# Patient Record
Sex: Female | Born: 1955 | Race: White | Hispanic: No | Smoking: Never smoker
Health system: Southern US, Community
[De-identification: ages and names within clinical notes are randomized; demographics above are authoritative.]

## PROBLEM LIST (undated history)

## (undated) DIAGNOSIS — N183 Chronic kidney disease, stage 3 (moderate): Secondary | ICD-10-CM

## (undated) DIAGNOSIS — L4050 Arthropathic psoriasis, unspecified: Secondary | ICD-10-CM

## (undated) DIAGNOSIS — R001 Bradycardia, unspecified: Secondary | ICD-10-CM

## (undated) DIAGNOSIS — F329 Major depressive disorder, single episode, unspecified: Secondary | ICD-10-CM

## (undated) DIAGNOSIS — D219 Benign neoplasm of connective and other soft tissue, unspecified: Secondary | ICD-10-CM

## (undated) DIAGNOSIS — G2581 Restless legs syndrome: Secondary | ICD-10-CM

## (undated) DIAGNOSIS — K219 Gastro-esophageal reflux disease without esophagitis: Secondary | ICD-10-CM

## (undated) DIAGNOSIS — K760 Fatty (change of) liver, not elsewhere classified: Secondary | ICD-10-CM

## (undated) DIAGNOSIS — G629 Polyneuropathy, unspecified: Secondary | ICD-10-CM

## (undated) DIAGNOSIS — I10 Essential (primary) hypertension: Secondary | ICD-10-CM

## (undated) DIAGNOSIS — D6859 Other primary thrombophilia: Secondary | ICD-10-CM

## (undated) DIAGNOSIS — M1990 Unspecified osteoarthritis, unspecified site: Secondary | ICD-10-CM

## (undated) DIAGNOSIS — D6851 Activated protein C resistance: Secondary | ICD-10-CM

## (undated) DIAGNOSIS — F32A Depression, unspecified: Secondary | ICD-10-CM

## (undated) HISTORY — DX: Other primary thrombophilia: D68.59

## (undated) HISTORY — DX: Polyneuropathy, unspecified: G62.9

## (undated) HISTORY — DX: Fatty (change of) liver, not elsewhere classified: K76.0

## (undated) HISTORY — DX: Restless legs syndrome: G25.81

## (undated) HISTORY — PX: LUMBAR SPINE SURGERY: SHX701

## (undated) HISTORY — DX: Arthropathic psoriasis, unspecified: L40.50

## (undated) HISTORY — DX: Unspecified osteoarthritis, unspecified site: M19.90

## (undated) HISTORY — PX: TUBAL LIGATION: SHX77

## (undated) HISTORY — DX: Gastro-esophageal reflux disease without esophagitis: K21.9

## (undated) HISTORY — DX: Major depressive disorder, single episode, unspecified: F32.9

## (undated) HISTORY — DX: Depression, unspecified: F32.A

## (undated) HISTORY — PX: VAGINAL HYSTERECTOMY: SUR661

## (undated) HISTORY — PX: OTHER SURGICAL HISTORY: SHX169

## (undated) HISTORY — PX: KNEE SURGERY: SHX244

## (undated) HISTORY — DX: Activated protein C resistance: D68.51

## (undated) HISTORY — DX: Essential (primary) hypertension: I10

## (undated) HISTORY — PX: CHOLECYSTECTOMY: SHX55

## (undated) HISTORY — DX: Chronic kidney disease, stage 3 (moderate): N18.3

## (undated) HISTORY — DX: Benign neoplasm of connective and other soft tissue, unspecified: D21.9

## (undated) HISTORY — PX: URETHRAL SLING: SHX2621

---

## 1998-07-08 ENCOUNTER — Ambulatory Visit (HOSPITAL_COMMUNITY): Admission: RE | Admit: 1998-07-08 | Discharge: 1998-07-08 | Payer: Self-pay | Admitting: Family Medicine

## 2000-02-11 ENCOUNTER — Encounter: Admission: RE | Admit: 2000-02-11 | Discharge: 2000-02-11 | Payer: Self-pay | Admitting: Family Medicine

## 2000-02-11 ENCOUNTER — Encounter: Payer: Self-pay | Admitting: Family Medicine

## 2003-04-12 ENCOUNTER — Ambulatory Visit (HOSPITAL_COMMUNITY): Admission: RE | Admit: 2003-04-12 | Discharge: 2003-04-12 | Payer: Self-pay | Admitting: Family Medicine

## 2003-11-20 ENCOUNTER — Emergency Department (HOSPITAL_COMMUNITY): Admission: EM | Admit: 2003-11-20 | Discharge: 2003-11-20 | Payer: Self-pay

## 2004-04-17 ENCOUNTER — Encounter: Admission: RE | Admit: 2004-04-17 | Discharge: 2004-04-17 | Payer: Self-pay | Admitting: Neurological Surgery

## 2004-05-02 ENCOUNTER — Ambulatory Visit (HOSPITAL_COMMUNITY): Admission: RE | Admit: 2004-05-02 | Discharge: 2004-05-02 | Payer: Self-pay | Admitting: Neurological Surgery

## 2005-03-30 ENCOUNTER — Emergency Department (HOSPITAL_COMMUNITY): Admission: EM | Admit: 2005-03-30 | Discharge: 2005-03-30 | Payer: Self-pay | Admitting: Family Medicine

## 2005-10-28 ENCOUNTER — Encounter: Admission: RE | Admit: 2005-10-28 | Discharge: 2005-10-28 | Payer: Self-pay | Admitting: Orthopedic Surgery

## 2006-01-25 ENCOUNTER — Ambulatory Visit (HOSPITAL_COMMUNITY): Admission: RE | Admit: 2006-01-25 | Discharge: 2006-01-26 | Payer: Self-pay | Admitting: Obstetrics & Gynecology

## 2006-01-25 ENCOUNTER — Encounter (INDEPENDENT_AMBULATORY_CARE_PROVIDER_SITE_OTHER): Payer: Self-pay | Admitting: *Deleted

## 2007-01-09 ENCOUNTER — Emergency Department (HOSPITAL_COMMUNITY): Admission: EM | Admit: 2007-01-09 | Discharge: 2007-01-09 | Payer: Self-pay | Admitting: Family Medicine

## 2007-01-10 ENCOUNTER — Emergency Department (HOSPITAL_COMMUNITY): Admission: EM | Admit: 2007-01-10 | Discharge: 2007-01-10 | Payer: Self-pay | Admitting: Emergency Medicine

## 2007-05-31 ENCOUNTER — Ambulatory Visit (HOSPITAL_COMMUNITY): Admission: RE | Admit: 2007-05-31 | Discharge: 2007-05-31 | Payer: Self-pay | Admitting: General Surgery

## 2007-06-18 ENCOUNTER — Emergency Department (HOSPITAL_COMMUNITY): Admission: EM | Admit: 2007-06-18 | Discharge: 2007-06-18 | Payer: Self-pay | Admitting: Emergency Medicine

## 2007-06-22 ENCOUNTER — Encounter: Admission: RE | Admit: 2007-06-22 | Discharge: 2007-06-22 | Payer: Self-pay | Admitting: General Surgery

## 2008-06-09 ENCOUNTER — Emergency Department (HOSPITAL_COMMUNITY): Admission: EM | Admit: 2008-06-09 | Discharge: 2008-06-09 | Payer: Self-pay | Admitting: Emergency Medicine

## 2008-09-25 ENCOUNTER — Encounter: Admission: RE | Admit: 2008-09-25 | Discharge: 2008-09-25 | Payer: Self-pay | Admitting: Family Medicine

## 2009-05-26 ENCOUNTER — Emergency Department (HOSPITAL_BASED_OUTPATIENT_CLINIC_OR_DEPARTMENT_OTHER): Admission: EM | Admit: 2009-05-26 | Discharge: 2009-05-26 | Payer: Self-pay | Admitting: Emergency Medicine

## 2009-05-26 ENCOUNTER — Ambulatory Visit: Payer: Self-pay | Admitting: Interventional Radiology

## 2009-06-14 ENCOUNTER — Encounter: Admission: RE | Admit: 2009-06-14 | Discharge: 2009-06-14 | Payer: Self-pay | Admitting: Family Medicine

## 2010-01-21 ENCOUNTER — Encounter: Admission: RE | Admit: 2010-01-21 | Discharge: 2010-01-21 | Payer: Self-pay | Admitting: Family Medicine

## 2010-02-20 ENCOUNTER — Ambulatory Visit: Payer: Self-pay | Admitting: Interventional Radiology

## 2010-02-20 ENCOUNTER — Emergency Department (HOSPITAL_BASED_OUTPATIENT_CLINIC_OR_DEPARTMENT_OTHER): Admission: EM | Admit: 2010-02-20 | Discharge: 2010-02-20 | Payer: Self-pay | Admitting: Emergency Medicine

## 2010-04-20 ENCOUNTER — Encounter: Payer: Self-pay | Admitting: Physical Medicine and Rehabilitation

## 2010-05-29 ENCOUNTER — Encounter: Payer: Self-pay | Admitting: Cardiology

## 2010-06-06 ENCOUNTER — Encounter: Payer: Self-pay | Admitting: Cardiology

## 2010-06-06 ENCOUNTER — Institutional Professional Consult (permissible substitution) (INDEPENDENT_AMBULATORY_CARE_PROVIDER_SITE_OTHER): Payer: BC Managed Care – PPO | Admitting: Cardiology

## 2010-06-06 DIAGNOSIS — M1990 Unspecified osteoarthritis, unspecified site: Secondary | ICD-10-CM | POA: Insufficient documentation

## 2010-06-06 DIAGNOSIS — G2581 Restless legs syndrome: Secondary | ICD-10-CM | POA: Insufficient documentation

## 2010-06-06 DIAGNOSIS — IMO0001 Reserved for inherently not codable concepts without codable children: Secondary | ICD-10-CM | POA: Insufficient documentation

## 2010-06-06 DIAGNOSIS — R079 Chest pain, unspecified: Secondary | ICD-10-CM | POA: Insufficient documentation

## 2010-06-06 DIAGNOSIS — I10 Essential (primary) hypertension: Secondary | ICD-10-CM | POA: Insufficient documentation

## 2010-06-06 DIAGNOSIS — J309 Allergic rhinitis, unspecified: Secondary | ICD-10-CM | POA: Insufficient documentation

## 2010-06-06 DIAGNOSIS — R0602 Shortness of breath: Secondary | ICD-10-CM

## 2010-06-06 DIAGNOSIS — F3289 Other specified depressive episodes: Secondary | ICD-10-CM | POA: Insufficient documentation

## 2010-06-06 DIAGNOSIS — K7689 Other specified diseases of liver: Secondary | ICD-10-CM | POA: Insufficient documentation

## 2010-06-06 DIAGNOSIS — R609 Edema, unspecified: Secondary | ICD-10-CM | POA: Insufficient documentation

## 2010-06-06 DIAGNOSIS — K219 Gastro-esophageal reflux disease without esophagitis: Secondary | ICD-10-CM | POA: Insufficient documentation

## 2010-06-06 DIAGNOSIS — F329 Major depressive disorder, single episode, unspecified: Secondary | ICD-10-CM | POA: Insufficient documentation

## 2010-06-10 ENCOUNTER — Telehealth: Payer: Self-pay | Admitting: Cardiology

## 2010-06-10 NOTE — Assessment & Plan Note (Signed)
Summary: consult: left sided - rib pain. pt has bcbs. per ingrid  note...   Visit Type:  Initial Consult Primary Provider:  Dr. Laurann Montana  CC:  Dyspnea.  History of Present Illness: The patient presents for evaluation of dyspnea.She had an extensive evaluation last year. I reviewed this. She had an echocardiogram which demonstrated by report no gout no use and normal left ventricular function. There was some mild asymmetric septal hypertrophy. A stress perfusion study was reported as normal. She was treated with a higher dose of diuretic and had initial improvement in dyspnea and edema. However, she subsequently has had a return of this complaint over the past week or so.  past she has increased dyspnea with activities and is resting pushing a cart a box at work. She has also had some increased lower extremity swelling although she does admit to not restricting salt or fluids and to keeping her feet down. She is not however describing any chest pressure, neck or arm discomfort. She is not having any palpitations, presyncope or syncope. She is not having PND or orthopnea. He denies any cough fevers or chills.  Current Medications (verified): 1)  Furosemide 40 Mg Tabs (Furosemide) .Marland Kitchen.. 1 By Mouth Daily 2)  Klor-Con M20 20 Meq Cr-Tabs (Potassium Chloride Crys Cr) .Marland Kitchen.. 1 By Mouth Two Times A Day 3)  Lisinopril-Hydrochlorothiazide 20-12.5 Mg Tabs (Lisinopril-Hydrochlorothiazide) .Marland Kitchen.. 1 By Mouth Dialy 4)  Multivitamins   Tabs (Multiple Vitamin) .Marland Kitchen.. 1 By Mouth Daily 5)  Calcium 500 Mg Tabs (Calcium) .Marland Kitchen.. 1 By Mouth Daily 6)  Protonix 40 Mg Tbec (Pantoprazole Sodium) .Marland Kitchen.. 1 By Mouth Daily 7)  Requip 3 Mg Tabs (Ropinirole Hcl) .Marland Kitchen.. 1 By Mouth Daily 8)  Vitamin D 1000 Unit Tabs (Cholecalciferol) .Marland Kitchen.. 1 By Mouth Dialy 9)  Prozac .... Uad 10)  Prednisone 5 Mg Tabs (Prednisone) .Marland Kitchen.. 1 1/2 By Mouth Daily  Allergies (verified): No Known Drug Allergies  Past History:  Past Medical  History: Depression Hypertension Allergis rhinitis dysfunction uterine bleeding/fibroids GERD RLS Osteoarthritis- bilateral hips, thoracic spine Fatty liver Temporal Arteritis Myofacial pain syndrome  Past Surgical History: Rectum fistula repair Bilateral tubaligation Lumbar surgery Laparascopic cholecystectomy Hysterectomy with bilateral oophorectomy Urethral sling Arthroscopic right knee surgery  Family History: Father deceased CAD in mid 61's, TIAs in mid 14's Mother: alive hypertension , temporal arteritis, osteoporosis, peripheral neuropathy, DVT in 62's  Social History: Patient is a Chief Financial Officer. He is married with 2 children. She does not smoke cigarettes or drink alcohol.  Review of Systems       Positive for headaches and reflux. Otherwise as stated in the history of present illness negative for all other systems.  Vital Signs:  Patient profile:   55 year old female Height:      65 inches Weight:      260 pounds BMI:     43.42 Pulse rate:   105 / minute Resp:     16 per minute BP sitting:   119 / 71  (left arm)  Vitals Entered By: Marrion Coy, CNA (June 06, 2010 2:52 PM)  Physical Exam  General:  Well developed, well nourished, in no acute distress. Head:  normocephalic and atraumatic Eyes:  PERRLA/EOM intact; conjunctiva and lids normal. Mouth:  Teeth, gums and palate normal. Oral mucosa normal. Neck:  Neck supple, no JVD. No masses, thyromegaly or abnormal cervical nodes. Chest Wall:  no deformities or breast masses noted Lungs:  Clear bilaterally to auscultation and percussion. Abdomen:  Bowel sounds positive; abdomen soft and non-tender without masses, organomegaly, or hernias noted. No hepatosplenomegaly, obese Msk:  Back normal, normal gait. Muscle strength and tone normal. Extremities:  No clubbing or cyanosis. Neurologic:  Alert and oriented x 3. Skin:  Intact without lesions or rashes. Cervical Nodes:  no significant  adenopathy Inguinal Nodes:  no significant adenopathy Psych:  Normal affect.   Detailed Cardiovascular Exam  Neck    Carotids: Carotids full and equal bilaterally without bruits.      Neck Veins: Normal, no JVD.    Heart    Inspection: no deformities or lifts noted.      Palpation: normal PMI with no thrills palpable.      Auscultation: regular rate and rhythm, S1, S2 without murmurs, rubs, gallops, or clicks.    Vascular    Abdominal Aorta: no palpable masses, pulsations, or audible bruits.      Femoral Pulses: normal femoral pulses bilaterally.      Pedal Pulses: normal pedal pulses bilaterally.      Radial Pulses: normal radial pulses bilaterally.      Peripheral Circulation: no clubbing, cyanosis, or edema noted with normal capillary refill.     EKG  Procedure date:  06/06/2010  Findings:      Sinus rhythm, rate 102, axis within normal limits, intervals within normal limits, no acute ST-T wave changes.  Impression & Recommendations:  Problem # 1:  DYSPNEA (ICD-786.05) The patient most likely has some component of diastolic dysfunction and some component related to her weight and deconditioning. There is no indication or need for further echocardiography or stress testing. We did discuss in great length conservative measures to include salt and fluid restriction. I gave her prescription for knee-high compression stockings and discussed how she should keep her feet elevated. I discussed weight loss. I will have her take an extra 40 mg of Lasix for 2 days with an X. 20 mEq of potassium. I will have her come back early next week for a basic metabolic profile and BNP  Problem # 2:  EDEMA (ICD-782.3) This will be treated as above.  Problem # 3:  MORBID OBESITY (ICD-278.01) I prescribed the [employer].  Other Orders: EKG w/ Interpretation (93000)  Patient Instructions: 1)  Your physician recommends that you schedule a follow-up appointment in: 6 weeks with Dr.  Antoine Poche 2)  Your physician recommends that you return for lab work next week for a bmp, bnp 786.50/dfg 3)  Your physician has recommended you make the following change in your medication: Take 1 furosemide twice a day for 2 days and take an extra potassium for 2 days Then back to daily as before. 4)  compression stockings Prescriptions: FUROSEMIDE 40 MG TABS (FUROSEMIDE) 1 tablet daily Take an extra furosemide today and tomorrow  #92 x 3   Entered by:   Lisabeth Devoid RN   Authorized by:   Rollene Rotunda, MD, [facility]   Signed by:   Lisabeth Devoid RN on 06/06/2010   Method used:   Electronically to        [facility] (retail)       [address]       [place], [place]  [address]       Ph: [phone]       Fax: [phone]   RxID:   [id number] KLOR-CON M20 20 MEQ CR-TABS (POTASSIUM CHLORIDE CRYS CR) 1 by mouth two times a day  Take an extra potassium for 2 days  #182 x 3   Entered by:   Lisabeth Devoid RN   Authorized by:   Rollene Rotunda, MD, [facility]   Signed by:   Lisabeth Devoid RN on 06/06/2010   Method used:   Electronically to        [facility] (retail)       [address]       [place], [place]  [address]       Ph: [phone]       Fax: [phone]   RxID:   [id number]  I have reviewed and approved all prescriptions at the time of this visit. Rollene Rotunda, MD, [facility]  June 06, 2010 6:32 PM

## 2010-06-11 ENCOUNTER — Other Ambulatory Visit: Payer: BC Managed Care – PPO

## 2010-06-17 NOTE — Progress Notes (Signed)
Summary: c/o dizziness lasix was on hold sat/ mon  Phone Note Call from Patient Call back at [facility] Phone [phone]   Caller: Patient ext [phone] Reason for Call: Talk to Nurse Summary of Call: c/o dizziness, weak. lasix is increase. pt stop lasix on sat/ sun/ mon/ tues.   Initial call taken by: Lorne Skeens,  June 10, 2010 10:04 AM  Follow-up for Phone Call        spoke with pt who states she has been having dizziness and weakness since Saturday.  Her BP has been running in the low 100/low 60's since then.  Pt has not taken Furosemide since Saturday d/t dizziness.  SHe has not been wearing her compression stockings as ordered.  In fact she has not purchased them as of yet.  Strongly encoaraged pt to obtain and wear compression stockings as ordered.  Information given as to where pt can purchase stockings close to her work as she is not able to  get to [place] during the week to buy them.  Instructed pt to conitnue to hold Furosemide as long as she is symptomatic and not swelling but that she needed to get back on it as soon as possible.  Also instructed pt to call back after she has been wearing compression stockings as ordered if there is no improvement.  SHe states understanding and is in agreement. Follow-up by: Charolotte Capuchin, RN,  June 10, 2010 10:37 AM

## 2010-06-19 LAB — DIFFERENTIAL
Basophils Relative: 0 % (ref 0–1)
Eosinophils Absolute: 0 10*3/uL (ref 0.0–0.7)
Lymphs Abs: 1.6 10*3/uL (ref 0.7–4.0)
Monocytes Absolute: 0.2 10*3/uL (ref 0.1–1.0)
Monocytes Relative: 2 % — ABNORMAL LOW (ref 3–12)
Neutrophils Relative %: 83 % — ABNORMAL HIGH (ref 43–77)

## 2010-06-19 LAB — CBC
HCT: 40 % (ref 36.0–46.0)
Hemoglobin: 13.5 g/dL (ref 12.0–15.0)
MCHC: 33.7 g/dL (ref 30.0–36.0)
MCV: 92.1 fL (ref 78.0–100.0)
RBC: 4.34 MIL/uL (ref 3.87–5.11)
WBC: 10.6 10*3/uL — ABNORMAL HIGH (ref 4.0–10.5)

## 2010-06-19 LAB — POCT CARDIAC MARKERS
Myoglobin, poc: 69.4 ng/mL (ref 12–200)
Troponin i, poc: <0.05 ng/mL (ref 0.00–0.09)
Troponin i, poc: <0.05 ng/mL (ref 0.00–0.09)

## 2010-06-19 LAB — BASIC METABOLIC PANEL
CO2: 28 mEq/L (ref 19–32)
Chloride: 104 mEq/L (ref 96–112)
Creatinine, Ser: 1 mg/dL (ref 0.4–1.2)
GFR calc Af Amer: >60 mL/min (ref >60)
Potassium: 3.1 mEq/L — ABNORMAL LOW (ref 3.5–5.1)
Sodium: 141 mEq/L (ref 135–145)

## 2010-06-23 ENCOUNTER — Telehealth: Payer: Self-pay | Admitting: Cardiology

## 2010-06-23 DIAGNOSIS — I10 Essential (primary) hypertension: Secondary | ICD-10-CM

## 2010-06-23 DIAGNOSIS — R0602 Shortness of breath: Secondary | ICD-10-CM

## 2010-06-23 DIAGNOSIS — R609 Edema, unspecified: Secondary | ICD-10-CM

## 2010-06-23 NOTE — Telephone Encounter (Signed)
SPOKE WITH PT AND SET UP LABS--BMET/BNP--NT

## 2010-06-23 NOTE — Telephone Encounter (Signed)
Pt wants to have blood work done before her appt in April. Pt [phone] ext [phone]

## 2010-06-24 ENCOUNTER — Other Ambulatory Visit (INDEPENDENT_AMBULATORY_CARE_PROVIDER_SITE_OTHER): Payer: BC Managed Care – PPO | Admitting: *Deleted

## 2010-06-24 DIAGNOSIS — I10 Essential (primary) hypertension: Secondary | ICD-10-CM

## 2010-06-24 DIAGNOSIS — R0602 Shortness of breath: Secondary | ICD-10-CM

## 2010-06-24 DIAGNOSIS — R609 Edema, unspecified: Secondary | ICD-10-CM

## 2010-06-24 LAB — BASIC METABOLIC PANEL
BUN: 20 mg/dL (ref 6–23)
CO2: 29 mEq/L (ref 19–32)
Calcium: 8.9 mg/dL (ref 8.4–10.5)
Chloride: 104 mEq/L (ref 96–112)
Creatinine, Ser: 1.2 mg/dL (ref 0.4–1.2)

## 2010-06-25 ENCOUNTER — Other Ambulatory Visit: Payer: BC Managed Care – PPO | Admitting: *Deleted

## 2010-06-25 NOTE — Progress Notes (Signed)
Pt aware of results and aware of new orders.  She will repeat labs Tuesday of next week

## 2010-07-01 ENCOUNTER — Other Ambulatory Visit (INDEPENDENT_AMBULATORY_CARE_PROVIDER_SITE_OTHER): Payer: BC Managed Care – PPO | Admitting: *Deleted

## 2010-07-01 DIAGNOSIS — Z79899 Other long term (current) drug therapy: Secondary | ICD-10-CM

## 2010-07-01 DIAGNOSIS — I10 Essential (primary) hypertension: Secondary | ICD-10-CM

## 2010-07-02 ENCOUNTER — Other Ambulatory Visit: Payer: BC Managed Care – PPO | Admitting: *Deleted

## 2010-07-02 LAB — BASIC METABOLIC PANEL
BUN: 17 mg/dL (ref 6–23)
Calcium: 9.1 mg/dL (ref 8.4–10.5)
GFR: 58.66 mL/min — ABNORMAL LOW (ref >60.00)
Glucose, Bld: 93 mg/dL (ref 70–99)

## 2010-07-10 LAB — POCT URINALYSIS DIP (DEVICE)
Glucose, UA: NEGATIVE mg/dL
Nitrite: NEGATIVE
Protein, ur: NEGATIVE mg/dL
Urobilinogen, UA: 0.2 mg/dL (ref 0.0–1.0)

## 2010-07-14 ENCOUNTER — Ambulatory Visit (INDEPENDENT_AMBULATORY_CARE_PROVIDER_SITE_OTHER): Payer: BC Managed Care – PPO | Admitting: Cardiology

## 2010-07-14 ENCOUNTER — Encounter: Payer: Self-pay | Admitting: Cardiology

## 2010-07-14 VITALS — BP 133/88 | HR 88 | Resp 18 | Ht 64.0 in | Wt 263.8 lb

## 2010-07-14 DIAGNOSIS — I10 Essential (primary) hypertension: Secondary | ICD-10-CM

## 2010-07-14 DIAGNOSIS — R609 Edema, unspecified: Secondary | ICD-10-CM

## 2010-07-14 LAB — BASIC METABOLIC PANEL
CO2: 28 mEq/L (ref 19–32)
Calcium: 8.8 mg/dL (ref 8.4–10.5)
Chloride: 102 mEq/L (ref 96–112)
Creatinine, Ser: 1 mg/dL (ref 0.4–1.2)
Sodium: 140 mEq/L (ref 135–145)

## 2010-07-14 NOTE — Assessment & Plan Note (Signed)
We are treating this conservatively and no change in therapy is indicated.

## 2010-07-14 NOTE — Progress Notes (Signed)
HPI The patient presents for followup of edema and dyspnea. This was felt to be related to deconditioning and weight. BNP was in the 20s recently. She was treated with symmetric diuretic for a short period and is now wearing compression stockings. Her breathing is slightly better and her edema is better. She has lost 4 pounds. She is having no new chest discomfort, neck or arm discomfort. She is having no new PND or orthopnea. She is having no new palpitations, presyncope or syncope.  No Known Allergies  Current Outpatient Prescriptions  Medication Sig Dispense Refill  . calcium gluconate 500 MG tablet Take 500 mg by mouth daily.        . Cholecalciferol (VITAMIN D) 1000 UNITS capsule Take 1,000 Units by mouth daily.        Marland Kitchen FLUoxetine (PROZAC) 10 MG capsule Take 20 mg by mouth daily.       . furosemide (LASIX) 40 MG tablet Take 40 mg by mouth daily.        Marland Kitchen lisinopril-hydrochlorothiazide (PRINZIDE,ZESTORETIC) 20-12.5 MG per tablet Take 1 tablet by mouth daily.        . Multiple Vitamin (MULTIVITAMIN) tablet Take 1 tablet by mouth daily.        . pantoprazole (PROTONIX) 40 MG tablet Take 40 mg by mouth daily.        . potassium chloride SA (K-DUR,KLOR-CON) 20 MEQ tablet Take 20 mEq by mouth 2 (two) times daily.        Marland Kitchen rOPINIRole (REQUIP) 3 MG tablet Take 3 mg by mouth at bedtime.        . predniSONE (DELTASONE) 5 MG tablet Take 5 mg by mouth. Take 1 1/2 tablet daily         Past Medical History  Diagnosis Date  . Depression   . Hypertension   . Allergic rhinitis   . Fibroids     dysfuction uterine bleeding  . GERD (gastroesophageal reflux disease)   . RLS (restless legs syndrome)   . Osteoarthritis   . Fatty liver   . Hx of hysterectomy     Past Surgical History  Procedure Date  . Rectu   . Tubal ligation     bilateral  . Lumbar spine surgery   . Cholecystectomy   . Urethral sling   . Knee surgery     right   ROS: As stated in the HPI and negative for all other  systems.  PHYSICAL EXAM BP 133/88  Pulse 88  Resp 18  Ht 5\' 4"  (1.626 m)  Wt 263 lb 12.8 oz (119.659 kg)  BMI 45.28 kg/m2 GENERAL:  Well appearing HEENT:  Pupils equal round and reactive, fundi not visualized, oral mucosa unremarkable NECK:  No jugular venous distention, waveform within normal limits, carotid upstroke brisk and symmetric, no bruits, no thyromegaly LYMPHATICS:  No cervical, inguinal adenopathy LUNGS:  Clear to auscultation bilaterally BACK:  No CVA tenderness CHEST:  Unremarkable HEART:  PMI not displaced or sustained,S1 and S2 within normal limits, no S3, no S4, no clicks, no rubs, no murmurs ABD:  Flat, positive bowel sounds normal in frequency in pitch, no bruits, no rebound, no guarding, no midline pulsatile mass, no hepatomegaly, no splenomegaly EXT:  2 plus pulses throughout, mild edema, no cyanosis no clubbing SKIN:  No rashes no nodules NEURO:  Cranial nerves II through XII grossly intact, motor grossly intact throughout PSYCH:  Cognitively intact, oriented to person place and time  EKG:  Sinus rhythm, rate 85, axis  within normal limits, intervals within normal limits, no acute ST-T wave changes  ASSESSMENT AND PLAN

## 2010-07-14 NOTE — Assessment & Plan Note (Signed)
The patient understands the need to lose weight with diet and exercise. We have discussed specific strategies for this.  

## 2010-07-14 NOTE — Patient Instructions (Signed)
Your physician recommends that you have lab work today: [facility]  Your physician recommends that you schedule a follow-up appointment as needed.  Your physician recommends that you continue on your current medications as directed. Please refer to the Current Medication list given to you today.

## 2010-07-14 NOTE — Assessment & Plan Note (Signed)
The blood pressure is at target. No change in medications is indicated. We will continue with therapeutic lifestyle changes (TLC).  

## 2010-07-15 ENCOUNTER — Telehealth: Payer: Self-pay | Admitting: Cardiology

## 2010-07-15 NOTE — Telephone Encounter (Signed)
Left message for pt to call back  °

## 2010-07-15 NOTE — Telephone Encounter (Signed)
Pt calling back re message left with husband yesterday re some orders she needs due to potassium being low

## 2010-07-17 NOTE — Telephone Encounter (Signed)
Pt's husband aware of instructions and an appointment was made for pt to repeat labs 07/24/2010.  Husband verbalized understanding of all changes and need for lab work

## 2010-07-24 ENCOUNTER — Other Ambulatory Visit (INDEPENDENT_AMBULATORY_CARE_PROVIDER_SITE_OTHER): Payer: BC Managed Care – PPO | Admitting: *Deleted

## 2010-07-24 DIAGNOSIS — I10 Essential (primary) hypertension: Secondary | ICD-10-CM

## 2010-07-24 LAB — BASIC METABOLIC PANEL
BUN: 15 mg/dL (ref 6–23)
Chloride: 102 mEq/L (ref 96–112)
GFR: 54.94 mL/min — ABNORMAL LOW (ref >60.00)
Potassium: 3.3 mEq/L — ABNORMAL LOW (ref 3.5–5.1)
Sodium: 139 mEq/L (ref 135–145)

## 2010-07-28 ENCOUNTER — Telehealth: Payer: Self-pay | Admitting: *Deleted

## 2010-07-28 DIAGNOSIS — I10 Essential (primary) hypertension: Secondary | ICD-10-CM

## 2010-07-28 NOTE — Progress Notes (Signed)
Left message for pt to call to schedule appt for lab work

## 2010-07-29 NOTE — Telephone Encounter (Signed)
complete

## 2010-07-31 ENCOUNTER — Emergency Department (HOSPITAL_COMMUNITY)
Admission: EM | Admit: 2010-07-31 | Discharge: 2010-07-31 | Disposition: A | Discharge status: Home / Self Care | Payer: BC Managed Care – PPO | Attending: Emergency Medicine | Admitting: Emergency Medicine

## 2010-07-31 ENCOUNTER — Other Ambulatory Visit (INDEPENDENT_AMBULATORY_CARE_PROVIDER_SITE_OTHER): Payer: BC Managed Care – PPO | Admitting: *Deleted

## 2010-07-31 DIAGNOSIS — K219 Gastro-esophageal reflux disease without esophagitis: Secondary | ICD-10-CM | POA: Insufficient documentation

## 2010-07-31 DIAGNOSIS — K625 Hemorrhage of anus and rectum: Secondary | ICD-10-CM | POA: Insufficient documentation

## 2010-07-31 DIAGNOSIS — R197 Diarrhea, unspecified: Secondary | ICD-10-CM | POA: Insufficient documentation

## 2010-07-31 DIAGNOSIS — I10 Essential (primary) hypertension: Secondary | ICD-10-CM

## 2010-07-31 DIAGNOSIS — R000 Tachycardia, unspecified: Secondary | ICD-10-CM | POA: Insufficient documentation

## 2010-07-31 DIAGNOSIS — F3289 Other specified depressive episodes: Secondary | ICD-10-CM | POA: Insufficient documentation

## 2010-07-31 DIAGNOSIS — M129 Arthropathy, unspecified: Secondary | ICD-10-CM | POA: Insufficient documentation

## 2010-07-31 DIAGNOSIS — Z79899 Other long term (current) drug therapy: Secondary | ICD-10-CM | POA: Insufficient documentation

## 2010-07-31 DIAGNOSIS — K644 Residual hemorrhoidal skin tags: Secondary | ICD-10-CM | POA: Insufficient documentation

## 2010-07-31 DIAGNOSIS — F329 Major depressive disorder, single episode, unspecified: Secondary | ICD-10-CM | POA: Insufficient documentation

## 2010-07-31 LAB — COMPREHENSIVE METABOLIC PANEL
ALT: 11 U/L (ref 0–35)
AST: 21 U/L (ref 0–37)
BUN: 13 mg/dL (ref 6–23)
CO2: 26 mEq/L (ref 19–32)
Calcium: 9.4 mg/dL (ref 8.4–10.5)
Chloride: 110 mEq/L (ref 96–112)
Creatinine, Ser: 1.02 mg/dL (ref 0.4–1.2)
Creatinine, Ser: 1.12 mg/dL (ref 0.4–1.2)
GFR calc Af Amer: >60 mL/min (ref >60)
GFR calc non Af Amer: 50 mL/min — ABNORMAL LOW (ref >60)
Glucose, Bld: 92 mg/dL (ref 70–99)
Glucose, Bld: 99 mg/dL (ref 70–99)
Total Bilirubin: 0.1 mg/dL — ABNORMAL LOW (ref 0.3–1.2)
Total Protein: 6.2 g/dL (ref 6.0–8.3)

## 2010-07-31 LAB — CBC
HCT: 37.7 % (ref 36.0–46.0)
Hemoglobin: 12.6 g/dL (ref 12.0–15.0)
MCH: 30.3 pg (ref 26.0–34.0)
MCHC: 33.4 g/dL (ref 30.0–36.0)
RBC: 4.16 MIL/uL (ref 3.87–5.11)

## 2010-07-31 LAB — URINALYSIS, ROUTINE W REFLEX MICROSCOPIC
Bilirubin Urine: NEGATIVE
Ketones, ur: NEGATIVE mg/dL
Nitrite: NEGATIVE
Urobilinogen, UA: 0.2 mg/dL (ref 0.0–1.0)

## 2010-07-31 LAB — DIFFERENTIAL
Basophils Relative: 0 % (ref 0–1)
Lymphocytes Relative: 29 % (ref 12–46)
Lymphs Abs: 2.3 10*3/uL (ref 0.7–4.0)
Monocytes Absolute: 0.6 10*3/uL (ref 0.1–1.0)
Monocytes Relative: 7 % (ref 3–12)
Neutro Abs: 5.1 10*3/uL (ref 1.7–7.7)
Neutrophils Relative %: 63 % (ref 43–77)

## 2010-07-31 LAB — BASIC METABOLIC PANEL
CO2: 25 mEq/L (ref 19–32)
Chloride: 102 mEq/L (ref 96–112)
Creatinine, Ser: 1 mg/dL (ref 0.4–1.2)
Potassium: 3 mEq/L — ABNORMAL LOW (ref 3.5–5.1)
Sodium: 137 mEq/L (ref 135–145)

## 2010-08-01 ENCOUNTER — Telehealth: Payer: Self-pay | Admitting: Cardiology

## 2010-08-01 DIAGNOSIS — I10 Essential (primary) hypertension: Secondary | ICD-10-CM

## 2010-08-01 DIAGNOSIS — Z79899 Other long term (current) drug therapy: Secondary | ICD-10-CM

## 2010-08-01 NOTE — Telephone Encounter (Signed)
Pt calling wanting explanation for potassium level going up and down?--do I have to stay on this high dose of K forever?--why does it keep going down with me taking it everyday faithfully?--advised I would have dr hochrein or pam call her with answers to these questions--nt

## 2010-08-01 NOTE — Telephone Encounter (Signed)
LMTCB--nt 

## 2010-08-01 NOTE — Telephone Encounter (Signed)
Pt went to ER yesterday because of bleeding.  Pot was 4.0 at hospital, When lab was taken at our office potassium but pt had not taken medication that am.  Has question regarding the accuracy of the labs.

## 2010-08-02 NOTE — Telephone Encounter (Signed)
The patient will need to be on KCl.  This will likely be a requirement while she is on diuretic.  We can discontinue the HCTZ.  She will still need close follow up of her potassium.  Is her diet varying in the amount of potassium that she consumes?

## 2010-08-04 NOTE — Telephone Encounter (Signed)
When should pt repeat BMP?

## 2010-08-04 NOTE — Telephone Encounter (Signed)
Left message for pt of Dr Hochrein's instructions.  Asked pt to call back with questions.  I will ask Dr Antoine Poche when he wants to re-check KCL level on pt.

## 2010-08-04 NOTE — Telephone Encounter (Signed)
Repeat in one month.

## 2010-08-05 NOTE — Telephone Encounter (Signed)
Left message for pt to repeat BMP in 1 month

## 2010-08-07 ENCOUNTER — Inpatient Hospital Stay (INDEPENDENT_AMBULATORY_CARE_PROVIDER_SITE_OTHER)
Admission: RE | Admit: 2010-08-07 | Discharge: 2010-08-07 | Disposition: A | Discharge status: Home / Self Care | Payer: BC Managed Care – PPO | Source: Ambulatory Visit | Attending: Family Medicine | Admitting: Family Medicine

## 2010-08-07 DIAGNOSIS — M545 Low back pain: Secondary | ICD-10-CM

## 2010-08-07 LAB — POCT URINALYSIS DIP (DEVICE)
Glucose, UA: NEGATIVE mg/dL
Protein, ur: 30 mg/dL — AB
Specific Gravity, Urine: 1.025 (ref 1.005–1.030)

## 2010-08-15 NOTE — Op Note (Signed)
NAMECATHY, CROUNSE NO.:  [id number]   MEDICAL RECORD NO.:  [id number]          PATIENT TYPE:  OIB   LOCATION:  2899                         FACILITY:  [facility]   PHYSICIAN:  Tia Alert, MD     DATE OF BIRTH:  1955/10/30   DATE OF PROCEDURE:  05/02/2004  DATE OF DISCHARGE:                                 OPERATIVE REPORT   REFERRING PHYSICIAN:  Stacie Acres. Cliffton Asters, M.D.   PREOPERATIVE DIAGNOSIS:  Lumbar spinal stenosis at L4-5 with right leg pain.   POSTOPERATIVE DIAGNOSIS:  Lumbar spinal stenosis at L4-5 with right leg  pain.   PROCEDURES:  Decompressive lumbar hemilaminectomy, medial facetectomy and  foraminotomy L4-5 on the right for lateral recess and L5 nerve root  decompression.   SURGEON:  Tia Alert, M.D.   ASSISTANT:  Kathaleen Maser. Pool, M.D.   ANESTHESIA:  General endotracheal.   COMPLICATIONS:  None apparent.   INDICATIONS FOR PROCEDURE:  Ms. Filla is a  55 year old white female who  was referred with back and right leg pain and L5 distribution. She had an  MRI and CT myelogram which showed some lateral recess stenosis at L4-5 on  the right side with compression of the right L5 nerve root.  I recommended a  hemilaminectomy for decompression of the nerve roots.  She understood the  risks, benefits and alternatives and  which wished to proceed.   DESCRIPTION OF PROCEDURE:  The patient is taken to the operating room after  induction of adequate generalized endotracheal anesthesia, she was rolled  into the prone position on the Wilson frame and all pressure points were  padded. Her lumbar region was prepped with DuraPrep and draped in the usual  sterile fashion. There were 10 mL of local anesthesia injected and then a  dorsal midline incision was made and carried down to the lumbosacral fascia.  The fascia on the right side was taken down.  It was opened in the  paraspinous musculature which was taken down in a subperiosteal fashion to  expose  L4-5 interspace on the right. Intraoperative x-ray confirmed my level  and a combination of the Kerrison punch and the high-speed drill was used to  perform a hemilaminectomy, medial facetectomy and foraminotomy at L4-5 on  the right side. The yellow ligament was identified, opened and removed to  expose the underlying dura and L5 nerve root.  The lateral recess was  decompressed. There was significant overgrown yellow ligament pressing on  the nerve root which was removed.  I dissected out to the medial pedicle  wall and I then used coronary dilator to fell along the pedicle to make sure  there was adequate decompression L5 nerve root.  Once the depression was  complete, I irrigated with copious amounts of bacitracin containing saline  solution, lined the dura with Gelfoam, dried all bleeding points and closed  the fascia with interrupted #1 Vicryl, closed subcutaneous and subcuticular  tissue with 2-0 and 3-0 Vicryl and closed the skin  with Dermabond. The drapes removed. The patient was awakened from general  anesthesia and transferred to the  recovery room in stable condition.  At the  end of the procedure all sponge, needle and instrument counts were correct.      DSJ/MEDQ  D:  05/02/2004  T:  05/02/2004  Job:  [id number]   cc:   Stacie Acres. White, M.D.  [address]  [place]  [place] [address]  Fax: [phone]

## 2010-08-15 NOTE — Op Note (Signed)
[name] [name], [name] [name]             ACCOUNT NO.:  [id number]   MEDICAL RECORD NO.:  [id number]          PATIENT TYPE:  OIB   LOCATION:  9320                          FACILITY:  [facility]   PHYSICIAN:  Genia Del, M.D.DATE OF BIRTH:  28-Nov-1955   DATE OF PROCEDURE:  01/25/2006  DATE OF DISCHARGE:                                 OPERATIVE REPORT   PREOPERATIVE DIAGNOSIS:  Menometrorrhagia with uterine myomas and  dysmenorrhea.   POSTOPERATIVE DIAGNOSIS:  Menometrorrhagia with uterine myomas and  dysmenorrhea.   PROCEDURE:  Laparoscopy-assisted vaginal hysterectomy plus bilateral  salpingo-oophorectomy.   SURGEON:  Genia Del, M.D.   ASSISTANT:  Lenoard Aden, M.D.   ANESTHESIOLOGIST:  Cristela Blue, M.D.   PROCEDURE:  Under general anesthesia with endotracheal intubation, the  patient is in the lithotomy position.  She was prepped already over  abdominal suprapubic vulvar and vaginal areas and draped.  A bladder  catheter was already in place, given that the patient had a sling procedure  done first by Dr. Retta Diones and a lap cholecystectomy by Dr. Johna Sheriff.  The  trocars were left in place from the lap cholecystectomy, and the  infraumbilical trocar was used for the LAVH/BSO.  So, vaginally, a speculum  was introduced.  A tenaculum was used to grasp the anterior lip of the  cervix.  We then did a hysteromenometry that came back at 9 cm.  Hegar  dilators were used to dilate the cervix for introduction of the zoomie.  The  zoomie was introduced easily.  The balloon was inflated.  The tenaculum and  speculum were then removed.  The camera was introduced at the infraumbilical  trocar, and the patient was placed in Trendelenburg.  The uterus is  anteverted with a large posterior intramural fibroid.  The left ovary is  normal in size and appearance.  The right ovary is normal in size and  appearance.  A paratubal cyst is present on that adnexa.  Both tubes have  had  previous tubal sterilization.  We infiltrated the subcutaneous tissue on  the left and right iliac sides with Marcaine one-quarter plain.  We then  made a 5 mm incision with a scalpel on each side and introduced 5 mm trocars  at both levels.  An atraumatic clamp and the gyrus were used to proceed with  the surgery.  We started on the left side.  We cauterized and sectioned the  left round ligament.  We then cauterized and sectioned the left  infundibulopelvic ligament, staying very close to the ovary because of  difficult visualization of the ureter.  We then went down along the lateral  side of the uterus and cauterized in sections, stopping before the left  uterine artery.  We opened the visceroperitoneum over the lower uterine  segment, and we recline the bladder downward.  We proceeded exactly the same  way on the right side.  We then assured good hemostasis at all levels.  We  removed the instruments, put a drape on the abdomen, and went to  vaginal  time .  The legs were positioned upward.  We  removed the zoomie and grasped  the cervix with two Hulka tenaculums.  We infiltrated all around the cervix  at the junction of his vagina with vasopressin 20 and 50.  We then sectioned  that level with the actual cautery.  We opened the peritoneum sterilely  first and put a weighted speculum at that level.  We then used the Heaney  curved clamp to clamp the left uterosacral and cardinal ligaments.  We  sectioned with Mayo scissors and put a Heaney stitch of 0 Vicryl that we  kept on a hemostat.  We did the same thing on the right side.  We then  opened the anterior peritoneum and put the retractor on that level to pull  the bladder away from the surgical area.  We used the Ligasure to cauterize  and then sectioned with Metzenbaum scissors at the level of the right  uterine artery and then the left uterine artery.  We then reached on each  lateral aspect of the uterus, the area that we had  finished with laparoscopy  that freed the uterus and both ovaries completely.  The specimen was  therefore removed and sent to pathology.  We completed hemostasis with a  Vicryl 3-0 on the left side at the level of the vaginal vault.  We then  closed the posterior vaginal vault with a locked running suture of 0 Vicryl.  Hemostasis being adequate, we closed the vagina from anteriorly to posterior  with X-stitches of 0 Vicryl.  We attached the two uterosacral ligaments on  the midline before finishing, to close the vagina.  Hemostasis was good at  all levels.  Packing with esterase was put into the vagina.  We then went  back to laparoscopy time, irrigated and suctioned the abdominal and pelvic  cavity, verified hemostasis at all levels.  It was completed on the right  vaginal vault with the gyrus very superficially.  Hemostasis being good at  all levels, we removed the instruments, evacuated the CO2.  The aponeurosis  was closed with a suture already in place at the infraumbilical incision.  We closed the skin with a subcuticular stitch of Monocryl 4-0 at the  infraumbilical incision and at the upper incision used for cholecystectomy.  We then put Dermabond on all other incisions.  Hemostasis was adequate at  all levels.  The estimated blood loss was 250 cc overall.  No complications  occurred, and the patient was brought to the recovery room in good status.  Note that an anterior repair was not done because the sling procedure suture  line was almost reaching the vaginal vault, and the support of the vagina  was good with the uterosacral ligaments, correcting very well the cystocele.      Genia Del, M.D.  Electronically Signed     ML/MEDQ  D:  01/25/2006  T:  01/25/2006  Job:  [id number]

## 2010-08-15 NOTE — Op Note (Signed)
[name], [name]             ACCOUNT NO.:  [id number]   MEDICAL RECORD NO.:  [id number]          PATIENT TYPE:  AMB   LOCATION:  SDC                           FACILITY:  [facility]   PHYSICIAN:  Sharlet Salina T. Hoxworth, M.D.DATE OF BIRTH:  26-Jun-1955   DATE OF PROCEDURE:  01/25/2006  DATE OF DISCHARGE:                               OPERATIVE REPORT   PRE AND POSTOPERATIVE DIAGNOSIS:  Biliary dyskinesia/acalculous  cholecystitis.   SURGICAL PROCEDURES:  Laparoscopic cholecystectomy with intraoperative  cholangiogram.   SURGEON:  Sharlet Salina T. Hoxworth, M.D.   ANESTHESIA:  General.   BRIEF HISTORY:  [name] [name] is a 55 year old female with a fairly long  history of repeated gradually worsening episodic right upper quadrant  abdominal pain and nausea postprandially.  She has had extensive workup  including a negative gallbladder ultrasound but a HIDA scan showing  reduced ejection fraction the gallbladder at 21%.  Her symptoms are  quite typical for biliary tract disease.  She is felt to have biliary  dyskinesia or acalculous cholecystitis.  Laparoscopic cholecystectomy  with cholangiogram has been recommend accepted.  The nature of the  procedure, indications, risks of bleeding, infection, bile leak and bile  duct injury discussed understood preoperatively.  She is now brought to  the operating room for this procedure.   DESCRIPTION OF OPERATION:  Following completion of a suprapubic bladder  sling by Dr. Retta Diones the patient was already in lithotomy position and  the perineum and abdomen were widely sterilely prepped and draped in  preparation for laparoscopic cholecystectomy to be followed by  laparoscopic assisted vaginal hysterectomy by Dr. Seymour Bars.  Correct  patient and procedure were verified.  She had received preoperative  antibiotics.  PAS were placed.  Local anesthesia was used infiltrate the  trocar sites prior to the incisions.  1 cm incision was made at the  umbilicus  and dissection carried down midline fascia which was sharply  incised for 1 cm the peritoneum entered under direct vision through  mattress sutures of 0 Vicryl.  The Hasson trocar was placed and  pneumoperitoneum established.  Under direct vision a 10 mm trocar was  placed in subxiphoid area and two 5 mm trocars on the right subcostal  margin.  The gallbladder was visualized and there were fairly extensive  omental adhesions filmy to the gallbladder and these were carefully  stripped down bluntly and with cautery dissection.  The infundibulum was  exposed retracted inferolaterally and further fibrofatty tissue was  stripped down off the neck of the gallbladder toward the porta hepatis.  The distal gallbladder and Calot's triangle was thoroughly dissected,  peritoneum anterior and posterior to Calot's triangle incised.  The  cystic artery was clearly seen coursing with the cystic duct down to the  gallbladder wall.  This was clipped and divided between two proximal and  one distal clip.  Distal gallbladder and Calot's triangle was dissected  and cystic duct gallbladder junction identified, dissected 360 degrees  and cystic duct dissected out over about a centimeter.  When the anatomy  was clear.  The cystic duct was clipped to the gallbladder junction  and  operative cholangiogram was obtained through the cystic duct.  This  showed good filling of the distal common bile duct with free flow of the  duodenum and no filling defects.  Proximally the bifurcation could be  visualized with no filling defects.  Following this the cholangiocath  was removed.  The cystic duct triply clipped proximally and divided.  The pelvis and dissected free from its bed using hook cautery removed  through the  umbilicus.  Complete hemostasis obtained in the gallbladder bed with  cautery and a small Surgicel pack placed as well.  Right upper quadrant  irrigated thoroughly.  Complete hemostasis assured.  At this  point Dr.  Seymour Bars proceeded with hysterectomy as planned.      [name], M.D.  Electronically Signed     BTH/MEDQ  D:  01/25/2006  T:  01/25/2006  Job:  [id number]

## 2010-08-15 NOTE — Op Note (Signed)
NAMEKARRAH, MANGINI             ACCOUNT NO.:  [id number]   MEDICAL RECORD NO.:  [id number]          PATIENT TYPE:  AMB   LOCATION:  SDC                           [facility]ACILITY:  [facility]   PHYSICIAN:  Bertram Millard. Dahlstedt, M.D.DATE OF BIRTH:  Nov 29, 1955   DATE OF PROCEDURE:  01/25/2006  DATE OF DISCHARGE:                                 OPERATIVE REPORT   PREOPERATIVE DIAGNOSIS:  Stress incontinence.   POSTOPERATIVE DIAGNOSIS:  Stress incontinence.   SURGICAL PROCEDURES:  Lynx suprapubic sling.   SURGEON:  Bertram Millard. Dahlstedt, M.D.   ANESTHESIA:  General endotracheal.   COMPLICATIONS:  None.   ESTIMATED BLOOD LOSS:  100 mL.   BRIEF HISTORY:  Ms. Housh is a 55 year old female who was sent in by Dr.  Seymour Bars approximately three a months ago for stress urinary incontinence.  This has been severe, and increasing in frequency and severity recently.  She wears two to three pads a day.  Evaluation revealed the patient to have  a moderate cystocele.  She had urodynamics performed in August.  This  revealed a fairly noncompliant bladder which was hypersensitive.  There was  no instability, however.  Leak point pressure of 100 mL was 130 cm of water.  With her cystocele reduced this decreased to 90 cm.  There is a moderate  volume loss.  She had an adequate flow rate.   The patient has been counseled in this in medical and surgical management  stress incontinence.  She presents at this time for a suprapubic sling.  Risks and complications of the procedure have been discussed with the  patient.  These include but are not limited to erosion and extrusion of the  sling through the urethra and the vaginal mucosa, respectively, bleeding,  infection, urinary retention, difficult voiding, persistent stress  incontinence, among others.  She understands these and desires to proceed.   DESCRIPTION OF PROCEDURE:  The patient was administered preoperative IV  antibiotics.  She was taken to the  operating room where general anesthetic  was administered.  She was placed in the dorsal lithotomy position.  The  lower abdomen, genitalia and perineum were prepped and draped.  The bladder  was catheterized and drained.  The anterior vaginal wall was infiltrated  with 9 mL of 1% lidocaine with epi.  A 2.5 cm incision was made in the  anterior vaginal wall in the midline and vaginal flaps raised bilaterally.  Dissection was carried to the pubocervical fascia bilaterally.  Two puncture  wounds were made in the suprapubic region just lateral to the midline  bilaterally.  The needles were then passed through the puncture wounds, down  to the space of Retzius to either side of the catheter bilaterally.  The  sling was then grasped and brought up, such that it cradled the mid urethra.  The sheath was removed.  The sling was positioned so there is approximately  1 cm of slack underlying the urethra.  The edges of the sling were then  trimmed below the skin bilaterally.  Inspection of the urethra and the  bladder revealed no puncture to the  urinary tract.  At this point the  suprapubic wounds were closed with Dermabond.  The vaginal incision was  closed with a running 2-0 Vicryl placed in simple fashion.  Vaginal pack was  placed.  The catheter was replaced in the bladder.   The patient tolerated procedure well.  Dr. Johna Sheriff then commenced with the  laparoscopic cholecystectomy.      Bertram Millard. Dahlstedt, M.D.  Electronically Signed     SMD/MEDQ  D:  01/25/2006  T:  01/25/2006  Job:  [id number]   cc:   Genia Del, M.D.  Fax: [phone]   [name], M.D.  [address], [address]  [place]  [place] [address]   [place]. Cliffton Asters, M.D.  Fax: [phone]

## 2010-09-02 ENCOUNTER — Other Ambulatory Visit: Payer: BC Managed Care – PPO | Admitting: *Deleted

## 2011-01-08 LAB — URINALYSIS, ROUTINE W REFLEX MICROSCOPIC
Glucose, UA: NEGATIVE
Ketones, ur: 15 — AB
Protein, ur: NEGATIVE

## 2011-01-08 LAB — LIPASE, BLOOD: Lipase: 20

## 2011-01-08 LAB — URINE CULTURE: Colony Count: >=100000

## 2011-01-08 LAB — POCT URINALYSIS DIP (DEVICE)
Bilirubin Urine: NEGATIVE
Glucose, UA: NEGATIVE
Ketones, ur: NEGATIVE
Operator id: 270961
Specific Gravity, Urine: >=1.03

## 2011-01-08 LAB — DIFFERENTIAL
Eosinophils Relative: 2
Lymphocytes Relative: 34
Lymphs Abs: 2.4
Monocytes Absolute: 0.7

## 2011-01-08 LAB — CBC
HCT: 38.5
Hemoglobin: 12.9
WBC: 7.1

## 2011-01-08 LAB — AMYLASE: Amylase: 37

## 2011-01-08 LAB — URINE MICROSCOPIC-ADD ON

## 2011-02-26 NOTE — Telephone Encounter (Signed)
Please close encounter

## 2011-07-05 ENCOUNTER — Emergency Department (HOSPITAL_BASED_OUTPATIENT_CLINIC_OR_DEPARTMENT_OTHER)
Admission: EM | Admit: 2011-07-05 | Discharge: 2011-07-06 | Disposition: A | Discharge status: Home / Self Care | Payer: BC Managed Care – PPO | Attending: Emergency Medicine | Admitting: Emergency Medicine

## 2011-07-05 ENCOUNTER — Emergency Department (INDEPENDENT_AMBULATORY_CARE_PROVIDER_SITE_OTHER): Payer: BC Managed Care – PPO

## 2011-07-05 ENCOUNTER — Encounter (HOSPITAL_BASED_OUTPATIENT_CLINIC_OR_DEPARTMENT_OTHER): Payer: Self-pay | Admitting: *Deleted

## 2011-07-05 DIAGNOSIS — F3289 Other specified depressive episodes: Secondary | ICD-10-CM | POA: Insufficient documentation

## 2011-07-05 DIAGNOSIS — I10 Essential (primary) hypertension: Secondary | ICD-10-CM | POA: Insufficient documentation

## 2011-07-05 DIAGNOSIS — Z79899 Other long term (current) drug therapy: Secondary | ICD-10-CM | POA: Insufficient documentation

## 2011-07-05 DIAGNOSIS — R05 Cough: Secondary | ICD-10-CM | POA: Insufficient documentation

## 2011-07-05 DIAGNOSIS — R059 Cough, unspecified: Secondary | ICD-10-CM | POA: Insufficient documentation

## 2011-07-05 DIAGNOSIS — R0602 Shortness of breath: Secondary | ICD-10-CM | POA: Insufficient documentation

## 2011-07-05 DIAGNOSIS — M1990 Unspecified osteoarthritis, unspecified site: Secondary | ICD-10-CM | POA: Insufficient documentation

## 2011-07-05 DIAGNOSIS — K219 Gastro-esophageal reflux disease without esophagitis: Secondary | ICD-10-CM | POA: Insufficient documentation

## 2011-07-05 DIAGNOSIS — M545 Low back pain, unspecified: Secondary | ICD-10-CM | POA: Insufficient documentation

## 2011-07-05 DIAGNOSIS — R109 Unspecified abdominal pain: Secondary | ICD-10-CM | POA: Insufficient documentation

## 2011-07-05 DIAGNOSIS — R350 Frequency of micturition: Secondary | ICD-10-CM | POA: Insufficient documentation

## 2011-07-05 DIAGNOSIS — R509 Fever, unspecified: Secondary | ICD-10-CM | POA: Insufficient documentation

## 2011-07-05 DIAGNOSIS — F329 Major depressive disorder, single episode, unspecified: Secondary | ICD-10-CM | POA: Insufficient documentation

## 2011-07-05 LAB — CBC
Hemoglobin: 11.9 g/dL — ABNORMAL LOW (ref 12.0–15.0)
MCH: 29.1 pg (ref 26.0–34.0)
Platelets: 143 10*3/uL — ABNORMAL LOW (ref 150–400)
RBC: 4.09 MIL/uL (ref 3.87–5.11)
WBC: 5.7 10*3/uL (ref 4.0–10.5)

## 2011-07-05 LAB — BASIC METABOLIC PANEL
BUN: 14 mg/dL (ref 6–23)
Chloride: 104 mEq/L (ref 96–112)
Glucose, Bld: 122 mg/dL — ABNORMAL HIGH (ref 70–99)
Potassium: 3.2 mEq/L — ABNORMAL LOW (ref 3.5–5.1)
Sodium: 138 mEq/L (ref 135–145)

## 2011-07-05 LAB — URINALYSIS, ROUTINE W REFLEX MICROSCOPIC
Bilirubin Urine: NEGATIVE
Leukocytes, UA: NEGATIVE
Nitrite: NEGATIVE
Specific Gravity, Urine: 1.021 (ref 1.005–1.030)
pH: 5.5 (ref 5.0–8.0)

## 2011-07-05 LAB — DIFFERENTIAL
Lymphocytes Relative: 16 % (ref 12–46)
Lymphs Abs: 0.9 10*3/uL (ref 0.7–4.0)
Monocytes Relative: 10 % (ref 3–12)
Neutro Abs: 4.1 10*3/uL (ref 1.7–7.7)
Neutrophils Relative %: 73 % (ref 43–77)

## 2011-07-05 MED ORDER — MORPHINE SULFATE 4 MG/ML IJ SOLN
4.0000 mg | Freq: Once | INTRAMUSCULAR | Status: AC
  Administered 2011-07-05: 4 mg via INTRAVENOUS
  Filled 2011-07-05: qty 1

## 2011-07-05 MED ORDER — SODIUM CHLORIDE 0.9 % IV BOLUS (SEPSIS)
500.0000 mL | Freq: Once | INTRAVENOUS | Status: AC
  Administered 2011-07-05: 500 mL via INTRAVENOUS

## 2011-07-05 MED ORDER — IOHEXOL 300 MG/ML  SOLN: 100.0000 mL | Freq: Once | INTRAMUSCULAR | Status: AC | PRN

## 2011-07-05 MED ORDER — IOHEXOL 300 MG/ML  SOLN
40.0000 mL | Freq: Once | INTRAMUSCULAR | Status: AC | PRN
  Administered 2011-07-05: 40 mL via ORAL

## 2011-07-05 MED ORDER — ONDANSETRON HCL 4 MG/2ML IJ SOLN
4.0000 mg | Freq: Once | INTRAMUSCULAR | Status: AC
  Administered 2011-07-05: 4 mg via INTRAVENOUS
  Filled 2011-07-05: qty 2

## 2011-07-05 NOTE — ED Provider Notes (Signed)
History   This chart was scribed for Joya Gaskins, MD by Charolett Bumpers . The patient was seen in room MH10/MH10 and the patient's care was started at 9:53pm.  CSN: [id number]  Arrival date & time 07/05/11  2045   First MD Initiated Contact with Patient 07/05/11 2143      Chief Complaint  Patient presents with  . Fever  . Abdominal Pain  . Back Pain     HPI [name] [name] is a 56 y.o. female who presents to the Emergency Department complaining of constant, moderate abdominal pain with associated fever and low back pain since yesterday. Patient reports that the fever started first. Patient reports that her fever was 100.8 at it's highest. Patient states that her symptoms are worsening. Patient denies v/d. Patient also reports associated cough and SOB when fever is spiked. Patient denies dysuria, but notes some increased frequency. Patient states that her last BM was normal but small. Patient states that she has taken Advil for the abdominal pain and fever, with minimal relief. Patient reports a h/o diverticulitis. Patient reports a h/o gall bladder surgery and a hysterectomy 5 years ago. Patient denies having a h/o similar symptoms.   PCP: Dr. Cliffton Asters  Past Medical History  Diagnosis Date  . Depression   . Hypertension   . Allergic rhinitis   . Fibroids     dysfuction uterine bleeding  . GERD (gastroesophageal reflux disease)   . RLS (restless legs syndrome)   . Osteoarthritis   . Fatty liver   . Hx of hysterectomy     Past Surgical History  Procedure Date  . Rectu   . Tubal ligation     bilateral  . Lumbar spine surgery   . Cholecystectomy   . Urethral sling   . Knee surgery     right    Family History  Problem Relation Age of Onset  . Coronary artery disease Father 22    deceased- TIAs  . Hypertension Mother 34    alive  . Deep vein thrombosis Mother   . Neuropathy Mother   . Osteoporosis Mother     History  Substance Use Topics  . Smoking  status: Never Smoker   . Smokeless tobacco: Not on file  . Alcohol Use: No    OB History    Grav Para Term Preterm Abortions TAB SAB Ect Mult Living                  Review of Systems A complete 10 system review of systems was obtained and all systems are negative except as noted in the HPI and PMH.   Allergies  Review of patient's allergies indicates no known allergies.  Home Medications   Current Outpatient Rx  Name Route Sig Dispense Refill  . ESTRADIOL 1 MG PO TABS Oral Take 1 mg by mouth daily.    Marland Kitchen FLUOXETINE HCL 10 MG PO CAPS Oral Take 30 mg by mouth daily.     . IBUPROFEN 200 MG PO TABS Oral Take 400 mg by mouth every 6 (six) hours as needed. For fever    . LISINOPRIL-HYDROCHLOROTHIAZIDE 20-12.5 MG PO TABS Oral Take 1 tablet by mouth daily.      Marland Kitchen ONE-DAILY MULTI VITAMINS PO TABS Oral Take 1 tablet by mouth daily.      Marland Kitchen PANTOPRAZOLE SODIUM 40 MG PO TBEC Oral Take 40 mg by mouth daily.      Marland Kitchen POTASSIUM CHLORIDE CRYS ER 20 MEQ PO  TBCR Oral Take 20-40 mEq by mouth 2 (two) times daily. 40 meq in the morning and 20 meq at night    . PRESCRIPTION MEDICATION Topical Apply 1 application topically 2 (two) times daily. Cream for eczema    . ROPINIROLE HCL 3 MG PO TABS Oral Take 3 mg by mouth at bedtime.      . FUROSEMIDE 40 MG PO TABS Oral Take 40 mg by mouth daily as needed. For swelling      BP 122/60  Pulse 96  Temp(Src) 99.3 F (37.4 C) (Oral)  Resp 20  SpO2 96%  Physical Exam CONSTITUTIONAL: Well developed/well nourished HEAD AND FACE: Normocephalic/atraumatic EYES: EOMI/PERRL, no icterus ENMT: Mucous membranes moist NECK: supple no meningeal signs SPINE:entire spine nontender CV: S1/S2 noted, no murmurs/rubs/gallops noted LUNGS: Lungs are clear to auscultation bilaterally, no apparent distress ABDOMEN: soft, no rebound or guarding. Right mid abdominal pain.tenderness is moderate. No hernia/bruising/erythema noted GU:no cva tenderness NEURO: Pt is awake/alert,  moves all extremitiesx4 EXTREMITIES: pulses normal, full ROM SKIN: warm, color normal PSYCH: no abnormalities of mood noted  ED Course  Procedures   DIAGNOSTIC STUDIES: Oxygen Saturation is 99% on room air, normal by my interpretation.    COORDINATION OF CARE:   2200: Discussed planned course of treatment with the patient who is agreeable at this time. 2200: Medication Orders: Sodium chloride 0.9% bolus 500 mL-once; Ondansetron injection 4 mg-once; Morphine 4 mg/mL injection 4 mg-once  11:25 PM Pt still with continued pain, will get CT imaging Pt stable at this time  12:09 AM D/w dr Judd Lien, he will f/u on CT imaging, if negative can be discharged home Pt stable currently   Labs Reviewed  CBC - Abnormal; Notable for the following:    Hemoglobin 11.9 (*)    HCT 35.6 (*)    Platelets 143 (*)    All other components within normal limits  BASIC METABOLIC PANEL - Abnormal; Notable for the following:    Potassium 3.2 (*)    Glucose, Bld 122 (*)    GFR calc non Af Amer 69 (*)    GFR calc Af Amer 80 (*)    All other components within normal limits  URINALYSIS, ROUTINE W REFLEX MICROSCOPIC  DIFFERENTIAL     MDM  Nursing notes reviewed and considered in documentation All labs/vitals reviewed and considered     I personally performed the services described in this documentation, which was scribed in my presence. The recorded information has been reviewed and considered.        Joya Gaskins, MD 07/06/11 [phone]

## 2011-07-05 NOTE — ED Notes (Signed)
MD at bedside. 

## 2011-07-05 NOTE — ED Notes (Signed)
Pt with abd pain fever and back pain since yesterday PM pt also reports frequent urination

## 2011-07-06 DIAGNOSIS — R509 Fever, unspecified: Secondary | ICD-10-CM

## 2011-07-06 DIAGNOSIS — K7689 Other specified diseases of liver: Secondary | ICD-10-CM

## 2011-07-06 DIAGNOSIS — R109 Unspecified abdominal pain: Secondary | ICD-10-CM

## 2011-07-06 MED ORDER — IOHEXOL 300 MG/ML  SOLN
100.0000 mL | Freq: Once | INTRAMUSCULAR | Status: AC | PRN
  Administered 2011-07-06: 100 mL via INTRAVENOUS

## 2011-07-06 MED ORDER — HYDROCODONE-ACETAMINOPHEN 5-500 MG PO TABS: 1.0000 | ORAL_TABLET | Freq: Four times a day (QID) | ORAL | Status: AC | PRN

## 2011-07-06 NOTE — ED Provider Notes (Signed)
Care assumed from Dr. Bebe Shaggy.  I agree with his note, assessment, and plan.  The ct returned okay.  Will discharge to home, return prn.    Geoffery Lyons, MD 07/06/11 [phone]

## 2011-08-20 ENCOUNTER — Telehealth: Payer: Self-pay | Admitting: Oncology

## 2011-08-20 NOTE — Telephone Encounter (Signed)
Gave appt calendar for 08/26/11 as a new patient, to   Dot Been from the lab, she is sister of the patient

## 2011-08-25 ENCOUNTER — Other Ambulatory Visit: Payer: Self-pay | Admitting: Oncology

## 2011-08-25 ENCOUNTER — Telehealth: Payer: Self-pay | Admitting: Oncology

## 2011-08-25 NOTE — Telephone Encounter (Signed)
Referred by Dr. Laurann Montana Dx- Factor V Leiden

## 2011-08-26 ENCOUNTER — Other Ambulatory Visit (HOSPITAL_BASED_OUTPATIENT_CLINIC_OR_DEPARTMENT_OTHER): Payer: BC Managed Care – PPO | Admitting: Lab

## 2011-08-26 ENCOUNTER — Ambulatory Visit (HOSPITAL_BASED_OUTPATIENT_CLINIC_OR_DEPARTMENT_OTHER): Payer: BC Managed Care – PPO | Admitting: Oncology

## 2011-08-26 ENCOUNTER — Ambulatory Visit: Payer: BC Managed Care – PPO | Admitting: Lab

## 2011-08-26 ENCOUNTER — Ambulatory Visit: Payer: BC Managed Care – PPO

## 2011-08-26 ENCOUNTER — Encounter: Payer: Self-pay | Admitting: Oncology

## 2011-08-26 ENCOUNTER — Telehealth: Payer: Self-pay | Admitting: Oncology

## 2011-08-26 VITALS — BP 143/83 | HR 75 | Temp 97.0°F | Ht 64.0 in | Wt 244.5 lb

## 2011-08-26 DIAGNOSIS — D6859 Other primary thrombophilia: Secondary | ICD-10-CM | POA: Insufficient documentation

## 2011-08-26 DIAGNOSIS — D689 Coagulation defect, unspecified: Secondary | ICD-10-CM

## 2011-08-26 DIAGNOSIS — D6851 Activated protein C resistance: Secondary | ICD-10-CM

## 2011-08-26 HISTORY — DX: Activated protein C resistance: D68.51

## 2011-08-26 LAB — CBC WITH DIFFERENTIAL/PLATELET
Eosinophils Absolute: 0.1 10*3/uL (ref 0.0–0.5)
HCT: 39.8 % (ref 34.8–46.6)
LYMPH%: 25.2 % (ref 14.0–49.7)
MONO#: 0.4 10*3/uL (ref 0.1–0.9)
NEUT#: 4.3 10*3/uL (ref 1.5–6.5)
Platelets: 195 10*3/uL (ref 145–400)
RBC: 4.5 10*6/uL (ref 3.70–5.45)
WBC: 6.5 10*3/uL (ref 3.9–10.3)
lymph#: 1.6 10*3/uL (ref 0.9–3.3)
nRBC: 0 % (ref 0–0)

## 2011-08-26 LAB — CHCC SMEAR

## 2011-08-26 LAB — MORPHOLOGY: PLT EST: ADEQUATE

## 2011-08-26 NOTE — Telephone Encounter (Signed)
Gave pt appt for July 2013 MD only and sent patient to the lab today

## 2011-08-26 NOTE — Progress Notes (Signed)
New Patient Hematology-Oncology Evaluation   [name] [name] [id number] 06-11-1955 56 y.o. 08/26/2011  CC: Dr. Aram Beecham white; Dr. Herma Ard; Dr. 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   HPI:  New patient evaluation for this 56 year old woman who has been in overall good health. She had a hysterectomy with oophorectomy about 5 years ago. She subsequently required a urethral sling procedure by Dr. Annabell Howells. About 2 years after that procedure she started to have recurrent urinary tract infections. Antibiotic suppression therapy was not effective. She read something on the Internet about using estrogen and she asked  Her primary care physician if she could try this. She was put on estradiol 1 mg daily. She has not had a urinary tract infection now in over a year. Her brother who is 4 years old had a DVT and a pulmonary embolus about a year ago. He was found to have a patent foramen ovale . To [name] [name]'s knowledge, he did not have any special hematology testing to see if he had any genetic predisposing factors towards clotting other than the anatomic heart defect.  The patient's mother had bilateral lower extremity DVTs and on another occasion an upper extremity clot when she was in her 24s. She lived until 39. A maternal grandmother had a DVT details not known. A paternal uncle had a stroke and possibly DVT in his 5s. Her father had no clotting problems. She has 3 sisters none of whom have had  clotting problems. Her oldest sister age 32 has a son with cerebral palsy who had a prolonged surgical procedure lasting over 8 hours and subsequently developed a pulmonary embolus and was found to have a "clot behind his knee". He was put on temporary anticoagulation which has now stopped.  Due to the family history of clotting and her current  estrogen use, her primary care physician was concerned and checked a special hematology profile. She was found to be a heterozygote for the factor V Leiden gene mutation and also had a low protein S. activity of 37% of control with reference range 63-140. A protein C activity was 151% of control (55-140) and since this is a natural anticoagulant, this value above the control range is of no concern. Antithrombin activity was normal 91% of control (77-123).  [name] [name] has never had a clot. She is obese. She does not smoke. She has been in a number of situations over her lifetime that would have predisposed towards clotting. She had 2 pregnancies, both vaginal deliveries, and had no clotting problems. Her daughter is now 63 year old and her son is 42 years old and neither one of them have had clotting problems. She has had multiple surgical procedures including the hysterectomy 5 years ago, a urethral sling procedure, surgery on a perineal fistula, cholecystectomy, and back surgery. Right knee surgery 3 years ago which was her most recent procedure. No clotting subsequent to the surgeries. She has degenerative arthritis but no signs or symptoms of a collagen vascular disorder. She does not bruise easily.   PMH: No history of MI, emphysema, asthma, tuberculosis, mononucleosis, hepatitis, yellow jaundice, malaria, thyroid disease, seizure, stroke Past Medical History  Diagnosis Date  . Depression   . Hypertension   . Allergic rhinitis   . Fibroids     dysfuction uterine bleeding  . GERD (gastroesophageal reflux  disease)   . RLS (restless legs syndrome)   . Osteoarthritis   . Fatty liver   . Hx of hysterectomy   . Factor V Leiden carrier 08/26/2011  . 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 Rectu   . Tubal ligation     bilateral  . Lumbar spine surgery   . Cholecystectomy   . Urethral sling   . Knee surgery     right    Allergies: No Known  Allergies  Medications: Estrace 1 mg by mouth daily, Prozac 10 mg 2 tablets daily, Lasix 40 mg when necessary leg swelling, Vicodin 5/500 one every 6 hours when necessary pain, ibuprofen 200 mg 2 tablets every 6 hours when necessary, Prinzide 20-12.5 mg daily, Ditropan XL 10 mg 1 daily, Protonix 40 mg daily, Testim 20 mEq twice a day, Requip 2 mg at bedtime, topical steroid cream when necessary for eczema, multivitamins 1 daily.   Social History: She is married. 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 She does not smoke. She has a occasional glass of wine. 2 healthy children a son and a daughter.  Family History: See history of present illness above Family History  Problem Relation Age of Onset  . Coronary artery disease Father 72    deceased- TIAs  . Hypertension Mother 39    alive  . Deep vein thrombosis Mother   . Neuropathy Mother   . Osteoporosis Mother     Review of Systems: Constitutional symptoms: No constitutional symptoms. She has gained about 10 pounds over the last year. HEENT: No chronic headaches. Respiratory: She does get dyspneic on exertion. Hyperhidrosis on exertion. Cardiovascular:  No chest pain or palpitations Gastrointestinal ROS: No abdominal pain no change in bowel habit. No hematochezia or melena. Genito-Urinary ROS: See above. Hematological and Lymphatic: No easy bruising, no epistaxis, no hematuria Musculoskeletal: Chronic arthritis pain both small and large joints Neurologic: No headache or change in vision Dermatologic: Occasional eczema rash, no easy bruising Remaining ROS negative.  Physical Exam: Blood pressure 143/83, pulse 75, temperature 97 F (36.1 C), temperature source Oral, height 5\' 4"  (1.626 m), weight 244 lb 8 oz (110.904 kg). Wt Readings from Last 3 Encounters:  08/26/11 244 lb 8 oz (110.904 kg)  07/14/10 263 lb 12.8 oz (119.659 kg)  06/06/10 260 lb (117.935 kg)    General appearance: Well-nourished  Caucasian woman Head: Normal Neck: Full range of motion Lymph nodes: No adenopathy Breasts: Not examined Lungs: Clear to auscultation resonant to percussion Heart: Regular rhythm no murmur Abdominal: Soft nontender no mass no organomegaly GU: Not examined Extremities: No edema no calf tenderness Neurologic: Mental status intact cranial nerves intact motor strength 5 over 5, reflexes absent but symmetric, pupils equal round reactive to light. Optic disc sharp vessels normal no hemorrhage or exudate. Skin: No rash or ecchymosis    Lab Results: Lab Results  Component Value Date   WBC 5.7 07/05/2011   HGB 11.9* 07/05/2011   HCT 35.6* 07/05/2011   MCV 87.0 07/05/2011   PLT 143* 07/05/2011     Chemistry      Component Value Date/Time   NA 138 07/05/2011 2221   K 3.2* 07/05/2011 2221   CL 104 07/05/2011 2221   CO2 24 07/05/2011 2221   BUN 14 07/05/2011 2221   CREATININE 0.90 07/05/2011 2221      Component Value Date/Time   CALCIUM 9.0 07/05/2011 2221   ALKPHOS 38*  07/31/2010 1625   ALKPHOS 100 07/31/2010 1625   AST 21 07/31/2010 1625   AST 21 07/31/2010 1625   ALT 11 07/31/2010 1625   ALT 15 07/31/2010 1625   BILITOT 0.1* 07/31/2010 1625   BILITOT 0.4 07/31/2010 1625        Impression and Plan: 56 year old woman who despite recently discovered genetic risk factors for clotting, has had numerous "stress tests" for clotting over the years including pregnancy and multiple surgical procedures and has not had a clotting event. She is now taking estrogen to reduce frequency of urinary tract infections. I was personally not aware that taking estrogen would do this. Now that we know that she is a factor V Leiden carrier and possibly has a decreased protein S. level (this result may be spurious because of the estrogen), in a perfect world, it would be best to stop the estrogen since  clotting risk and may increase from fourfold to 30 fold in Leiden carriers. I think it would be reasonable to explore other alternatives  to the estrogen replacement with respect to preventing recurrent urinary tract infections. While we are exploring these alternatives, I told her to go ahead and start 81 mg of aspirin daily. Although this is sub-optimal in preventing venous thrombosis, it does have some activity. At present there is really no indication for full dose anticoagulation with a drug such as warfarin. Recommendation: I told her to schedule an appointment with her gynecologist to discuss alternatives to estrogen. We may also need input from her urologist . I believe that we can find an alternative to using estrogen to prevent her recurrent urinary tract infections. I am going to complete her special hematology evaluation with lab assays for the prothrombin gene mutation, antiphospholipid antibodies, and a repeat protein S. profile.        Levert Feinstein, MD 08/26/2011, 12:53 PM

## 2011-08-29 LAB — LUPUS ANTICOAGULANT PANEL: Lupus Anticoagulant: NOT DETECTED

## 2011-08-29 LAB — PROTEIN S, TOTAL: Protein S Total: 105 % (ref 60–150)

## 2011-08-29 LAB — BETA-2 GLYCOPROTEIN ANTIBODIES
Beta-2-Glycoprotein I IgA: 8 A Units (ref <20)
Beta-2-Glycoprotein I IgM: 3 M Units (ref <20)

## 2011-08-29 LAB — PROTHROMBIN GENE MUTATION

## 2011-08-29 LAB — PROTEIN S, ANTIGEN, FREE: Protein S Ag, Free: 94 % normal (ref 50–147)

## 2011-08-29 LAB — CARDIOLIPIN ANTIBODIES, IGG, IGM, IGA: Anticardiolipin IgM: 0 MPL U/mL (ref <11)

## 2011-09-03 ENCOUNTER — Telehealth: Payer: Self-pay | Admitting: *Deleted

## 2011-09-03 NOTE — Telephone Encounter (Signed)
Message copied by Sabino Snipes on Thu Sep 03, 2011  4:00 PM ------      Message from: Levert Feinstein      Created: Wed Sep 02, 2011  7:39 PM       Call patient - repeat protein S studies were normal as expected; she tests negative for antiphospholipid antibodies and negative for the prothrombin gene mutation       Please route results to Dr Laurann Montana, Dr Dierdre Forth,

## 2011-09-03 NOTE — Telephone Encounter (Signed)
Message left on pt's identified mobile vm of lab results per Dr. Cyndie Chime & these results will be routed to Dr. Laurann Montana & Dr. Dierdre Forth.

## 2011-09-14 ENCOUNTER — Telehealth: Payer: Self-pay | Admitting: *Deleted

## 2011-09-14 NOTE — Telephone Encounter (Signed)
Pt called stating that her repeated lab results were fine.   Pt wanted to know if f/u appt with md in July is really necessary.   Message to md for review. Pt's   Cell      [phone].

## 2011-09-16 ENCOUNTER — Telehealth: Payer: Self-pay | Admitting: *Deleted

## 2011-09-16 NOTE — Telephone Encounter (Signed)
Notified pt's sister, Allie Dimmer that pt did not need to come to appt in July per Dr.Granfortuna.  She will relay message to pt.   Appt cancelled.

## 2011-10-26 ENCOUNTER — Ambulatory Visit: Payer: BC Managed Care – PPO | Admitting: Oncology

## 2012-03-02 ENCOUNTER — Encounter: Payer: Self-pay | Admitting: Cardiology

## 2012-03-02 ENCOUNTER — Ambulatory Visit (INDEPENDENT_AMBULATORY_CARE_PROVIDER_SITE_OTHER): Payer: BC Managed Care – PPO | Admitting: Cardiology

## 2012-03-02 VITALS — BP 128/78 | HR 88 | Ht 65.0 in | Wt 247.0 lb

## 2012-03-02 DIAGNOSIS — R0602 Shortness of breath: Secondary | ICD-10-CM

## 2012-03-02 DIAGNOSIS — I10 Essential (primary) hypertension: Secondary | ICD-10-CM

## 2012-03-02 NOTE — Patient Instructions (Addendum)
The current medical regimen is effective;  continue present plan and medications.  Your physician has requested that you have an echocardiogram in the [facility]. Echocardiography is a painless test that uses sound waves to create images of your heart. It provides your doctor with information about the size and shape of your heart and how well your heart's chambers and valves are working. This procedure takes approximately one hour. There are no restrictions for this procedure.  Follow up as needed.

## 2012-03-02 NOTE — Progress Notes (Signed)
HPI The patient presents for preoperative evaluation. I have seen her in the past for management of hypertension and edema. She was seen by [facility] in the past and there was report of a normal stress test some time in 2012 or possibly late 2011. She has had also an echocardiogram suggesting some asymmetric septal hypertrophy but a preserved ejection fraction. When I saw her previously I manage her conservatively and did not repeat any of the studies.  The patient is now going to have to have a right total knee replacement. She's not having any current cardiovascular symptoms though she is limited by knee and back pain. She has been able to get on a stationary bicycle without bringing on any chest pressure, neck or arm discomfort. She has not had any new palpitations, presyncope or syncope. She has had no PND or orthopnea. She actually thinks her lower extremity swelling is improved compared with previous.  No Known Allergies  Current Outpatient Prescriptions  Medication Sig Dispense Refill  . aspirin EC 81 MG tablet Take 81 mg by mouth daily.      Marland Kitchen estradiol (ESTRACE) 1 MG tablet Take 1 mg by mouth daily.      Marland Kitchen FLUoxetine (PROZAC) 10 MG capsule Take 20 mg by mouth daily.       . furosemide (LASIX) 40 MG tablet Take 40 mg by mouth daily as needed. For swelling      . HYDROcodone-acetaminophen (VICODIN) 5-500 MG per tablet Take 1 tablet by mouth every 6 (six) hours as needed.      Marland Kitchen ibuprofen (ADVIL,MOTRIN) 200 MG tablet Take 400 mg by mouth every 6 (six) hours as needed. For fever      . lisinopril-hydrochlorothiazide (PRINZIDE,ZESTORETIC) 20-12.5 MG per tablet Take 1 tablet by mouth daily.        . Multiple Vitamin (MULTIVITAMIN) tablet Take 1 tablet by mouth daily.        Marland Kitchen OVER THE COUNTER MEDICATION allergra for allergies when needed      . oxybutynin (DITROPAN-XL) 10 MG 24 hr tablet Take 10 mg by mouth daily.      . pantoprazole (PROTONIX) 40 MG tablet Take 40 mg by mouth 2 (two) times  daily.       . potassium chloride SA (K-DUR,KLOR-CON) 20 MEQ tablet 1 tab twice a day      . PRESCRIPTION MEDICATION Apply 1 application topically 2 (two) times daily. Cream for eczema      . rOPINIRole (REQUIP) 3 MG tablet Take 2 mg by mouth at bedtime.         Past Medical History  Diagnosis Date  . Depression   . Hypertension   . Allergic rhinitis   . Fibroids     dysfuction uterine bleeding  . GERD (gastroesophageal reflux disease)   . RLS (restless legs syndrome)   . Osteoarthritis   . Fatty liver   . Hx of hysterectomy   . Factor V Leiden carrier 08/26/2011  . 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 Rectu   . Tubal ligation     bilateral  . Lumbar spine surgery   . Cholecystectomy   . Urethral sling   . Knee surgery     right   ROS: As stated in the HPI and negative for all other systems.  PHYSICAL EXAM BP 128/78  Pulse 88  Ht 5\' 5"  (1.651 m)  Wt 247 lb (112.038 kg)  BMI 41.10 kg/m2  GENERAL:  Well appearing NECK:  No jugular venous distention, waveform within normal limits, carotid upstroke brisk and symmetric, no bruits, no thyromegaly LYMPHATICS:  No cervical, inguinal adenopathy LUNGS:  Clear to auscultation bilaterally BACK:  No CVA tenderness CHEST:  Unremarkable HEART:  PMI not displaced or sustained,S1 and S2 within normal limits, no S3, no S4, no clicks, no rubs, no murmurs ABD:  Flat, positive bowel sounds normal in frequency in pitch, no bruits, no rebound, no guarding, no midline pulsatile mass, no hepatomegaly, no splenomegaly EXT:  2 plus pulses throughout, trace, no cyanosis no clubbing SKIN:  No rashes no nodules NEURO:  Cranial nerves II through XII grossly intact, motor grossly intact throughout PSYCH:  Cognitively intact, oriented to person place and time  EKG:  Sinus rhythm, rate 85, axis within normal limits, intervals within normal limits, no acute ST-T wave changes  ASSESSMENT AND PLAN  PREOP  - The patient did have a stress test prior to her first appointment with me and I will retrieve this. Provided this was okay, with the absence of new symptoms, she would not need further cardiovascular testing.  I would however like to see an echocardiogram given the past history of some septal hypertrophy. I will arrange this.   (Dr. Aleen Campi)  EDEMA -  We are treating this conservatively and no change in therapy is indicated.  HYPERTENSION -  The blood pressure is at target. No change in medications is indicated. We will continue with therapeutic lifestyle changes (TLC).   OBESITY -  We have talked in the past about losing weight and hopefully she would have more mobility following her surgery.

## 2012-03-09 ENCOUNTER — Ambulatory Visit (HOSPITAL_COMMUNITY): Payer: BC Managed Care – PPO | Attending: Cardiovascular Disease | Admitting: Radiology

## 2012-03-09 DIAGNOSIS — R0989 Other specified symptoms and signs involving the circulatory and respiratory systems: Secondary | ICD-10-CM | POA: Insufficient documentation

## 2012-03-09 DIAGNOSIS — R0602 Shortness of breath: Secondary | ICD-10-CM

## 2012-03-09 DIAGNOSIS — E669 Obesity, unspecified: Secondary | ICD-10-CM | POA: Insufficient documentation

## 2012-03-09 DIAGNOSIS — I10 Essential (primary) hypertension: Secondary | ICD-10-CM | POA: Insufficient documentation

## 2012-03-09 DIAGNOSIS — R0609 Other forms of dyspnea: Secondary | ICD-10-CM

## 2012-03-09 DIAGNOSIS — I517 Cardiomegaly: Secondary | ICD-10-CM | POA: Insufficient documentation

## 2012-03-09 NOTE — Progress Notes (Signed)
Echocardiogram performed.  

## 2012-03-14 ENCOUNTER — Encounter: Payer: Self-pay | Admitting: Cardiology

## 2012-05-14 ENCOUNTER — Other Ambulatory Visit: Payer: Self-pay

## 2012-08-27 ENCOUNTER — Emergency Department (HOSPITAL_COMMUNITY)
Admission: EM | Admit: 2012-08-27 | Discharge: 2012-08-27 | Disposition: A | Discharge status: Home / Self Care | Payer: BC Managed Care – PPO | Attending: Emergency Medicine | Admitting: Emergency Medicine

## 2012-08-27 ENCOUNTER — Encounter (HOSPITAL_COMMUNITY): Payer: Self-pay | Admitting: *Deleted

## 2012-08-27 DIAGNOSIS — Z8719 Personal history of other diseases of the digestive system: Secondary | ICD-10-CM | POA: Insufficient documentation

## 2012-08-27 DIAGNOSIS — D689 Coagulation defect, unspecified: Secondary | ICD-10-CM | POA: Insufficient documentation

## 2012-08-27 DIAGNOSIS — H66019 Acute suppurative otitis media with spontaneous rupture of ear drum, unspecified ear: Secondary | ICD-10-CM | POA: Insufficient documentation

## 2012-08-27 DIAGNOSIS — K7689 Other specified diseases of liver: Secondary | ICD-10-CM | POA: Insufficient documentation

## 2012-08-27 DIAGNOSIS — G2581 Restless legs syndrome: Secondary | ICD-10-CM | POA: Insufficient documentation

## 2012-08-27 DIAGNOSIS — F3289 Other specified depressive episodes: Secondary | ICD-10-CM | POA: Insufficient documentation

## 2012-08-27 DIAGNOSIS — M1990 Unspecified osteoarthritis, unspecified site: Secondary | ICD-10-CM | POA: Insufficient documentation

## 2012-08-27 DIAGNOSIS — Z7982 Long term (current) use of aspirin: Secondary | ICD-10-CM | POA: Insufficient documentation

## 2012-08-27 DIAGNOSIS — H6643 Suppurative otitis media, unspecified, bilateral: Secondary | ICD-10-CM

## 2012-08-27 DIAGNOSIS — Z79899 Other long term (current) drug therapy: Secondary | ICD-10-CM | POA: Insufficient documentation

## 2012-08-27 DIAGNOSIS — Z8742 Personal history of other diseases of the female genital tract: Secondary | ICD-10-CM | POA: Insufficient documentation

## 2012-08-27 DIAGNOSIS — I10 Essential (primary) hypertension: Secondary | ICD-10-CM | POA: Insufficient documentation

## 2012-08-27 DIAGNOSIS — F329 Major depressive disorder, single episode, unspecified: Secondary | ICD-10-CM | POA: Insufficient documentation

## 2012-08-27 MED ORDER — OFLOXACIN 0.3 % OT SOLN
5.0000 [drp] | Freq: Every day | OTIC | Status: DC
  Filled 2012-08-27: qty 5

## 2012-08-27 MED ORDER — OFLOXACIN 0.3 % OT SOLN: 10.0000 [drp] | Freq: Two times a day (BID) | OTIC | Status: DC

## 2012-08-27 MED ORDER — OFLOXACIN 0.3 % OP SOLN
5.0000 [drp] | Freq: Every morning | OPHTHALMIC | Status: DC
  Administered 2012-08-27: 5 [drp] via OTIC
  Filled 2012-08-27: qty 5

## 2012-08-27 NOTE — ED Notes (Signed)
Pt has seen MD for right ear bleeding and now both ears have popped and are bleeding.  Pt is taking antibiotics.  Pt reports a headache for one week and face just feels funny.  Pt reports some sinus drainage with a cough

## 2012-08-27 NOTE — ED Provider Notes (Signed)
Medical screening examination/treatment/procedure(s) were performed by non-physician practitioner and as supervising physician I was immediately available for consultation/collaboration.   [name] [name]. [name], MD 08/27/12 4098

## 2012-08-27 NOTE — ED Provider Notes (Signed)
History     CSN: [id number]  Arrival date & time 08/27/12  1044   First MD Initiated Contact with Patient 08/27/12 1121      Chief Complaint  Patient presents with  . Ears bleeding     (Consider location/radiation/quality/duration/timing/severity/associated sxs/prior treatment) HPI Comments: Patient presents with complaint of bilateral ear pain and drainage for the past 2 days. Patient saw her primary care physician 2 days ago for "feeling bad". She was started on cefuroxime for sinus infection. Later that evening the patient began having severe right ear pain which resolves when her ear "popped" with associated purulent, sanguinous drainage. The same thing that happened to the left ear on the following day. Patient states that her hearing is decreased on both sides. She estimates she hears approximately 20% of what she normally can hear. She denies fever, nasal congestion. She has had a sore throat and cough. No nausea, vomiting, or diarrhea. She's been taking her medications as prescribed. Onset of symptoms acute. Course is constant. Nothing makes symptoms better worse. Patient denies head trauma. She denies placing any objects into the ear canal.  The history is provided by the patient.    Past Medical History  Diagnosis Date  . Depression   . Hypertension   . Allergic rhinitis   . Fibroids     dysfuction uterine bleeding  . GERD (gastroesophageal reflux disease)   . RLS (restless legs syndrome)   . Osteoarthritis   . Fatty liver   . Hx of hysterectomy   . Factor V Leiden carrier 08/26/2011  . Protein S deficiency 08/26/2011    Past Surgical History  Procedure Laterality Date  . Rectu    . Tubal ligation      bilateral  . Lumbar spine surgery    . Cholecystectomy    . Urethral sling    . Knee surgery      right    Family History  Problem Relation Age of Onset  . Coronary artery disease Father 47    deceased- TIAs  . Hypertension Mother 74    alive  . Deep vein  thrombosis Mother   . Neuropathy Mother   . Osteoporosis Mother     History  Substance Use Topics  . Smoking status: Never Smoker   . Smokeless tobacco: Not on file  . Alcohol Use: No    OB History   Grav Para Term Preterm Abortions TAB SAB Ect Mult Living                  Review of Systems  Constitutional: Negative for fever, chills and fatigue.  HENT: Positive for hearing loss, ear pain, sore throat and ear discharge. Negative for congestion, rhinorrhea, neck stiffness, sinus pressure and tinnitus.   Eyes: Negative for redness.  Respiratory: Positive for cough. Negative for wheezing.   Gastrointestinal: Negative for nausea, vomiting, abdominal pain and diarrhea.  Genitourinary: Negative for dysuria.  Musculoskeletal: Negative for myalgias.  Skin: Negative for rash.  Neurological: Negative for headaches.  Hematological: Negative for adenopathy.    Allergies  Review of patient's allergies indicates no known allergies.  Home Medications   Current Outpatient Rx  Name  Route  Sig  Dispense  Refill  . aspirin EC 81 MG tablet   Oral   Take 81 mg by mouth daily.         Marland Kitchen estradiol (ESTRACE) 1 MG tablet   Oral   Take 1 mg by mouth daily.         Marland Kitchen  FLUoxetine (PROZAC) 10 MG capsule   Oral   Take 20 mg by mouth daily.          . furosemide (LASIX) 40 MG tablet   Oral   Take 40 mg by mouth daily as needed. For swelling         . HYDROcodone-acetaminophen (VICODIN) 5-500 MG per tablet   Oral   Take 1 tablet by mouth every 6 (six) hours as needed.         Marland Kitchen ibuprofen (ADVIL,MOTRIN) 200 MG tablet   Oral   Take 400 mg by mouth every 6 (six) hours as needed. For fever         . lisinopril-hydrochlorothiazide (PRINZIDE,ZESTORETIC) 20-12.5 MG per tablet   Oral   Take 1 tablet by mouth daily.           . Multiple Vitamin (MULTIVITAMIN) tablet   Oral   Take 1 tablet by mouth daily.           Marland Kitchen OVER THE COUNTER MEDICATION      allergra for  allergies when needed         . oxybutynin (DITROPAN-XL) 10 MG 24 hr tablet   Oral   Take 10 mg by mouth daily.         . pantoprazole (PROTONIX) 40 MG tablet   Oral   Take 40 mg by mouth 2 (two) times daily.          . potassium chloride SA (K-DUR,KLOR-CON) 20 MEQ tablet      1 tab twice a day         . PRESCRIPTION MEDICATION   Topical   Apply 1 application topically 2 (two) times daily. Cream for eczema         . rOPINIRole (REQUIP) 3 MG tablet   Oral   Take 2 mg by mouth at bedtime.            BP 156/83  Pulse 111  Temp(Src) 98.2 F (36.8 C)  Resp 18  SpO2 97%  Physical Exam  Nursing note and vitals reviewed. Constitutional: She appears well-developed and well-nourished.  HENT:  Head: Normocephalic and atraumatic. No trismus in the jaw.  Right Ear: Ear canal normal. There is drainage. No swelling or tenderness. No mastoid tenderness. Tympanic membrane is perforated. Decreased hearing is noted.  Left Ear: Ear canal normal. There is drainage. No swelling or tenderness. No mastoid tenderness. Tympanic membrane is perforated. Decreased hearing is noted.  Nose: Nose normal. No mucosal edema or rhinorrhea.  Mouth/Throat: Uvula is midline, oropharynx is clear and moist and mucous membranes are normal. Mucous membranes are not dry. No oral lesions. No edematous. No oropharyngeal exudate, posterior oropharyngeal edema, posterior oropharyngeal erythema or tonsillar abscesses.  Eyes: Conjunctivae are normal. Right eye exhibits no discharge. Left eye exhibits no discharge.  Neck: Normal range of motion. Neck supple.  Cardiovascular: Normal rate, regular rhythm and normal heart sounds.   Pulmonary/Chest: Effort normal and breath sounds normal. No respiratory distress. She has no wheezes. She has no rales.  Abdominal: Soft. There is no tenderness.  Lymphadenopathy:    She has no cervical adenopathy.  Neurological: She is alert.  Skin: Skin is warm and dry.   Psychiatric: She has a normal mood and affect.    ED Course  Procedures (including critical care time)  Labs Reviewed - No data to display No results found.   1. Suppurative otitis media with tympanic membrane rupture, bilateral  11:34 AM Patient seen and examined. Medications ordered.   Vital signs reviewed and are as follows: Filed Vitals:   08/27/12 1127  BP:   Pulse:   Temp: 98.2 F (36.8 C)  Resp:   BP 156/83  Pulse 111  Temp(Src) 98.2 F (36.8 C)  Resp 18  SpO2 97%  12:28 PM patient has received ofloxacin drops. Referral given for ENT. Patient told to return with worsening symptoms, fever, redness of the ear skin around the ear, or she has any other concerns. Patient verbalizes understanding and agrees with the plan.   MDM  Patient with suppurative otitis media, bilateral, with perforations. Hearing is decreased. Given the severity of this infection with decreased hearing I feel that ENT referral is appropriate. There is no sign of malignant otitis externa. There is no systemic symptoms including fever, nausea or vomiting. No imaging is indicated at this time. Eardrops added to oral regimen.         Renne Crigler, PA-C 08/27/12 1229

## 2012-08-27 NOTE — ED Notes (Signed)
Pt stated that she her right ear popped on last Thursday and it started bleeding. She was placed on ABX for ear infection. She stated that last night her left ear popped and started bleeding. She stated that she has a hard time hearing out of both ears. She said that she has a frontal headache. No weakness. Slight vision change. Will continue to monitor.

## 2012-08-27 NOTE — ED Notes (Signed)
PA at bedside.

## 2012-10-12 ENCOUNTER — Emergency Department (HOSPITAL_COMMUNITY): Payer: BC Managed Care – PPO

## 2012-10-12 ENCOUNTER — Encounter (HOSPITAL_COMMUNITY): Payer: Self-pay | Admitting: Emergency Medicine

## 2012-10-12 ENCOUNTER — Emergency Department (HOSPITAL_COMMUNITY)
Admission: EM | Admit: 2012-10-12 | Discharge: 2012-10-12 | Disposition: A | Discharge status: Home / Self Care | Payer: BC Managed Care – PPO | Attending: Emergency Medicine | Admitting: Emergency Medicine

## 2012-10-12 DIAGNOSIS — Z79899 Other long term (current) drug therapy: Secondary | ICD-10-CM | POA: Insufficient documentation

## 2012-10-12 DIAGNOSIS — F329 Major depressive disorder, single episode, unspecified: Secondary | ICD-10-CM | POA: Insufficient documentation

## 2012-10-12 DIAGNOSIS — F3289 Other specified depressive episodes: Secondary | ICD-10-CM | POA: Insufficient documentation

## 2012-10-12 DIAGNOSIS — Z7982 Long term (current) use of aspirin: Secondary | ICD-10-CM | POA: Insufficient documentation

## 2012-10-12 DIAGNOSIS — R109 Unspecified abdominal pain: Secondary | ICD-10-CM

## 2012-10-12 DIAGNOSIS — K219 Gastro-esophageal reflux disease without esophagitis: Secondary | ICD-10-CM | POA: Insufficient documentation

## 2012-10-12 DIAGNOSIS — Z90710 Acquired absence of both cervix and uterus: Secondary | ICD-10-CM | POA: Insufficient documentation

## 2012-10-12 DIAGNOSIS — Z9089 Acquired absence of other organs: Secondary | ICD-10-CM | POA: Insufficient documentation

## 2012-10-12 DIAGNOSIS — I10 Essential (primary) hypertension: Secondary | ICD-10-CM | POA: Insufficient documentation

## 2012-10-12 DIAGNOSIS — D6859 Other primary thrombophilia: Secondary | ICD-10-CM | POA: Insufficient documentation

## 2012-10-12 DIAGNOSIS — K7689 Other specified diseases of liver: Secondary | ICD-10-CM | POA: Insufficient documentation

## 2012-10-12 DIAGNOSIS — R1031 Right lower quadrant pain: Secondary | ICD-10-CM | POA: Insufficient documentation

## 2012-10-12 DIAGNOSIS — M1990 Unspecified osteoarthritis, unspecified site: Secondary | ICD-10-CM | POA: Insufficient documentation

## 2012-10-12 LAB — CBC WITH DIFFERENTIAL/PLATELET
Basophils Relative: 0 % (ref 0–1)
Eosinophils Absolute: 0.1 10*3/uL (ref 0.0–0.7)
Eosinophils Relative: 2 % (ref 0–5)
Hemoglobin: 12.9 g/dL (ref 12.0–15.0)
MCH: 28.9 pg (ref 26.0–34.0)
MCHC: 33.8 g/dL (ref 30.0–36.0)
Monocytes Relative: 9 % (ref 3–12)
Neutrophils Relative %: 65 % (ref 43–77)

## 2012-10-12 LAB — COMPREHENSIVE METABOLIC PANEL
Albumin: 3.2 g/dL — ABNORMAL LOW (ref 3.5–5.2)
Alkaline Phosphatase: 99 U/L (ref 39–117)
BUN: 18 mg/dL (ref 6–23)
Calcium: 9.2 mg/dL (ref 8.4–10.5)
Creatinine, Ser: 1 mg/dL (ref 0.50–1.10)
Potassium: 3.7 mEq/L (ref 3.5–5.1)
Total Protein: 6.3 g/dL (ref 6.0–8.3)

## 2012-10-12 LAB — URINALYSIS, ROUTINE W REFLEX MICROSCOPIC
Glucose, UA: NEGATIVE mg/dL
Leukocytes, UA: NEGATIVE
Nitrite: NEGATIVE
Specific Gravity, Urine: 1.026 (ref 1.005–1.030)
pH: 5.5 (ref 5.0–8.0)

## 2012-10-12 LAB — CG4 I-STAT (LACTIC ACID): Lactic Acid, Venous: 1.09 mmol/L (ref 0.5–2.2)

## 2012-10-12 MED ORDER — ONDANSETRON HCL 4 MG/2ML IJ SOLN
4.0000 mg | Freq: Once | INTRAMUSCULAR | Status: AC
  Administered 2012-10-12: 4 mg via INTRAVENOUS
  Filled 2012-10-12: qty 2

## 2012-10-12 MED ORDER — IOHEXOL 300 MG/ML  SOLN
25.0000 mL | INTRAMUSCULAR | Status: AC
  Administered 2012-10-12: 25 mL via ORAL

## 2012-10-12 MED ORDER — KCL IN DEXTROSE-NACL 20-5-0.45 MEQ/L-%-% IV SOLN
Freq: Once | INTRAVENOUS | Status: AC
  Administered 2012-10-12: 07:00:00 via INTRAVENOUS
  Filled 2012-10-12: qty 1000

## 2012-10-12 MED ORDER — MORPHINE SULFATE 4 MG/ML IJ SOLN
4.0000 mg | Freq: Once | INTRAMUSCULAR | Status: AC
  Administered 2012-10-12: 4 mg via INTRAVENOUS
  Filled 2012-10-12: qty 1

## 2012-10-12 MED ORDER — SODIUM CHLORIDE 0.9 % IV BOLUS (SEPSIS)
1000.0000 mL | Freq: Once | INTRAVENOUS | Status: AC
  Administered 2012-10-12: 1000 mL via INTRAVENOUS

## 2012-10-12 NOTE — ED Provider Notes (Signed)
History    CSN: [id number] Arrival date & time 10/12/12  4098  First MD Initiated Contact with Patient 10/12/12 [phone]     Chief Complaint  Patient presents with  . Abdominal Pain   (Consider location/radiation/quality/duration/timing/severity/associated sxs/prior Treatment) HPI  Patient is a very pleasant 57 yo [name] who presents with complaints of increasingly severe RLQ abdominal pain x approximately 24 hrs.  Pain is aching and pressure like, severe in nature and nonradiating. Patient denies loss of appetite, N/V/D. She notes some mild pressure with urination but no dysuria, hesitancy or gross hematuria. No fever.   Sx are worse with walking or other movements. Patient feels most comfortable when laying still. She denies history of similar sx.   Only previous abdominal surgery was remote hysterectomy.   Last meal was approximately 11 hrs ago.  Past Medical History  Diagnosis Date  . Depression   . Hypertension   . Allergic rhinitis   . Fibroids     dysfuction uterine bleeding  . GERD (gastroesophageal reflux disease)   . RLS (restless legs syndrome)   . Osteoarthritis   . Fatty liver   . Hx of hysterectomy   . Factor V Leiden carrier 08/26/2011  . Protein S deficiency 08/26/2011   Past Surgical History  Procedure Laterality Date  . Rectu    . Tubal ligation      bilateral  . Lumbar spine surgery    . Cholecystectomy    . Urethral sling    . Knee surgery      right   Family History  Problem Relation Age of Onset  . Coronary artery disease Father 47    deceased- TIAs  . Hypertension Mother 51    alive  . Deep vein thrombosis Mother   . Neuropathy Mother   . Osteoporosis Mother    History  Substance Use Topics  . Smoking status: Never Smoker   . Smokeless tobacco: Not on file  . Alcohol Use: No   OB History   Grav Para Term Preterm Abortions TAB SAB Ect Mult Living                 Review of Systems Gen: no weight loss, fevers, chills, night  sweats Eyes: no discharge or drainage, no occular pain or visual changes Nose: no epistaxis or rhinorrhea Mouth: no dental pain, no sore throat Neck: no neck pain Lungs: no SOB, cough, wheezing CV: no chest pain, palpitations, dependent edema or orthopnea Abd: As per history of present illness, otherwise negative GU: no dysuria or gross hematuria MSK: no myalgias or arthralgias Neuro: no headache, no focal neurologic deficits Skin: no rash Psyche: negative.  Allergies  Review of patient's allergies indicates no known allergies.  Home Medications   Current Outpatient Rx  Name  Route  Sig  Dispense  Refill  . aspirin EC 81 MG tablet   Oral   Take 81 mg by mouth daily.         . cefUROXime (CEFTIN) 500 MG tablet   Oral   Take 500 mg by mouth 2 (two) times daily. Take for 10 days. First dose on 08/25/12         . estradiol (ESTRACE) 1 MG tablet   Oral   Take 1 mg by mouth daily.         Marland Kitchen FLUoxetine (PROZAC) 10 MG capsule   Oral   Take 20 mg by mouth daily.          Marland Kitchen  ibuprofen (ADVIL,MOTRIN) 200 MG tablet   Oral   Take 400 mg by mouth every 6 (six) hours as needed. For fever         . lisinopril-hydrochlorothiazide (PRINZIDE,ZESTORETIC) 20-12.5 MG per tablet   Oral   Take 1 tablet by mouth daily.           . Magnesium-Zinc (MAGNESIUM-CHELATED ZINC PO)   Oral   Take 1 tablet by mouth daily.         . Multiple Vitamin (MULTIVITAMIN) tablet   Oral   Take 1 tablet by mouth daily.           Marland Kitchen ofloxacin (FLOXIN) 0.3 % otic solution   Both Ears   Place 10 drops into both ears 2 (two) times daily.   10 mL   0   . OVER THE COUNTER MEDICATION      allergra for allergies when needed         . oxybutynin (DITROPAN-XL) 10 MG 24 hr tablet   Oral   Take 10 mg by mouth daily.         Marland Kitchen oxyCODONE-acetaminophen (PERCOCET/ROXICET) 5-325 MG per tablet   Oral   Take 1 tablet by mouth every 4 (four) hours as needed for pain.         . pantoprazole  (PROTONIX) 40 MG tablet   Oral   Take 40 mg by mouth 2 (two) times daily.          . potassium chloride SA (K-DUR,KLOR-CON) 20 MEQ tablet   Oral   Take 20 mEq by mouth 2 (two) times daily.          Marland Kitchen PRESCRIPTION MEDICATION   Topical   Apply 1 application topically 2 (two) times daily. Cream for eczema         . rOPINIRole (REQUIP) 2 MG tablet   Oral   Take 2 mg by mouth at bedtime.         Marland Kitchen rOPINIRole (REQUIP) 3 MG tablet   Oral   Take 2 mg by mouth at bedtime.           BP 130/74  Pulse 96  Temp(Src) 97.9 F (36.6 C) (Oral)  Resp 20  SpO2 96% Physical Exam Gen: well developed and well nourished appearing, appears uncomfortable Head: NCAT Eyes: PERL, EOMI Nose: no epistaixis or rhinorrhea Mouth/throat: mucosa is moist and pink Neck: supple, no stridor Lungs: CTA B, no wheezing, rhonchi or rales Heart-regular rate and rhythm, pulse 96, no murmur, extremities well perfused Abd: soft, , obese tenderness over the lower quadrants bilaterally right greater than left, no guarding or rebound tenderness, negative Rovsing sign, negative psoas and obturator signs nondistended, no hernia appreciated Back: no ttp, no cva ttp Skin: no rashese, wnl Neuro: CN ii-xii grossly intact, no focal deficits Psyche; mildly anxious affect,  calm and cooperative.   ED Course  Procedures (including critical care time)  Results for orders placed during the hospital encounter of 10/12/12 (from the past 24 hour(s))  URINALYSIS, ROUTINE W REFLEX MICROSCOPIC     Status: Abnormal   Collection Time    10/12/12  5:55 AM      Result Value Range   Color, Urine YELLOW  YELLOW   APPearance HAZY (*) CLEAR   Specific Gravity, Urine 1.026  1.005 - 1.030   pH 5.5  5.0 - 8.0   Glucose, UA NEGATIVE  NEGATIVE mg/dL   Hgb urine dipstick NEGATIVE  NEGATIVE   Bilirubin Urine NEGATIVE  NEGATIVE   Ketones, ur NEGATIVE  NEGATIVE mg/dL   Protein, ur NEGATIVE  NEGATIVE mg/dL   Urobilinogen, UA 0.2   0.0 - 1.0 mg/dL   Nitrite NEGATIVE  NEGATIVE   Leukocytes, UA NEGATIVE  NEGATIVE  CBC WITH DIFFERENTIAL     Status: None   Collection Time    10/12/12  6:40 AM      Result Value Range   WBC 7.4  4.0 - 10.5 K/uL   RBC 4.46  3.87 - 5.11 MIL/uL   Hemoglobin 12.9  12.0 - 15.0 g/dL   HCT 16.1  09.6 - 04.5 %   MCV 85.7  78.0 - 100.0 fL   MCH 28.9  [name].0 - 34.0 pg   MCHC 33.8  30.0 - 36.0 g/dL   RDW 40.9  81.1 - 91.4 %   Platelets 170  150 - 400 K/uL   Neutrophils Relative % 65  43 - 77 %   Neutro Abs 4.8  1.7 - 7.7 K/uL   Lymphocytes Relative 24  12 - 46 %   Lymphs Abs 1.8  0.7 - 4.0 K/uL   Monocytes Relative 9  3 - 12 %   Monocytes Absolute 0.7  0.1 - 1.0 K/uL   Eosinophils Relative 2  0 - 5 %   Eosinophils Absolute 0.1  0.0 - 0.7 K/uL   Basophils Relative 0  0 - 1 %   Basophils Absolute 0.0  0.0 - 0.1 K/uL  COMPREHENSIVE METABOLIC PANEL     Status: Abnormal   Collection Time    10/12/12  6:40 AM      Result Value Range   Sodium 137  135 - 145 mEq/L   Potassium 3.7  3.5 - 5.1 mEq/L   Chloride 102  96 - 112 mEq/L   CO2 [name]  19 - 32 mEq/L   Glucose, Bld 90  70 - 99 mg/dL   BUN 18  6 - 23 mg/dL   Creatinine, Ser 7.82  0.50 - 1.10 mg/dL   Calcium 9.2  8.4 - 95.6 mg/dL   Total Protein 6.3  6.0 - 8.3 g/dL   Albumin 3.2 (*) 3.5 - 5.2 g/dL   AST 13  0 - 37 U/L   ALT 14  0 - 35 U/L   Alkaline Phosphatase 99  39 - 117 U/L   Total Bilirubin 0.7  0.3 - 1.2 mg/dL   GFR calc non Af Amer 60 (*) >90 mL/min   GFR calc Af Amer 70 (*) >90 mL/min   CT ABDOMEN AND PELVIS WITH CONTRAST  Technique: Multidetector CT imaging of the abdomen and pelvis was performed following the standard protocol during bolus administration of intravenous contrast.  Contrast: 100 ml of Omnipaque-300.  Comparison: CT of the abdomen and pelvis 07/06/2011.  Findings:  Lung Bases: A small amount of scarring in the lower lobes of the lungs bilaterally. A 3 mm subpleural nodule along the right major fissure  (image 1 of series 3) is unchanged compared to prior study from 07/06/2011, and can be considered benign, likely a subpleural lymph node.  Abdomen/Pelvis: Status post cholecystectomy. The appearance of the liver, pancreas, spleen and bilateral adrenal glands is unremarkable. Numerous peripelvic cysts in the left kidney, similar to the prior study. A 1.4 cm exophytic simple cyst in the medial aspect of the upper pole of the right kidney is also noted.  Normal appendix. No significant volume of ascites. No pneumoperitoneum. No pathologic distension of small bowel. No definite pathologic  lymphadenopathy identified within the abdomen or pelvis. Status post hysterectomy. Ovaries are not confidently identified may be atrophic or surgically absent. Urinary bladder is unremarkable in appearance.  Musculoskeletal: There are no aggressive appearing lytic or blastic lesions noted in the visualized portions of the skeleton.  IMPRESSION: 1. No acute findings in the abdomen or pelvis to account the patient's symptoms. 2. Normal appendix. 3. Status post cholecystectomy and hysterectomy. 4. Additional incidental findings, as above.      MDM  Ddx: appendicitis, UTI, colitis, IBD, IBS, diverticulitis, mesenteric ischemia unlikely in light of focality of pain and focal ttp.  We will manage symptomatically and obtain CBC, CMP, U/A and CT abd/pelvis with contrast.  Patient is to be NPO.   0840:  Results of lab and radiology studies discussed with patient. She is feeling better. Lactic acid level is pending to allow [place] to exclude mesenteric ischemia as cause of sx - although, again, I feel this is unlikely. Anticipate that the patient will be discharged home. The patient says she will be able to follow up with her PCP in the next 24 hrs.  Advised to return to the ED for recurrent pain, fever or any urgent health concerns.   Brandt Loosen, MD 10/12/12 [phone]

## 2012-10-12 NOTE — ED Notes (Signed)
PT. REPORTS RLQ PAIN ONSET YESTERDAY MORNING , DENIES NAUSEA,VOMITTING OR DIARRHEA , NO FEVER OR CHILLS.

## 2012-10-26 ENCOUNTER — Other Ambulatory Visit: Payer: Self-pay | Admitting: Family Medicine

## 2012-10-26 DIAGNOSIS — M545 Low back pain: Secondary | ICD-10-CM

## 2012-10-27 ENCOUNTER — Ambulatory Visit
Admission: RE | Admit: 2012-10-27 | Discharge: 2012-10-27 | Disposition: A | Discharge status: Home / Self Care | Payer: BC Managed Care – PPO | Source: Ambulatory Visit | Attending: Family Medicine | Admitting: Family Medicine

## 2012-10-27 DIAGNOSIS — M545 Low back pain: Secondary | ICD-10-CM

## 2013-02-02 ENCOUNTER — Other Ambulatory Visit: Payer: Self-pay

## 2013-03-02 ENCOUNTER — Other Ambulatory Visit: Payer: BC Managed Care – PPO

## 2013-03-02 ENCOUNTER — Other Ambulatory Visit: Payer: Self-pay | Admitting: Family Medicine

## 2013-03-02 DIAGNOSIS — R109 Unspecified abdominal pain: Secondary | ICD-10-CM

## 2013-03-06 ENCOUNTER — Ambulatory Visit
Admission: RE | Admit: 2013-03-06 | Discharge: 2013-03-06 | Disposition: A | Discharge status: Home / Self Care | Payer: BC Managed Care – PPO | Source: Ambulatory Visit | Attending: Family Medicine | Admitting: Family Medicine

## 2013-03-06 DIAGNOSIS — R109 Unspecified abdominal pain: Secondary | ICD-10-CM

## 2013-03-06 MED ORDER — IOHEXOL 300 MG/ML  SOLN
125.0000 mL | Freq: Once | INTRAMUSCULAR | Status: AC | PRN
  Administered 2013-03-06: 125 mL via INTRAVENOUS

## 2013-08-14 ENCOUNTER — Ambulatory Visit (INDEPENDENT_AMBULATORY_CARE_PROVIDER_SITE_OTHER): Payer: BC Managed Care – PPO | Admitting: Cardiology

## 2013-08-14 ENCOUNTER — Encounter: Payer: Self-pay | Admitting: Cardiology

## 2013-08-14 VITALS — BP 132/62 | HR 57 | Ht 65.0 in | Wt 233.4 lb

## 2013-08-14 DIAGNOSIS — R079 Chest pain, unspecified: Secondary | ICD-10-CM

## 2013-08-14 DIAGNOSIS — R0602 Shortness of breath: Secondary | ICD-10-CM

## 2013-08-14 DIAGNOSIS — I10 Essential (primary) hypertension: Secondary | ICD-10-CM

## 2013-08-14 LAB — TSH: TSH: 0.69 u[IU]/mL (ref 0.35–4.50)

## 2013-08-14 NOTE — Progress Notes (Signed)
HPI The patient presents for preoperative evaluation. I have seen her in the past for management of bradycardia.  I saw her in the past prior to knee surgery.  She had had a previous stress test that was negative.  she has had also an echocardiogram suggesting some asymmetric septal hypertrophy but a preserved ejection fraction and no gradient.   She has been noted recently to have a low heart rhythm. She was complaining of some atypical chest discomfort. An EKG was done. There were no acute abnormalities. Since then she has been keeping her blood pressure and heart rate in her pressures have been running from 562Z to 308M systolic her heart rate has often been in the 50s which causes her to feel somewhat weak and lightheaded. There's been no presyncope or syncope. She's not describing classic chest pressure, neck or arm discomfort. She has had no new weight gain or edema.  No Known Allergies  Current Outpatient Prescriptions  Medication Sig Dispense Refill  . aspirin EC 81 MG tablet Take 81 mg by mouth daily.      Marland Kitchen doxycycline (VIBRAMYCIN) 100 MG capsule       . estradiol (ESTRACE) 1 MG tablet Take 0.5 mg by mouth daily.       Marland Kitchen FLUoxetine (PROZAC) 10 MG capsule Take 30 mg by mouth daily.       Marland Kitchen ibuprofen (ADVIL,MOTRIN) 200 MG tablet Take 400 mg by mouth every 6 (six) hours as needed. For fever      . lisinopril-hydrochlorothiazide (PRINZIDE,ZESTORETIC) 20-25 MG per tablet Take 1 tablet by mouth daily.      . Multiple Vitamin (MULTIVITAMIN) tablet Take 1 tablet by mouth daily.        . Naproxen Sodium (EQ NAPROXEN SODIUM) 220 MG CAPS Take 2 capsules by mouth every 12 (twelve) hours as needed (pain).      . pantoprazole (PROTONIX) 40 MG tablet Take 40 mg by mouth 2 (two) times daily.       . potassium chloride SA (K-DUR,KLOR-CON) 20 MEQ tablet Take 20 mEq by mouth 2 (two) times daily.       Marland Kitchen rOPINIRole (REQUIP) 2 MG tablet Take 2 mg by mouth at bedtime.      . VESICARE 10 MG tablet         No current facility-administered medications for this visit.    Past Medical History  Diagnosis Date  . Depression   . Hypertension   . Allergic rhinitis   . Fibroids     dysfuction uterine bleeding  . GERD (gastroesophageal reflux disease)   . RLS (restless legs syndrome)   . Osteoarthritis   . Fatty liver   . Hx of hysterectomy   . Factor V Leiden carrier 08/26/2011  . Protein S deficiency 08/26/2011    Past Surgical History  Procedure Laterality Date  . Rectu    . Tubal ligation      bilateral  . Lumbar spine surgery    . Cholecystectomy    . Urethral sling    . Knee surgery      right   ROS: As stated in the HPI and negative for all other systems.  PHYSICAL EXAM BP 132/62  Pulse 57  Ht 5\' 5"  (1.651 m)  Wt 233 lb 6.4 oz (105.87 kg)  BMI 38.84 kg/m2 GENERAL:  Well appearing NECK:  No jugular venous distention, waveform within normal limits, carotid upstroke brisk and symmetric, no bruits, no thyromegaly LYMPHATICS:  No cervical, inguinal adenopathy  LUNGS:  Clear to auscultation bilaterally BACK:  No CVA tenderness CHEST:  Unremarkable HEART:  PMI not displaced or sustained,S1 and S2 within normal limits, no S3, no S4, no clicks, no rubs, no murmurs ABD:  Flat, positive bowel sounds normal in frequency in pitch, no bruits, no rebound, no guarding, no midline pulsatile mass, no hepatomegaly, no splenomegaly EXT:  2 plus pulses throughout, trace, no cyanosis no clubbing SKIN:  No rashes no nodules NEURO:  Cranial nerves II through XII grossly intact, motor grossly intact throughout [facility]:  Cognitively intact, oriented to person place and time  EKG:  Sinus rhythm, rate 57, axis within normal limits, intervals within normal limits, no acute ST-T wave changes  08/14/2013   ASSESSMENT AND PLAN  BRADYCARDIA -  I will check a POET (Plain Old Exercise Treadmill) to make sure that she has chronotropic competence.  This can also look for any high risk dining such as  falling blood pressure given her history of septal hypertrophy. Of note this hypertrophy has been without gradient and I expect that it's not causing any symptoms. I will also place a 24-hour Holter.   HYPERTENSION -  The blood pressure is at target. No change in medications is indicated. We will continue with therapeutic lifestyle changes (TLC).   SEPTAL HYPERTROPHY - As above.  I will follow this clinically.  No echo at this time.

## 2013-08-14 NOTE — Patient Instructions (Addendum)
The current medical regimen is effective;  continue present plan and medications.  Your physician has requested that you have an exercise tolerance test. For further information please visit [email or web address]. Please also follow instruction sheet, as given.  Your physician has recommended that you wear a holter monitor. Holter monitors are medical devices that record the heart's electrical activity. Doctors most often use these monitors to diagnose arrhythmias. Arrhythmias are problems with the speed or rhythm of the heartbeat. The monitor is a small, portable device. You can wear one while you do your normal daily activities. This is usually used to diagnose what is causing palpitations/syncope (passing out).  Please have blood today. (TSH)  Follow up in 1 year with Dr Percival Spanish.  You will receive a letter in the mail 2 months before you are due.  Please call us when you receive this letter to schedule your follow up appointment.

## 2013-08-15 ENCOUNTER — Encounter (INDEPENDENT_AMBULATORY_CARE_PROVIDER_SITE_OTHER): Payer: BC Managed Care – PPO

## 2013-08-15 ENCOUNTER — Encounter: Payer: Self-pay | Admitting: *Deleted

## 2013-08-15 ENCOUNTER — Encounter: Payer: Self-pay | Admitting: Cardiology

## 2013-08-15 ENCOUNTER — Other Ambulatory Visit: Payer: Self-pay

## 2013-08-15 DIAGNOSIS — I495 Sick sinus syndrome: Secondary | ICD-10-CM

## 2013-08-15 DIAGNOSIS — R42 Dizziness and giddiness: Secondary | ICD-10-CM

## 2013-08-15 DIAGNOSIS — R0602 Shortness of breath: Secondary | ICD-10-CM

## 2013-08-15 NOTE — Progress Notes (Signed)
Patient ID: [name] [name], female   DOB: [birth year], 57 y.o.   MRN: [id number] E-Cardio 24 hour holter monitor applied to patient.

## 2013-08-18 ENCOUNTER — Telehealth: Payer: Self-pay | Admitting: Cardiology

## 2013-08-18 NOTE — Telephone Encounter (Signed)
Notified of lab results.  Sent copy to Dr. Harlan Stains

## 2013-08-18 NOTE — Telephone Encounter (Signed)
New message ° ° ° ° °Want lab results °

## 2013-08-23 ENCOUNTER — Ambulatory Visit: Payer: BC Managed Care – PPO | Admitting: Cardiology

## 2013-08-28 ENCOUNTER — Telehealth (HOSPITAL_COMMUNITY): Payer: Self-pay

## 2013-08-29 ENCOUNTER — Encounter (HOSPITAL_COMMUNITY): Payer: BC Managed Care – PPO

## 2013-08-30 ENCOUNTER — Ambulatory Visit (HOSPITAL_COMMUNITY)
Admission: RE | Admit: 2013-08-30 | Discharge: 2013-08-30 | Disposition: A | Discharge status: Home / Self Care | Payer: BC Managed Care – PPO | Source: Ambulatory Visit | Attending: Cardiovascular Disease | Admitting: Cardiovascular Disease

## 2013-08-30 DIAGNOSIS — R079 Chest pain, unspecified: Secondary | ICD-10-CM | POA: Insufficient documentation

## 2013-08-30 DIAGNOSIS — R0602 Shortness of breath: Secondary | ICD-10-CM | POA: Insufficient documentation

## 2013-09-01 ENCOUNTER — Telehealth: Payer: Self-pay | Admitting: *Deleted

## 2013-09-01 DIAGNOSIS — G4730 Sleep apnea, unspecified: Secondary | ICD-10-CM

## 2013-09-01 NOTE — Telephone Encounter (Signed)
Mild sleep apnea. I would suggest an appt with Dr. Gwenette Greet since she has had this diagnosis before.   Left message to call back 6/5

## 2013-09-04 NOTE — Telephone Encounter (Signed)
Follow up     Returning a nurses call to get test results

## 2013-09-04 NOTE — Telephone Encounter (Signed)
Pt is aware of stress test results. Results route to Dr. Emeline General. White MD. Pt aware

## 2013-09-05 ENCOUNTER — Telehealth: Payer: Self-pay | Admitting: Cardiology

## 2013-09-05 NOTE — Telephone Encounter (Signed)
New message     Want monitor results 

## 2013-09-05 NOTE — Telephone Encounter (Signed)
NSR - rare ectopy - sinus brady while asleep Needs follow up appt.

## 2013-09-05 NOTE — Telephone Encounter (Signed)
Returned call to patient no answer.Left message on personal voice mail Dr.Hochrein's nurse will be in office this afternoon will send message to her.

## 2013-09-07 NOTE — Telephone Encounter (Signed)
Left message for pt to call back to discuss results and that Dr Percival Spanish would like to see her back - 6/12 at 3 pm if possible.

## 2013-09-08 ENCOUNTER — Ambulatory Visit (INDEPENDENT_AMBULATORY_CARE_PROVIDER_SITE_OTHER): Payer: BC Managed Care – PPO | Admitting: Cardiology

## 2013-09-08 ENCOUNTER — Encounter: Payer: Self-pay | Admitting: Cardiology

## 2013-09-08 VITALS — BP 124/65 | HR 50 | Ht 64.0 in | Wt 244.0 lb

## 2013-09-08 DIAGNOSIS — R000 Tachycardia, unspecified: Secondary | ICD-10-CM

## 2013-09-08 NOTE — Progress Notes (Signed)
HPI The patient presents for evaluation of tachycardia.  I saw her recently for bradycardia.  POET (Plain Old Exercise Treadmill) demonstrated normal heart rate response.  Holter demonstrated no bradyarrhythmias but did demonstrate some episodes of tachycardia.  This appeared to be sinus tachycardia.  She did report that she thinks that she was lifting something heavy at that time.  She says that she does feel her heart racing at times at rest but does not notice it when she is active.  She denies presyncope or syncope.    No Known Allergies  Current Outpatient Prescriptions  Medication Sig Dispense Refill  . aspirin EC 81 MG tablet Take 81 mg by mouth daily.      Marland Kitchen estradiol (ESTRACE) 1 MG tablet Take 0.5 mg by mouth daily.       Marland Kitchen FLUoxetine (PROZAC) 10 MG capsule Take 30 mg by mouth daily.       Marland Kitchen ibuprofen (ADVIL,MOTRIN) 200 MG tablet Take 400 mg by mouth every 6 (six) hours as needed. For fever      . lisinopril-hydrochlorothiazide (PRINZIDE,ZESTORETIC) 20-25 MG per tablet Take 1 tablet by mouth daily.      . Multiple Vitamin (MULTIVITAMIN) tablet Take 1 tablet by mouth daily.        . Naproxen Sodium (EQ NAPROXEN SODIUM) 220 MG CAPS Take 2 capsules by mouth every 12 (twelve) hours as needed (pain).      . pantoprazole (PROTONIX) 40 MG tablet Take 40 mg by mouth 2 (two) times daily.       . potassium chloride SA (K-DUR,KLOR-CON) 20 MEQ tablet Take 20 mEq by mouth 2 (two) times daily.       Marland Kitchen rOPINIRole (REQUIP) 2 MG tablet Take 2 mg by mouth at bedtime.      . VESICARE 10 MG tablet        No current facility-administered medications for this visit.    Past Medical History  Diagnosis Date  . Depression   . Hypertension   . Allergic rhinitis   . Fibroids     dysfuction uterine bleeding  . GERD (gastroesophageal reflux disease)   . RLS (restless legs syndrome)   . Osteoarthritis   . Fatty liver   . Factor V Leiden carrier 08/26/2011  . Protein S deficiency 08/26/2011    Past  Surgical History  Procedure Laterality Date  . Rectu    . Tubal ligation      bilateral  . Lumbar spine surgery    . Cholecystectomy    . Urethral sling    . Knee surgery      right   ROS: As stated in the HPI and negative for all other systems.  PHYSICAL EXAM BP 124/65  Pulse 50  Ht 5\' 4"  (1.626 m)  Wt 244 lb (110.678 kg)  BMI 41.86 kg/m2 GENERAL:  Well appearing NECK:  No jugular venous distention, waveform within normal limits, carotid upstroke brisk and symmetric, no bruits, no thyromegaly LYMPHATICS:  No cervical, inguinal adenopathy LUNGS:  Clear to auscultation bilaterally BACK:  No CVA tenderness CHEST:  Unremarkable HEART:  PMI not displaced or sustained,S1 and S2 within normal limits, no S3, no S4, no clicks, no rubs, no murmurs ABD:  Flat, positive bowel sounds normal in frequency in pitch, no bruits, no rebound, no guarding, no midline pulsatile mass, no hepatomegaly, no splenomegaly EXT:  2 plus pulses throughout, trace, no cyanosis no clubbing SKIN:  No rashes no nodules NEURO:  Cranial nerves II through  XII grossly intact, motor grossly intact throughout PSYCH:  Cognitively intact, oriented to person place and time  EKG:  Sinus rhythm, rate 57, axis within normal limits, intervals within normal limits, no acute ST-T wave changes  09/08/2013   ASSESSMENT AND PLAN  BRADYCARDIA -  The patient had normal heart rate response on POET (Plain Old Exercise Treadmill).  There were no symptomatic bradyarrhythmias.  No further evaluation is indicated.    HYPERTENSION -  The blood pressure is at target. No change in medications is indicated. We will continue with therapeutic lifestyle changes (TLC).   SEPTAL HYPERTROPHY - As above.  I will follow this clinically.  No echo at this time.   TACHYCARDIA - The patient is have rare symptoms but is otherwise doing OK.  Given the fact that tachycardia episodes seem to be with activity and not sustained no change in therapy or  further evaluation is planned at this point.

## 2013-09-08 NOTE — Telephone Encounter (Signed)
Pt was scheduled to be seen today by Dr Percival Spanish.

## 2013-09-08 NOTE — Patient Instructions (Signed)
The current medical regimen is effective;  continue present plan and medications.  Follow up in 1 year with Dr Hochrein.  You will receive a letter in the mail 2 months before you are due.  Please call us when you receive this letter to schedule your follow up appointment.  

## 2013-09-28 ENCOUNTER — Institutional Professional Consult (permissible substitution): Payer: BC Managed Care – PPO | Admitting: Pulmonary Disease

## 2013-09-28 NOTE — Telephone Encounter (Signed)
Encounter complete. 

## 2013-10-08 ENCOUNTER — Observation Stay (HOSPITAL_COMMUNITY)
Admission: EM | Admit: 2013-10-08 | Discharge: 2013-10-09 | Disposition: A | Discharge status: Home / Self Care | Payer: BC Managed Care – PPO | Attending: Cardiovascular Disease | Admitting: Cardiovascular Disease

## 2013-10-08 ENCOUNTER — Emergency Department (HOSPITAL_COMMUNITY): Payer: BC Managed Care – PPO

## 2013-10-08 ENCOUNTER — Encounter (HOSPITAL_COMMUNITY): Payer: Self-pay | Admitting: Emergency Medicine

## 2013-10-08 DIAGNOSIS — K7689 Other specified diseases of liver: Secondary | ICD-10-CM | POA: Insufficient documentation

## 2013-10-08 DIAGNOSIS — I10 Essential (primary) hypertension: Secondary | ICD-10-CM | POA: Insufficient documentation

## 2013-10-08 DIAGNOSIS — F329 Major depressive disorder, single episode, unspecified: Secondary | ICD-10-CM | POA: Insufficient documentation

## 2013-10-08 DIAGNOSIS — M1990 Unspecified osteoarthritis, unspecified site: Secondary | ICD-10-CM | POA: Insufficient documentation

## 2013-10-08 DIAGNOSIS — R079 Chest pain, unspecified: Secondary | ICD-10-CM | POA: Diagnosis present

## 2013-10-08 DIAGNOSIS — D6859 Other primary thrombophilia: Secondary | ICD-10-CM | POA: Insufficient documentation

## 2013-10-08 DIAGNOSIS — Z79899 Other long term (current) drug therapy: Secondary | ICD-10-CM | POA: Insufficient documentation

## 2013-10-08 DIAGNOSIS — F3289 Other specified depressive episodes: Secondary | ICD-10-CM | POA: Insufficient documentation

## 2013-10-08 DIAGNOSIS — Z7982 Long term (current) use of aspirin: Secondary | ICD-10-CM | POA: Insufficient documentation

## 2013-10-08 DIAGNOSIS — R0789 Other chest pain: Principal | ICD-10-CM | POA: Insufficient documentation

## 2013-10-08 DIAGNOSIS — G2581 Restless legs syndrome: Secondary | ICD-10-CM | POA: Insufficient documentation

## 2013-10-08 DIAGNOSIS — Z8742 Personal history of other diseases of the female genital tract: Secondary | ICD-10-CM | POA: Insufficient documentation

## 2013-10-08 DIAGNOSIS — R42 Dizziness and giddiness: Secondary | ICD-10-CM | POA: Insufficient documentation

## 2013-10-08 DIAGNOSIS — R109 Unspecified abdominal pain: Secondary | ICD-10-CM | POA: Insufficient documentation

## 2013-10-08 DIAGNOSIS — R0602 Shortness of breath: Secondary | ICD-10-CM | POA: Insufficient documentation

## 2013-10-08 DIAGNOSIS — E669 Obesity, unspecified: Secondary | ICD-10-CM | POA: Diagnosis present

## 2013-10-08 DIAGNOSIS — K219 Gastro-esophageal reflux disease without esophagitis: Secondary | ICD-10-CM | POA: Insufficient documentation

## 2013-10-08 LAB — CBC
HCT: 41.8 % (ref 36.0–46.0)
Hemoglobin: 13.7 g/dL (ref 12.0–15.0)
MCH: 28.6 pg (ref 26.0–34.0)
MCHC: 32.8 g/dL (ref 30.0–36.0)
MCV: 87.3 fL (ref 78.0–100.0)
Platelets: 191 10*3/uL (ref 150–400)
RBC: 4.79 MIL/uL (ref 3.87–5.11)
RDW: 15.1 % (ref 11.5–15.5)
WBC: 9.7 10*3/uL (ref 4.0–10.5)

## 2013-10-08 LAB — TROPONIN I: Troponin I: <0.3 ng/mL (ref <0.30)

## 2013-10-08 LAB — I-STAT CHEM 8, ED
BUN: 21 mg/dL (ref 6–23)
CALCIUM ION: 1.2 mmol/L (ref 1.13–1.30)
CHLORIDE: 102 meq/L (ref 96–112)
Creatinine, Ser: 1.1 mg/dL (ref 0.50–1.10)
Glucose, Bld: 84 mg/dL (ref 70–99)
HCT: 45 % (ref 36.0–46.0)
Hemoglobin: 15.3 g/dL — ABNORMAL HIGH (ref 12.0–15.0)
Potassium: 4 mEq/L (ref 3.7–5.3)
Sodium: 138 mEq/L (ref 137–147)
TCO2: 23 mmol/L (ref 0–100)

## 2013-10-08 LAB — I-STAT TROPONIN, ED: Troponin i, poc: 0 ng/mL (ref 0.00–0.08)

## 2013-10-08 MED ORDER — SODIUM CHLORIDE 0.9 % IV SOLN: 250.0000 mL | INTRAVENOUS | Status: DC | PRN

## 2013-10-08 MED ORDER — SODIUM CHLORIDE 0.9 % IJ SOLN: 3.0000 mL | Freq: Two times a day (BID) | INTRAMUSCULAR | Status: DC

## 2013-10-08 MED ORDER — ASPIRIN 81 MG PO CHEW
324.0000 mg | CHEWABLE_TABLET | Freq: Once | ORAL | Status: AC
  Administered 2013-10-08: 324 mg via ORAL
  Filled 2013-10-08: qty 4

## 2013-10-08 MED ORDER — SODIUM CHLORIDE 0.9 % IJ SOLN: 3.0000 mL | INTRAMUSCULAR | Status: DC | PRN

## 2013-10-08 MED ORDER — DARIFENACIN HYDROBROMIDE ER 15 MG PO TB24
15.0000 mg | ORAL_TABLET | Freq: Every day | ORAL | Status: DC
  Administered 2013-10-09: 15 mg via ORAL
  Filled 2013-10-08: qty 1

## 2013-10-08 MED ORDER — ESTRADIOL 1 MG PO TABS
0.5000 mg | ORAL_TABLET | Freq: Every day | ORAL | Status: DC
  Filled 2013-10-08: qty 0.5

## 2013-10-08 MED ORDER — ACETAMINOPHEN 325 MG PO TABS: 650.0000 mg | ORAL_TABLET | Freq: Four times a day (QID) | ORAL | Status: DC | PRN

## 2013-10-08 MED ORDER — POTASSIUM CHLORIDE CRYS ER 20 MEQ PO TBCR
20.0000 meq | EXTENDED_RELEASE_TABLET | Freq: Two times a day (BID) | ORAL | Status: DC
  Administered 2013-10-08 – 2013-10-09 (×2): 20 meq via ORAL
  Filled 2013-10-08 (×3): qty 1

## 2013-10-08 MED ORDER — LISINOPRIL 20 MG PO TABS
20.0000 mg | ORAL_TABLET | Freq: Every day | ORAL | Status: DC
  Administered 2013-10-09: 20 mg via ORAL
  Filled 2013-10-08: qty 1

## 2013-10-08 MED ORDER — GI COCKTAIL ~~LOC~~
30.0000 mL | Freq: Once | ORAL | Status: AC
  Administered 2013-10-08: 30 mL via ORAL
  Filled 2013-10-08: qty 30

## 2013-10-08 MED ORDER — ONDANSETRON HCL 4 MG/2ML IJ SOLN: 4.0000 mg | Freq: Four times a day (QID) | INTRAMUSCULAR | Status: DC | PRN

## 2013-10-08 MED ORDER — PANTOPRAZOLE SODIUM 40 MG PO TBEC
40.0000 mg | DELAYED_RELEASE_TABLET | Freq: Two times a day (BID) | ORAL | Status: DC
  Administered 2013-10-08 – 2013-10-09 (×3): 40 mg via ORAL
  Filled 2013-10-08 (×3): qty 1

## 2013-10-08 MED ORDER — HEPARIN SODIUM (PORCINE) 5000 UNIT/ML IJ SOLN
5000.0000 [IU] | Freq: Three times a day (TID) | INTRAMUSCULAR | Status: DC
Start: 2013-10-08 — End: 2013-10-09
  Administered 2013-10-08 – 2013-10-09 (×2): 5000 [IU] via SUBCUTANEOUS
  Filled 2013-10-08 (×5): qty 1

## 2013-10-08 MED ORDER — NITROGLYCERIN 0.4 MG SL SUBL
0.4000 mg | SUBLINGUAL_TABLET | SUBLINGUAL | Status: DC | PRN
  Administered 2013-10-08 (×2): 0.4 mg via SUBLINGUAL
  Filled 2013-10-08 (×2): qty 1

## 2013-10-08 MED ORDER — PANTOPRAZOLE SODIUM 40 MG PO TBEC: 40.0000 mg | DELAYED_RELEASE_TABLET | Freq: Every day | ORAL | Status: DC

## 2013-10-08 MED ORDER — LISINOPRIL-HYDROCHLOROTHIAZIDE 20-25 MG PO TABS: 1.0000 | ORAL_TABLET | Freq: Every day | ORAL | Status: DC

## 2013-10-08 MED ORDER — LORATADINE 10 MG PO TABS
10.0000 mg | ORAL_TABLET | Freq: Every day | ORAL | Status: DC | PRN
Start: 2013-10-08 — End: 2013-10-09
  Filled 2013-10-08 (×2): qty 1

## 2013-10-08 MED ORDER — ALUM & MAG HYDROXIDE-SIMETH 200-200-20 MG/5ML PO SUSP
15.0000 mL | Freq: Four times a day (QID) | ORAL | Status: DC | PRN
  Administered 2013-10-08: 15 mL via ORAL
  Filled 2013-10-08: qty 30

## 2013-10-08 MED ORDER — FLUOXETINE HCL 20 MG PO CAPS
20.0000 mg | ORAL_CAPSULE | Freq: Every day | ORAL | Status: DC
  Administered 2013-10-09: 20 mg via ORAL
  Filled 2013-10-08: qty 1

## 2013-10-08 MED ORDER — ROPINIROLE HCL 1 MG PO TABS
2.0000 mg | ORAL_TABLET | Freq: Every evening | ORAL | Status: DC | PRN
  Filled 2013-10-08: qty 2

## 2013-10-08 MED ORDER — HYDROCHLOROTHIAZIDE 25 MG PO TABS
25.0000 mg | ORAL_TABLET | Freq: Every day | ORAL | Status: DC
  Administered 2013-10-09: 25 mg via ORAL
  Filled 2013-10-08: qty 1

## 2013-10-08 MED ORDER — ASPIRIN EC 81 MG PO TBEC
81.0000 mg | DELAYED_RELEASE_TABLET | Freq: Every day | ORAL | Status: DC
  Administered 2013-10-09: 81 mg via ORAL
  Filled 2013-10-08: qty 1

## 2013-10-08 NOTE — ED Provider Notes (Signed)
CSN: [id number]     Arrival date & time 10/08/13  1216 History   First MD Initiated Contact with Patient 10/08/13 1301     Chief Complaint  Patient presents with  . Chest Pain  . Abdominal Pain     (Consider location/radiation/quality/duration/timing/severity/associated sxs/prior Treatment) HPI Comments: Patient history of hypertension and factor V Leiden deficiency presents with indigestion. She's having a pressure feeling in her lower chest. It radiates between her shoulder blades. She's also had some associated dizziness and some shortness of breath. She denies any nausea vomiting or diaphoresis. She states the discomfort woke her up during the night and then started back again about 8:00 this morning. It's been intermittent throughout the day. It's nonexertional. She took some Protonix but it did not seem to help her symptoms. She has seen Dr. Percival Spanish in the past for her bradycardia. She had a recent exercise treadmill test in May. The only documentation I can find on this is that she had a normal heart rate response.  Patient is a 58 y.o. female presenting with chest pain and abdominal pain.  Chest Pain Associated symptoms: shortness of breath   Associated symptoms: no abdominal pain, no back pain, no cough, no diaphoresis, no dizziness, no fatigue, no fever, no headache, no nausea, no numbness, not vomiting and no weakness   Abdominal Pain Associated symptoms: chest pain and shortness of breath   Associated symptoms: no chills, no cough, no diarrhea, no fatigue, no fever, no hematuria, no nausea and no vomiting     Past Medical History  Diagnosis Date  . Depression   . Hypertension   . Allergic rhinitis   . Fibroids     dysfuction uterine bleeding  . GERD (gastroesophageal reflux disease)   . RLS (restless legs syndrome)   . Osteoarthritis   . Fatty liver   . Factor V Leiden carrier 08/26/2011  . Protein S deficiency 08/26/2011   Past Surgical History  Procedure Laterality  Date  . Rectu    . Tubal ligation      bilateral  . Lumbar spine surgery    . Cholecystectomy    . Urethral sling    . Knee surgery      right   Family History  Problem Relation Age of Onset  . Coronary artery disease Father 69    deceased- TIAs  . Hypertension Mother 58    alive  . Deep vein thrombosis Mother   . Neuropathy Mother   . Osteoporosis Mother    History  Substance Use Topics  . Smoking status: Never Smoker   . Smokeless tobacco: Not on file  . Alcohol Use: No   OB History   Grav Para Term Preterm Abortions TAB SAB Ect Mult Living                 Review of Systems  Constitutional: Negative for fever, chills, diaphoresis and fatigue.  HENT: Negative for congestion, rhinorrhea and sneezing.   Eyes: Negative.   Respiratory: Positive for chest tightness and shortness of breath. Negative for cough.   Cardiovascular: Positive for chest pain. Negative for leg swelling.  Gastrointestinal: Negative for nausea, vomiting, abdominal pain, diarrhea and blood in stool.  Genitourinary: Negative for frequency, hematuria, flank pain and difficulty urinating.  Musculoskeletal: Negative for arthralgias and back pain.  Skin: Negative for rash.  Neurological: Positive for light-headedness. Negative for dizziness, speech difficulty, weakness, numbness and headaches.      Allergies  Review of patient's allergies indicates  no known allergies.  Home Medications   Prior to Admission medications   Medication Sig Start Date End Date Taking? Authorizing Provider  acetaminophen (TYLENOL) 325 MG tablet Take 650 mg by mouth every 6 (six) hours as needed for mild pain.   Yes Historical Provider, MD  aspirin EC 81 MG tablet Take 81 mg by mouth daily.   Yes Historical Provider, MD  cetirizine (ZYRTEC) 10 MG tablet Take 10 mg by mouth as needed for allergies.   Yes Historical Provider, MD  estradiol (ESTRACE) 0.5 MG tablet Take 0.5 mg by mouth daily.   Yes Historical Provider, MD   FLUoxetine (PROZAC) 20 MG capsule Take 20 mg by mouth daily.   Yes Historical Provider, MD  lisinopril-hydrochlorothiazide (PRINZIDE,ZESTORETIC) 20-25 MG per tablet Take 1 tablet by mouth daily.   Yes Historical Provider, MD  pantoprazole (PROTONIX) 40 MG tablet Take 40 mg by mouth 2 (two) times daily.    Yes Historical Provider, MD  potassium chloride SA (K-DUR,KLOR-CON) 20 MEQ tablet Take 20 mEq by mouth 2 (two) times daily.    Yes Historical Provider, MD  rOPINIRole (REQUIP) 2 MG tablet Take 2 mg by mouth at bedtime.   Yes Historical Provider, MD  solifenacin (VESICARE) 10 MG tablet Take 10 mg by mouth daily.   Yes Historical Provider, MD   BP 123/59  Pulse 84  Temp(Src) 97.8 F (36.6 C) (Oral)  Resp 15  Ht 5' 4.5" (1.638 m)  Wt 246 lb 3 oz (111.67 kg)  BMI 41.62 kg/m2  SpO2 95% Physical Exam  Constitutional: She is oriented to person, place, and time. She appears well-developed and well-nourished.  HENT:  Head: Normocephalic and atraumatic.  Eyes: Pupils are equal, round, and reactive to light.  Neck: Normal range of motion. Neck supple.  Cardiovascular: Normal rate, regular rhythm and normal heart sounds.   Pulmonary/Chest: Effort normal and breath sounds normal. No respiratory distress. She has no wheezes. She has no rales. She exhibits no tenderness.  Abdominal: Soft. Bowel sounds are normal. There is no tenderness. There is no rebound and no guarding.  Musculoskeletal: Normal range of motion. She exhibits no edema and no tenderness.  No calf tenderness  Lymphadenopathy:    She has no cervical adenopathy.  Neurological: She is alert and oriented to person, place, and time.  Skin: Skin is warm and dry. No rash noted.  Psychiatric: She has a normal mood and affect.    ED Course  Procedures (including critical care time) Labs Review Results for orders placed during the hospital encounter of 10/08/13  CBC      Result Value Ref Range   WBC 9.7  4.0 - 10.5 K/uL   RBC 4.79   3.87 - 5.11 MIL/uL   Hemoglobin 13.7  12.0 - 15.0 g/dL   HCT 41.8  36.0 - 46.0 %   MCV 87.3  78.0 - 100.0 fL   MCH 28.6  26.0 - 34.0 pg   MCHC 32.8  30.0 - 36.0 g/dL   RDW 15.1  11.5 - 15.5 %   Platelets 191  150 - 400 K/uL  I-STAT TROPOININ, ED      Result Value Ref Range   Troponin i, poc 0.00  0.00 - 0.08 ng/mL   Comment 3           I-STAT CHEM 8, ED      Result Value Ref Range   Sodium 138  137 - 147 mEq/L   Potassium 4.0  3.7 -  5.3 mEq/L   Chloride 102  96 - 112 mEq/L   BUN 21  6 - 23 mg/dL   Creatinine, Ser 1.10  0.50 - 1.10 mg/dL   Glucose, Bld 84  70 - 99 mg/dL   Calcium, Ion 1.20  1.13 - 1.30 mmol/L   TCO2 23  0 - 100 mmol/L   Hemoglobin 15.3 (*) 12.0 - 15.0 g/dL   HCT 45.0  36.0 - 46.0 %   No results found.    Imaging Review No results found.   EKG Interpretation   Date/Time:  Sunday October 08 2013 13:20:20 EDT Ventricular Rate:  63 PR Interval:  146 QRS Duration: 88 QT Interval:  402 QTC Calculation: 411 R Axis:   -12 Text Interpretation:  Sinus rhythm Low voltage, precordial leads since  last tracing no significant change Confirmed by Rahshawn Remo  MD, Leshawn Houseworth ([id number])  on 10/08/2013 2:14:08 PM      MDM   Final diagnoses:  Chest pain, unspecified chest pain type    Pt given ASA, discomfort has been relieved with nitro.  Dr. Wynonia Lawman to see.    Malvin Johns, MD 10/08/13 [phone]

## 2013-10-08 NOTE — ED Notes (Signed)
Cardiology at bedside.

## 2013-10-08 NOTE — H&P (Signed)
Physician History and Physical     Patient ID: [name] [name] MRN: [id number] DOB/AGE: [birth year] 58 y.o. Admit date: 10/08/2013  Primary Care Physician: Vidal Schwalbe, MD Primary Cardiologist:  Marijo File  Active Problems:   * No active hospital problems. *   HPI:   Obese 58 yo female seen in ER for indigestion/chest pain.  Last saw [facility] May.  Had normal stress test at [facility] a few years ago before right TKR.  Saw [facility] for ? Bradycardia. Holter benign  Had ETT in May with normal chronotropic response and no ischemia.  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  No immediate indigestion but at night had bad pain.  Took antacid and relieved but then recurred two times with less antacid relief and came to ER  Also noted hives on legs and improved with Zyrtec.  No dyspnea or palpitations.  No history of GI issues GERD, Reflux or PUD.  ? Of Factor V Leiden carrier and Protein S deficiency but not anticoagulated CRF HTN  Compliant with ACE/diuretic.  No history of GBD  Despite R TKR she did well on ETT in May with exaggerated HR response due to deconditioning.    Review of systems complete and found to be negative unless listed above   Past Medical History  Diagnosis Date  . Depression   . Hypertension   . Allergic rhinitis   . Fibroids     dysfuction uterine bleeding  . GERD (gastroesophageal reflux disease)   . RLS (restless legs syndrome)   . Osteoarthritis   . Fatty liver   . Factor V Leiden carrier 08/26/2011  . Protein S deficiency 08/26/2011    Family History  Problem Relation Age of Onset  . Coronary artery disease Father 6    deceased- TIAs  . Hypertension Mother 42    alive  . Deep vein thrombosis Mother   . Neuropathy Mother   . Osteoporosis Mother     History   Social History  . Marital Status: Married    Spouse Name: N/A    Number of Children: 2  . Years of Education: N/A   Occupational History  .  [employer]   Social History Main Topics  . Smoking status: Never Smoker   . Smokeless tobacco: Not on file  . Alcohol Use: No  . Drug Use: No  . Sexual Activity: Not on file   Other Topics Concern  . Not on file   Social History Narrative  . No narrative on file    Past Surgical History  Procedure Laterality Date  . Rectu    . Tubal ligation      bilateral  . Lumbar spine surgery    . Cholecystectomy    . Urethral sling    . Knee surgery      right      (Not in a hospital admission)  Physical Exam: Blood pressure 125/59, pulse 77, temperature 97.8 F (36.6 C), temperature source Oral, resp. rate 17, height 5' 4.5" (1.638 m), weight 246 lb 3 oz (111.67 kg), SpO2 98.00%.  Affect appropriate Obese white female  HEENT: normal Neck supple with no adenopathy JVP normal no bruits no thyromegaly Lungs clear with no wheezing and good diaphragmatic motion Heart:  S1/S2 no murmur, no rub, gallop or click PMI normal Abdomen: benighn, BS positve, no tenderness, no AAA no bruit.  No HSM or HJR Distal pulses  intact with no bruits No edema Neuro non-focal Skin warm and dry No muscular weakness  No current facility-administered medications on file prior to encounter.   Current Outpatient Prescriptions on File Prior to Encounter  Medication Sig Dispense Refill  . aspirin EC 81 MG tablet Take 81 mg by mouth daily.      Marland Kitchen lisinopril-hydrochlorothiazide (PRINZIDE,ZESTORETIC) 20-25 MG per tablet Take 1 tablet by mouth daily.      . pantoprazole (PROTONIX) 40 MG tablet Take 40 mg by mouth 2 (two) times daily.       . potassium chloride SA (K-DUR,KLOR-CON) 20 MEQ tablet Take 20 mEq by mouth 2 (two) times daily.       Marland Kitchen rOPINIRole (REQUIP) 2 MG tablet Take 2 mg by mouth at bedtime.        Labs:   Lab Results  Component Value Date   WBC 9.7 10/08/2013   HGB 15.3* 10/08/2013   HCT 45.0 10/08/2013   MCV 87.3 10/08/2013   PLT 191 10/08/2013    Recent  Labs Lab 10/08/13 1333  NA 138  K 4.0  CL 102  BUN 21  CREATININE 1.10  GLUCOSE 84       Radiology: No results found.  EKG:  NSR normal ECG   ASSESSMENT AND PLAN:  Chest Pain:  Atypical with GI overtones  Recurrent despite antacids  ? Ppt by [place] [employer].  Start protonix 40mg .  PRN antacids  Check GBUS given body habitus.  No RUQ or epigastric  Pain and WBC ok.  Exercise myovue in am  Her large chest size makes CT less appealing  HTN:  Continue zestoretic  Bradycardia:  Resolved HR 53 no conduction abnormalities on ECG and normal HR response to exercise  Avoid beta blockers   Signed: Collier Salina Nishan7/02/2014, 3:43 PM

## 2013-10-08 NOTE — ED Notes (Signed)
Unable to draw labs when starting IV, phlebotomy notified

## 2013-10-08 NOTE — ED Notes (Signed)
Ordered diet tray 

## 2013-10-08 NOTE — ED Notes (Signed)
Pt. Stated, i started having upper abd. Pain into my chest last night, so I took my indigestion medicine and it helped some but then it came back again this morning.

## 2013-10-09 ENCOUNTER — Observation Stay (HOSPITAL_COMMUNITY): Payer: BC Managed Care – PPO

## 2013-10-09 ENCOUNTER — Encounter (HOSPITAL_COMMUNITY): Payer: BC Managed Care – PPO

## 2013-10-09 ENCOUNTER — Encounter (HOSPITAL_COMMUNITY): Payer: Self-pay | Admitting: *Deleted

## 2013-10-09 DIAGNOSIS — R079 Chest pain, unspecified: Secondary | ICD-10-CM

## 2013-10-09 DIAGNOSIS — E669 Obesity, unspecified: Secondary | ICD-10-CM | POA: Diagnosis present

## 2013-10-09 LAB — BASIC METABOLIC PANEL
ANION GAP: 16 — AB (ref 5–15)
BUN: 22 mg/dL (ref 6–23)
CALCIUM: 9 mg/dL (ref 8.4–10.5)
CO2: 23 meq/L (ref 19–32)
Chloride: 100 mEq/L (ref 96–112)
Creatinine, Ser: 1 mg/dL (ref 0.50–1.10)
GFR calc Af Amer: 70 mL/min — ABNORMAL LOW (ref >90)
GFR calc non Af Amer: 60 mL/min — ABNORMAL LOW (ref >90)
Glucose, Bld: 134 mg/dL — ABNORMAL HIGH (ref 70–99)
POTASSIUM: 4.2 meq/L (ref 3.7–5.3)
SODIUM: 139 meq/L (ref 137–147)

## 2013-10-09 LAB — TROPONIN I

## 2013-10-09 LAB — CBC
HCT: 38.8 % (ref 36.0–46.0)
Hemoglobin: 12.9 g/dL (ref 12.0–15.0)
MCH: 29.1 pg (ref 26.0–34.0)
MCHC: 33.2 g/dL (ref 30.0–36.0)
MCV: 87.6 fL (ref 78.0–100.0)
PLATELETS: 177 10*3/uL (ref 150–400)
RBC: 4.43 MIL/uL (ref 3.87–5.11)
RDW: 15.2 % (ref 11.5–15.5)
WBC: 10 10*3/uL (ref 4.0–10.5)

## 2013-10-09 MED ORDER — GI COCKTAIL ~~LOC~~
30.0000 mL | Freq: Two times a day (BID) | ORAL | Status: DC | PRN
  Administered 2013-10-09: 30 mL via ORAL
  Filled 2013-10-09: qty 30

## 2013-10-09 MED ORDER — FAMOTIDINE IN NACL 20-0.9 MG/50ML-% IV SOLN
20.0000 mg | INTRAVENOUS | Status: AC
  Administered 2013-10-09: 20 mg via INTRAVENOUS
  Filled 2013-10-09: qty 50

## 2013-10-09 MED ORDER — DIPHENHYDRAMINE HCL 50 MG/ML IJ SOLN: 25.0000 mg | Freq: Once | INTRAMUSCULAR | Status: DC

## 2013-10-09 MED ORDER — TECHNETIUM TC 99M SESTAMIBI GENERIC - CARDIOLITE
10.0000 | Freq: Once | INTRAVENOUS | Status: AC | PRN
  Administered 2013-10-09: 10 via INTRAVENOUS

## 2013-10-09 MED ORDER — DIPHENHYDRAMINE HCL 25 MG PO CAPS: 25.0000 mg | ORAL_CAPSULE | Freq: Four times a day (QID) | ORAL | Status: DC | PRN

## 2013-10-09 MED ORDER — METHYLPREDNISOLONE SODIUM SUCC 125 MG IJ SOLR
INTRAMUSCULAR | Status: AC
  Administered 2013-10-09: 60 mg via INTRAVENOUS
  Filled 2013-10-09: qty 2

## 2013-10-09 MED ORDER — DIPHENHYDRAMINE HCL 25 MG PO CAPS
25.0000 mg | ORAL_CAPSULE | Freq: Once | ORAL | Status: AC
  Administered 2013-10-09: 25 mg via ORAL
  Filled 2013-10-09: qty 1

## 2013-10-09 MED ORDER — TECHNETIUM TC 99M SESTAMIBI GENERIC - CARDIOLITE
30.0000 | Freq: Once | INTRAVENOUS | Status: AC | PRN
  Administered 2013-10-09: 30 via INTRAVENOUS

## 2013-10-09 MED ORDER — METHYLPREDNISOLONE SODIUM SUCC 125 MG IJ SOLR
60.0000 mg | Freq: Once | INTRAMUSCULAR | Status: AC
  Administered 2013-10-09: 60 mg via INTRAVENOUS
  Filled 2013-10-09: qty 0.96

## 2013-10-09 MED ORDER — HYDROCORTISONE 1 % EX CREA
TOPICAL_CREAM | Freq: Three times a day (TID) | CUTANEOUS | Status: DC
  Administered 2013-10-09: 1 via TOPICAL
  Filled 2013-10-09: qty 28

## 2013-10-09 NOTE — Discharge Summary (Addendum)
Physician Discharge Summary      Patient ID: [name] [name] MRN: [id number] DOB/AGE: 28-Nov-1955 58 y.o.  Admit date: 10/08/2013 Discharge date: 10/09/2013  Admission Diagnoses:  Chest pain  Discharge Diagnoses:  Active Problems:   Chest pain   Obesity   Discharged Condition: stable  Hospital Course:   Obese 58 yo female seen in ER for indigestion/chest pain. Last saw [facility] May. Had normal stress test at [facility] a few years ago before right TKR. Saw [facility] for ? Bradycardia. Holter benign Had ETT in May with normal chronotropic response and no ischemia. 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 No immediate indigestion but at night had bad pain. Took antacid and relieved but then recurred two times with less antacid relief and came to ER Also noted hives on legs and improved with Zyrtec. No dyspnea or palpitations. No history of GI issues GERD, Reflux or PUD. ? Of Factor V Leiden carrier and Protein S deficiency but not anticoagulated CRF HTN Compliant with ACE/diuretic. No history of GBD Despite R TKR she did well on ETT in May with exaggerated HR response due to deconditioning.   She was admitted and ruled out for MI.  She underwent a treadmill myovue which was negative for ischemia.  She reported having hives on Saturday and some improvement after taking Zirtec.  After the stress test, and after she returned to her room, she redeveloped hives and subsequent edema in the right upper lip.  No SOB.  She was treated with 60mg  solumedrol, 25mg  benadryl PO, route which the patient requested, and 20mg  pepcid with improvement.  The patient was seen by Dr. Mare Ferrari who felt she was stable for DC home.  The patient will also be referred for medical nutrition therapy due to obesity.    Consults: None  Significant Diagnostic Studies: CLINICAL DATA: 58-year-old female with chest pain.  EXAM:  MYOCARDIAL IMAGING WITH SPECT (REST AND EXERCISE)  GATED  LEFT VENTRICULAR WALL MOTION STUDY  LEFT VENTRICULAR EJECTION FRACTION  TECHNIQUE:  Standard myocardial SPECT imaging was performed after resting  intravenous injection of 10 mCi Tc-38m sestamibi. Subsequently,  exercise tolerance test was performed by the patient under the  supervision of the Cardiology staff. At peak-stress, 30 mCi Tc-66m  sestamibi was injected intravenously and standard myocardial SPECT  imaging was performed. Quantitative gated imaging was also performed  to evaluate left ventricular wall motion, and estimate left  ventricular ejection fraction.  COMPARISON: Chest radiograph 10/08/2013  FINDINGS:  Perfusion: Decrease counts in the apical, mid, and basilar segments  of the lateral wall on rest and stress. No decreased counts within  the left ventricle to suggests reversible ischemia .  Wall Motion: No focal wall motion abnormality  Ejection Fraction: 74 %  End-diastolic volume equals: 66 ml  End systolic volume equals: 17 ml  IMPRESSION:  1. No evidence reversible ischemia. Decrease counts in the lateral  on rest and stress may be artifactual as there is no focal wall  motion abnormality.  2. Normal left ventricular wall motion.  3. Left ventricular ejection fraction equals 70%.  Treatments: See above  Discharge Exam: Blood pressure 123/56, pulse 64, temperature 98.3 F (36.8 C), temperature source Oral, resp. rate 19, height 5' 4.5" (1.638 m), weight 246 lb 3 oz (111.67 kg), SpO2 96.00%.   Disposition: 01-Home or Self Care      Discharge Instructions   Diet - low sodium heart healthy  Complete by:  As directed      Discharge instructions    Complete by:  As directed   Follow up with primary care provider if hives do not continue to improve.     Increase activity slowly    Complete by:  As directed             Medication List         acetaminophen 325 MG tablet  Commonly known as:  TYLENOL  Take 650 mg by mouth every 6 (six) hours as  needed for mild pain.     aspirin EC 81 MG tablet  Take 81 mg by mouth daily.     cetirizine 10 MG tablet  Commonly known as:  ZYRTEC  Take 10 mg by mouth as needed for allergies.     diphenhydrAMINE 25 mg capsule  Commonly known as:  BENADRYL  Take 1 capsule (25 mg total) by mouth every 6 (six) hours as needed for allergies (Takes as needed if hives return.).     estradiol 0.5 MG tablet  Commonly known as:  ESTRACE  Take 0.5 mg by mouth daily.     FLUoxetine 20 MG capsule  Commonly known as:  PROZAC  Take 20 mg by mouth daily.     lisinopril-hydrochlorothiazide 20-25 MG per tablet  Commonly known as:  PRINZIDE,ZESTORETIC  Take 1 tablet by mouth daily.     pantoprazole 40 MG tablet  Commonly known as:  PROTONIX  Take 40 mg by mouth 2 (two) times daily.     potassium chloride SA 20 MEQ tablet  Commonly known as:  K-DUR,KLOR-CON  Take 20 mEq by mouth 2 (two) times daily.     rOPINIRole 2 MG tablet  Commonly known as:  REQUIP  Take 2 mg by mouth at bedtime.     solifenacin 10 MG tablet  Commonly known as:  VESICARE  Take 10 mg by mouth daily.       Follow-up Information   Follow up with PCP. (As needed)      Greater than 30 minutes was spent completing the patient's discharge.   SignedTarri Fuller, PAC 10/09/2013, 3:35 PM

## 2013-10-09 NOTE — Progress Notes (Signed)
The patient reported having hives yesterday with some improvement, however, after the stress test they became worse with edema in the right upper lip.  She reported no difficulty breathing and lung exam is normal.  She was given 25mg  benadryl, pepcid 20mg  and 60mg  of solumedrol.   Ady Heimann, PAC

## 2013-10-09 NOTE — Discharge Instructions (Signed)

## 2013-10-09 NOTE — Progress Notes (Signed)
Patient c/o 6/10 midsternal/epigastric chest pressure, nonradiating at first. Maalox, Protonix and 3 sublingual nitroglycerins were given with no relief. EKG obtained with no changes from the previous. Vital signs remain stable. New orders for a GI cocktail with relief. Will continue to monitor. Lora Havens RN

## 2013-10-09 NOTE — Progress Notes (Signed)
Subjective: No further CP  Objective: Vital signs in last 24 hours: Temp:  [97.8 F (36.6 C)-98 F (36.7 C)] 98 F (36.7 C) (07/13 0530) Pulse Rate:  [59-96] 96 (07/13 0530) Resp:  [10-33] 16 (07/13 0530) BP: (117-151)/(43-75) 132/43 mmHg (07/13 0530) SpO2:  [95 %-99 %] 96 % (07/13 0530) Weight:  [246 lb 3 oz (111.67 kg)] 246 lb 3 oz (111.67 kg) (07/12 1225) Last BM Date: 10/08/13  Intake/Output from previous day:   Intake/Output this shift:    Medications Current Facility-Administered Medications  Medication Dose Route Frequency Provider Last Rate Last Dose  . 0.9 %  sodium chloride infusion  250 mL Intravenous PRN Dayna N Dunn, PA-C      . acetaminophen (TYLENOL) tablet 650 mg  650 mg Oral Q6H PRN Dayna N Dunn, PA-C      . alum & mag hydroxide-simeth (MAALOX/MYLANTA) 200-200-20 MG/5ML suspension 15 mL  15 mL Oral Q6H PRN Dayna N Dunn, PA-C   15 mL at 10/08/13 2037  . aspirin EC tablet 81 mg  81 mg Oral Daily Dayna N Dunn, PA-C      . darifenacin (ENABLEX) 24 hr tablet 15 mg  15 mg Oral Daily Dayna N Dunn, PA-C      . estradiol (ESTRACE) tablet 0.5 mg  0.5 mg Oral Daily Dayna N Dunn, PA-C      . FLUoxetine (PROZAC) capsule 20 mg  20 mg Oral Daily Dayna N Dunn, PA-C      . gi cocktail (Maalox,Lidocaine,Donnatal)  30 mL Oral BID PRN Sueanne Margarita, MD   30 mL at 10/09/13 0655  . heparin injection 5,000 Units  5,000 Units Subcutaneous 3 times per day Charlie Pitter, PA-C   5,000 Units at 10/09/13 0655  . lisinopril (PRINIVIL,ZESTRIL) tablet 20 mg  20 mg Oral Daily Josue Hector, MD       And  . hydrochlorothiazide (HYDRODIURIL) tablet 25 mg  25 mg Oral Daily Josue Hector, MD      . hydrocortisone cream 1 %   Topical TID Sueanne Margarita, MD   1 application at 37/16/96 0656  . loratadine (CLARITIN) tablet 10 mg  10 mg Oral Daily PRN Dayna N Dunn, PA-C      . nitroGLYCERIN (NITROSTAT) SL tablet 0.4 mg  0.4 mg Sublingual Q5 min PRN Malvin Johns, MD   0.4 mg at 10/08/13 2108  .  ondansetron (ZOFRAN) injection 4 mg  4 mg Intravenous Q6H PRN Dayna N Dunn, PA-C      . pantoprazole (PROTONIX) EC tablet 40 mg  40 mg Oral BID Dayna N Dunn, PA-C   40 mg at 10/08/13 2125  . potassium chloride SA (K-DUR,KLOR-CON) CR tablet 20 mEq  20 mEq Oral BID Dayna N Dunn, PA-C   20 mEq at 10/08/13 2125  . rOPINIRole (REQUIP) tablet 2 mg  2 mg Oral QHS PRN Dayna N Dunn, PA-C      . sodium chloride 0.9 % injection 3 mL  3 mL Intravenous Q12H Dayna N Dunn, PA-C      . sodium chloride 0.9 % injection 3 mL  3 mL Intravenous PRN Dayna N Dunn, PA-C        PE: General appearance: alert, cooperative and no distress Lungs: clear to auscultation bilaterally Heart: regular rate and rhythm, S1, S2 normal, no murmur, click, rub or gallop Extremities: No LEE Pulses: 2+ and symmetric Skin: Warm and dry Neurologic: Grossly normal  Lab Results:  Recent Labs  10/08/13 1233 10/08/13 1333 10/09/13 0023  WBC 9.7  --  10.0  HGB 13.7 15.3* 12.9  HCT 41.8 45.0 38.8  PLT 191  --  177   BMET  Recent Labs  10/08/13 1333 10/09/13 0023  NA 138 139  K 4.0 4.2  CL 102 100  CO2  --  23  GLUCOSE 84 134*  BUN 21 22  CREATININE 1.10 1.00  CALCIUM  --  9.0   PT/INR No results found for this basename: LABPROT, INR,  in the last 72 hours Cholesterol No results found for this basename: CHOL,  in the last 72 hours Cardiac Enzymes No components found with this basename: TROPONIN,  CKMB,   Studies/Results: @RISRSLT2 @   Assessment/Plan Obese 58 yo female seen in ER for indigestion/chest pain. Last saw [facility] May. Had normal stress test at [facility] a few years ago before right TKR. Saw [facility] for ? Bradycardia. Holter benign Had ETT in May with normal chronotropic response and no ischemia. 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 No immediate indigestion but at night had bad pain. Took antacid and relieved but then recurred two times with less antacid  relief and came to ER Also noted hives on legs and improved with Zyrtec. No dyspnea or palpitations. No history of GI issues GERD, Reflux or PUD. ? Of Factor V Leiden carrier and Protein S deficiency but not anticoagulated CRF HTN Compliant with ACE/diuretic. No history of GBD Despite R TKR she did well on ETT in May with exaggerated HR response due to deconditioning.   Active Problems:   Chest pain  Plan:  Ruled out for MI.  Treadmill myovue completed. She tolerated it well with no CP.  Interpretation to follow.  BP stable .  On protonix, asa, Lisinopril.   LOS: 1 day    HAGER, BRYAN PA-C 10/09/2013 9:06 AM  Agree with above. Awaiting results of myoview. Home later today if myoview okay.

## 2014-01-12 ENCOUNTER — Other Ambulatory Visit: Payer: Self-pay

## 2014-02-18 ENCOUNTER — Telehealth: Payer: Self-pay | Admitting: Nurse Practitioner

## 2014-02-18 NOTE — Telephone Encounter (Signed)
Pt called today to report that HR was 49 this AM with blood pressure of 124.  Records reviewed - HR 50 upon last clinic visit with known h/o bradycardia and normal chronotropic response on ETT.  She denies presyncope this morning but has noted mild chest pain similar to prior symptoms, which she says were later determined to be GERD.  She is on protonix BID.  I recommended that she may try an additional antacid but that if she has persistent chest pain, she should seek medical care today (ER) for ECG and further lab evaluation.  She verbalized understanding.

## 2014-07-16 ENCOUNTER — Ambulatory Visit: Payer: BC Managed Care – PPO | Admitting: Neurology

## 2014-08-13 ENCOUNTER — Telehealth: Payer: Self-pay | Admitting: *Deleted

## 2014-08-13 ENCOUNTER — Telehealth: Payer: Self-pay | Admitting: Cardiology

## 2014-08-13 NOTE — Telephone Encounter (Signed)
I have talked to pt.and discussed option plan

## 2014-08-13 NOTE — Telephone Encounter (Signed)
Pt. Called and told me that she had been having dizzy spells and her HR was dropping into the 40's , pt. Also stated she was not taking lisinopril /hctz anymore that Dr. Rockwell Germany had switched her to losartan/hctz 1/2 of 100/25 mg about 6 months ago. Pt. Also states shes been walking more and is in a lot better shape than she was when she started taking the new medication , I told pt. That she needs to start drinking one small gatorade every day but she needs to come in to see Dr. Percival Spanish and needs to make an appt. , pt. Agreed with plan

## 2014-08-13 NOTE — Telephone Encounter (Signed)
New problem    Pt's heart rate has been dropping in the 40's and feeling dizzy. patient want to speak to nurse.

## 2014-08-14 ENCOUNTER — Encounter: Payer: Self-pay | Admitting: *Deleted

## 2014-08-14 ENCOUNTER — Other Ambulatory Visit: Payer: Self-pay | Admitting: *Deleted

## 2014-08-14 ENCOUNTER — Ambulatory Visit (INDEPENDENT_AMBULATORY_CARE_PROVIDER_SITE_OTHER): Payer: BC Managed Care – PPO | Admitting: Cardiology

## 2014-08-14 ENCOUNTER — Encounter: Payer: Self-pay | Admitting: Cardiology

## 2014-08-14 VITALS — BP 128/76 | HR 53 | Ht 64.0 in | Wt 251.8 lb

## 2014-08-14 DIAGNOSIS — R001 Bradycardia, unspecified: Secondary | ICD-10-CM | POA: Diagnosis not present

## 2014-08-14 DIAGNOSIS — I10 Essential (primary) hypertension: Secondary | ICD-10-CM

## 2014-08-14 NOTE — Progress Notes (Signed)
HPI The patient presents for evaluation of bradycardia.  She has had this noted before. A Holter demonstrated only low heart rates when she was asleep. POET (Plain Old Exercise Treadmill) demonstrated normal heart rate response with her heart rate increasing up to 153. She has had sinus tachycardia when she was lifting something heavy.  She denies presyncope or syncope.  She does occasionally get dizziness. She's noticed her heart going down to the 40s. She describes some symptoms consistent more with orthostasis. However, again she's not had any passing out spells. It's not consistent that she gets this dizziness. She actually can walk a mile at a time now without symptoms.  Allergies  Allergen Reactions  . Codeine Nausea Only  . Diclofenac Sodium Other (See Comments)    Worsens RLS    Current Outpatient Prescriptions  Medication Sig Dispense Refill  . acetaminophen (TYLENOL) 325 MG tablet Take 650 mg by mouth every 6 (six) hours as needed for mild pain.    Marland Kitchen aspirin EC 81 MG tablet Take 81 mg by mouth daily.    . cetirizine (ZYRTEC) 10 MG tablet Take 10 mg by mouth as needed for allergies.    Marland Kitchen estradiol (ESTRACE) 0.5 MG tablet Take 0.5 mg by mouth daily.    Marland Kitchen FLUoxetine (PROZAC) 20 MG capsule Take 20 mg by mouth daily.    Marland Kitchen losartan-hydrochlorothiazide (HYZAAR) 50-12.5 MG per tablet Take 1 tablet by mouth daily.    . pantoprazole (PROTONIX) 40 MG tablet Take 40 mg by mouth 2 (two) times daily.     . potassium chloride SA (K-DUR,KLOR-CON) 20 MEQ tablet Take 20 mEq by mouth 2 (two) times daily.     . pregabalin (LYRICA) 75 MG capsule Take 75 mg by mouth 2 (two) times daily.    Marland Kitchen rOPINIRole (REQUIP) 2 MG tablet Take 2 mg by mouth at bedtime.    . solifenacin (VESICARE) 10 MG tablet Take 10 mg by mouth daily.     No current facility-administered medications for this visit.    Past Medical History  Diagnosis Date  . Depression   . Hypertension   . Allergic rhinitis   . Fibroids    dysfuction uterine bleeding  . GERD (gastroesophageal reflux disease)   . RLS (restless legs syndrome)   . Osteoarthritis   . Fatty liver   . Factor V Leiden carrier 08/26/2011  . Protein S deficiency 08/26/2011  . CKD (chronic kidney disease), stage III   . Peripheral neuropathy     Past Surgical History  Procedure Laterality Date  . Rectu    . Tubal ligation      bilateral  . Lumbar spine surgery    . Cholecystectomy    . Urethral sling    . Knee surgery      right   ROS: As stated in the HPI and negative for all other systems.  PHYSICAL EXAM BP 128/76 mmHg  Pulse 53  Ht 5\' 4"  (1.626 m)  Wt 251 lb 12.8 oz (114.216 kg)  BMI 43.20 kg/m2 GENERAL:  Well appearing LUNGS:  Clear to auscultation bilaterally BACK:  No CVA tenderness CHEST:  Unremarkable HEART:  PMI not displaced or sustained,S1 and S2 within normal limits, no S3, no S4, no clicks, no rubs, no murmurs ABD:  Flat, positive bowel sounds normal in frequency in pitch, no bruits, no rebound, no guarding, no midline pulsatile mass, no hepatomegaly, no splenomegaly EXT:  2 plus pulses throughout, trace, no cyanosis no clubbing SKIN:  No rashes no nodules   EKG:  Sinus rhythm, rate 57, axis within normal limits, intervals within normal limits, no acute ST-T wave changes  08/14/2014   ASSESSMENT AND PLAN  BRADYCARDIA -  The patient had normal heart rate response on POET (Plain Old Exercise Treadmill).  There were no symptomatic bradyarrhythmias.  No further evaluation is indicated.  We discussed that having a slow heart rate occasionally without chronotropic incompetence for frank syncope is not an indication for pacing. I will have her follow-up with her primary provider to make sure she's had a TSH.  HYPERTENSION -  The blood pressure is at target. No change in medications is indicated. We will continue with therapeutic lifestyle changes (TLC).   SEPTAL HYPERTROPHY - As above.  I will follow this clinically.

## 2014-08-14 NOTE — Patient Instructions (Signed)
Your physician recommends that you schedule a follow-up appointment in: one year with Dr. Hochrein  

## 2014-08-15 ENCOUNTER — Ambulatory Visit: Payer: BC Managed Care – PPO | Admitting: Neurology

## 2014-08-17 ENCOUNTER — Encounter: Payer: Self-pay | Admitting: Cardiology

## 2014-08-21 ENCOUNTER — Encounter: Payer: Self-pay | Admitting: Neurology

## 2014-09-09 ENCOUNTER — Telehealth: Payer: Self-pay | Admitting: Family Medicine

## 2014-09-09 NOTE — Telephone Encounter (Signed)
[name] [name] called to report a HR of 41.  She is currently watching television.  She denies syncope, does not some mild dizziness, but otherwise feels well.  Review of her chart shows normal chronotropic response to exercise in July of last year and a recent appointment for asymptomatic sinus bradycardia.  Counseled her that she is likely fine, and to only seek additional medical attention if she should feel severe light headedness.  Terressa Koyanagi, MD

## 2014-11-22 ENCOUNTER — Ambulatory Visit
Admission: RE | Admit: 2014-11-22 | Discharge: 2014-11-22 | Disposition: A | Discharge status: Home / Self Care | Payer: BC Managed Care – PPO | Source: Ambulatory Visit | Attending: Family Medicine | Admitting: Family Medicine

## 2014-11-22 ENCOUNTER — Other Ambulatory Visit: Payer: Self-pay | Admitting: Family Medicine

## 2014-11-22 DIAGNOSIS — R05 Cough: Secondary | ICD-10-CM

## 2014-11-22 DIAGNOSIS — R059 Cough, unspecified: Secondary | ICD-10-CM

## 2014-12-12 ENCOUNTER — Other Ambulatory Visit: Payer: Self-pay | Admitting: Obstetrics and Gynecology

## 2014-12-13 LAB — CYTOLOGY - PAP

## 2014-12-28 ENCOUNTER — Other Ambulatory Visit: Payer: Self-pay | Admitting: Family Medicine

## 2014-12-28 ENCOUNTER — Ambulatory Visit
Admission: RE | Admit: 2014-12-28 | Discharge: 2014-12-28 | Disposition: A | Discharge status: Home / Self Care | Payer: BC Managed Care – PPO | Source: Ambulatory Visit | Attending: Family Medicine | Admitting: Family Medicine

## 2014-12-28 DIAGNOSIS — R0781 Pleurodynia: Secondary | ICD-10-CM

## 2014-12-28 DIAGNOSIS — M545 Low back pain: Secondary | ICD-10-CM

## 2015-01-10 ENCOUNTER — Other Ambulatory Visit: Payer: Self-pay | Admitting: Family Medicine

## 2015-01-14 ENCOUNTER — Other Ambulatory Visit: Payer: Self-pay | Admitting: Family Medicine

## 2015-01-14 DIAGNOSIS — M5136 Other intervertebral disc degeneration, lumbar region: Secondary | ICD-10-CM

## 2015-01-15 ENCOUNTER — Other Ambulatory Visit: Payer: BC Managed Care – PPO

## 2015-01-24 ENCOUNTER — Ambulatory Visit
Admission: RE | Admit: 2015-01-24 | Discharge: 2015-01-24 | Disposition: A | Discharge status: Home / Self Care | Payer: BC Managed Care – PPO | Source: Ambulatory Visit | Attending: Family Medicine | Admitting: Family Medicine

## 2015-01-24 DIAGNOSIS — M5136 Other intervertebral disc degeneration, lumbar region: Secondary | ICD-10-CM

## 2015-01-24 MED ORDER — GADOBENATE DIMEGLUMINE 529 MG/ML IV SOLN: 20.0000 mL | Freq: Once | INTRAVENOUS | Status: DC | PRN

## 2015-06-13 ENCOUNTER — Encounter: Payer: Self-pay | Admitting: Neurology

## 2015-06-13 ENCOUNTER — Ambulatory Visit (INDEPENDENT_AMBULATORY_CARE_PROVIDER_SITE_OTHER): Payer: BC Managed Care – PPO | Admitting: Neurology

## 2015-06-13 VITALS — BP 117/63 | HR 74 | Ht 64.0 in | Wt 261.0 lb

## 2015-06-13 DIAGNOSIS — G2581 Restless legs syndrome: Secondary | ICD-10-CM | POA: Diagnosis not present

## 2015-06-13 DIAGNOSIS — R202 Paresthesia of skin: Secondary | ICD-10-CM | POA: Diagnosis not present

## 2015-06-13 MED ORDER — DULOXETINE HCL 30 MG PO CPEP: 30.0000 mg | ORAL_CAPSULE | Freq: Every day | ORAL | Status: DC

## 2015-06-13 NOTE — Patient Instructions (Signed)
Paresthesia Paresthesia is an abnormal burning or prickling sensation. This sensation is generally felt in the hands, arms, legs, or feet. However, it may occur in any part of the body. Usually, it is not painful. The feeling may be described as:  Tingling or numbness.  Pins and needles.  Skin crawling.  Buzzing.  Limbs falling asleep.  Itching. Most people experience temporary (transient) paresthesia at some time in their lives. Paresthesia may occur when you breathe too quickly (hyperventilation). It can also occur without any apparent cause. Commonly, paresthesia occurs when pressure is placed on a nerve. The sensation quickly goes away after the pressure is removed. For some people, however, paresthesia is a long-lasting (chronic) condition that is caused by an underlying disorder. If you continue to have paresthesia, you may need further medical evaluation. HOME CARE INSTRUCTIONS Watch your condition for any changes. Taking the following actions may help to lessen any discomfort that you are feeling:  Avoid drinking alcohol.  Try acupuncture or massage to help relieve your symptoms.  Keep all follow-up visits as directed by your health care provider. This is important. SEEK MEDICAL CARE IF:  You continue to have episodes of paresthesia.  Your burning or prickling feeling gets worse when you walk.  You have pain, cramps, or dizziness.  You develop a rash. SEEK IMMEDIATE MEDICAL CARE IF:  You feel weak.  You have trouble walking or moving.  You have problems with speech, understanding, or vision.  You feel confused.  You cannot control your bladder or bowel movements.  You have numbness after an injury.  You faint.   This information is not intended to replace advice given to you by your health care provider. Make sure you discuss any questions you have with your health care provider.   Document Released: 03/06/2002 Document Revised: 07/31/2014 Document Reviewed:  03/12/2014 Elsevier Interactive Patient Education 2016 [employer]  

## 2015-06-13 NOTE — Progress Notes (Signed)
Reason for visit: Foot pain  Referring physician: Dr. Devonne Doughty [name] [name] is a 60 y.o. female  History of present illness:  [name] [name] is a 60 year old right-handed white female with a history of bilateral foot pain that has been present for about one year. The patient mainly notes pain when she is off of her feet, not when she is walking. The patient describes burning and stabbing pain in the ball of the foot and in the arch of the foot. The patient indicates that the discomfort may keep her awake at night. She was placed on Lyrica will with good benefit, but her insurance would not cover the medication. She has been switched to gabapentin which offers some relief but is not as effective as Lyrica had been. The patient denies any significant balance issues, she denies any falls. She does have some occasional low back pain, some right hip pain, at times she may feel that the right leg will "give away". The patient has some urinary incontinence and some difficulty with diarrhea at times, she has been told that she has irritable bowel syndrome. She denies any numbness of the hands, she does have some numbness at times of the left great toe. She indicates that her mother also had a neuropathy problem. She is sent to this office for an evaluation. Previous blood work that includes a thyroid profile, and a vitamin B12 level were unremarkable.  Past Medical History  Diagnosis Date  . Depression   . Hypertension   . Allergic rhinitis   . Fibroids     dysfuction uterine bleeding  . GERD (gastroesophageal reflux disease)   . RLS (restless legs syndrome)   . Osteoarthritis   . Fatty liver   . Factor V Leiden carrier ([place]) 08/26/2011  . Protein S deficiency ([place]) 08/26/2011  . CKD (chronic kidney disease), stage III   . Peripheral neuropathy ([place])   . Restless leg syndrome   . Arthritis     Past Surgical History  Procedure Laterality Date  . Rectu    . Tubal ligation      bilateral    . Lumbar spine surgery    . Cholecystectomy    . Urethral sling    . Knee surgery      right TKR    Family History  Problem Relation Age of Onset  . Coronary artery disease Father 74    deceased- TIAs  . Stroke Father   . Hypertension Mother 79    alive  . Deep vein thrombosis Mother   . Neuropathy Mother   . Osteoporosis Mother   . Liver disease Sister   . Other Sister     ITP    Social history:  reports that she has never smoked. She has never used smokeless tobacco. She reports that she does not drink alcohol or use illicit drugs.  Medications:  Prior to Admission medications   Medication Sig Start Date End Date Taking? Authorizing Provider  acetaminophen (TYLENOL) 325 MG tablet Take 650 mg by mouth every 6 (six) hours as needed for mild pain.   Yes Historical Provider, MD  aspirin EC 81 MG tablet Take 81 mg by mouth daily.   Yes Historical Provider, MD  cetirizine (ZYRTEC) 10 MG tablet Take 10 mg by mouth as needed for allergies.   Yes Historical Provider, MD  estradiol (ESTRACE) 0.5 MG tablet Take 0.5 mg by mouth daily.   Yes Historical Provider, MD  FLUoxetine (PROZAC) 20 MG  capsule Take 20 mg by mouth daily.   Yes Historical Provider, MD  furosemide (LASIX) 40 MG tablet 40 mg daily as needed.  05/27/15  Yes Historical Provider, MD  gabapentin (NEURONTIN) 600 MG tablet 600 mg. Taking 1 tablet AM and 2 ablets PM 06/11/15  Yes Historical Provider, MD  losartan-hydrochlorothiazide (HYZAAR) 50-12.5 MG per tablet Take 1 tablet by mouth daily.   Yes Historical Provider, MD  Multiple Vitamin (THERA) TABS Take by mouth.   Yes Historical Provider, MD  Omega-3 Fatty Acids (FISH OIL) 1000 MG CAPS Take by mouth.   Yes Historical Provider, MD  pantoprazole (PROTONIX) 40 MG tablet Take 40 mg by mouth 2 (two) times daily.    Yes Historical Provider, MD  potassium chloride SA (K-DUR,KLOR-CON) 20 MEQ tablet Take 20 mEq by mouth 2 (two) times daily.    Yes Historical Provider, MD   rOPINIRole (REQUIP) 2 MG tablet Take 2 mg by mouth at bedtime.   Yes Historical Provider, MD  solifenacin (VESICARE) 10 MG tablet Take 10 mg by mouth daily.   Yes Historical Provider, MD      Allergies  Allergen Reactions  . Codeine Nausea Only  . Diclofenac Sodium Other (See Comments)    Worsens RLS    ROS:  Out of a complete 14 system review of symptoms, the patient complains only of the following symptoms, and all other reviewed systems are negative.  Weight gain Ringing in the ears Moles Urination problems Joint pain, achy muscles Allergies, skin sensitivity Numbness in the toes Snoring, restless legs  Blood pressure 117/63, pulse 74, height 5\' 4"  (1.626 m), weight 261 lb (118.389 kg).  Physical Exam  General: The patient is alert and cooperative at the time of the examination. The patient is markedly obese.  Eyes: Pupils are equal, round, and reactive to light. Discs are flat bilaterally.  Neck: The neck is supple, no carotid bruits are noted.  Respiratory: The respiratory examination is clear.  Cardiovascular: The cardiovascular examination reveals a regular rate and rhythm, no obvious murmurs or rubs are noted.  Skin: Extremities are without significant edema.  Neurologic Exam  Mental status: The patient is alert and oriented x 3 at the time of the examination. The patient has apparent normal recent and remote memory, with an apparently normal attention span and concentration ability.  Cranial nerves: Facial symmetry is present. There is good sensation of the face to pinprick and soft touch bilaterally. The strength of the facial muscles and the muscles to head turning and shoulder shrug are normal bilaterally. Speech is well enunciated, no aphasia or dysarthria is noted. Extraocular movements are full. Visual fields are full. The tongue is midline, and the patient has symmetric elevation of the soft palate. No obvious hearing deficits are noted.  Motor: The  motor testing reveals 5 over 5 strength of all 4 extremities. Good symmetric motor tone is noted throughout.  Sensory: Sensory testing is intact to pinprick, soft touch, vibration sensation, and position sense on all 4 extremities, with exception of some stocking pattern pinprick sensory deficit up to the knees bilaterally. No evidence of extinction is noted.  Coordination: Cerebellar testing reveals good finger-nose-finger and heel-to-shin bilaterally.  Gait and station: Gait is normal. Tandem gait is normal. Romberg is negative. No drift is seen. The patient is not able to walk on heel on the right foot, can perform on the left. The patient is able to walk on the toes bilaterally.  Reflexes: Deep tendon reflexes are symmetric  in the upper extremities. With the lower extremity, the knee jerk reflexes are depressed bilaterally, the left ankle jerk reflex is absent, present on the right. Toes are downgoing bilaterally.   Assessment/Plan:  1. Bilateral foot pain, rule out peripheral neuropathy  2. Obesity  The patient reports symptoms of burning pain in the feet that is worse at night, and seems to be less with weightbearing. The patient could have a very early peripheral neuropathy. She will be sent for blood work today, nerve conduction studies will be done on both legs, EMG on the right leg. She will follow-up for the above study. She will be placed on low-dose Cymbalta, 30 mg daily. This dose can be altered in the future if needed. In the past, she has gotten good benefit with Lyrica but could not get insurance coverage for the medication.  Jill Alexanders MD 06/13/2015 1:07 PM  [facility] [address] [place], [place] [address]  Phone [phone] Fax [phone]

## 2015-06-18 LAB — MULTIPLE MYELOMA PANEL, SERUM
ALBUMIN SERPL ELPH-MCNC: 3.3 g/dL (ref 2.9–4.4)
ALPHA 1: 0.3 g/dL (ref 0.0–0.4)
Albumin/Glob SerPl: 1.1 (ref 0.7–1.7)
Alpha2 Glob SerPl Elph-Mcnc: 0.9 g/dL (ref 0.4–1.0)
B-GLOBULIN SERPL ELPH-MCNC: 1.3 g/dL (ref 0.7–1.3)
Gamma Glob SerPl Elph-Mcnc: 0.7 g/dL (ref 0.4–1.8)
Globulin, Total: 3.1 g/dL (ref 2.2–3.9)
IGG (IMMUNOGLOBIN G), SERUM: 607 mg/dL — AB (ref 700–1600)
IgA/Immunoglobulin A, Serum: 250 mg/dL (ref 87–352)
IgM (Immunoglobulin M), Srm: 54 mg/dL (ref 26–217)
TOTAL PROTEIN: 6.4 g/dL (ref 6.0–8.5)

## 2015-06-18 LAB — RHEUMATOID FACTOR: Rhuematoid fact SerPl-aCnc: <10 IU/mL (ref 0.0–13.9)

## 2015-06-18 LAB — B. BURGDORFI ANTIBODIES: Lyme IgG/IgM Ab: <0.91 {ISR} (ref 0.00–0.90)

## 2015-06-18 LAB — ANGIOTENSIN CONVERTING ENZYME: Angio Convert Enzyme: 25 U/L (ref 14–82)

## 2015-06-18 LAB — ANA W/REFLEX: Anti Nuclear Antibody(ANA): NEGATIVE

## 2015-07-08 ENCOUNTER — Encounter: Payer: BC Managed Care – PPO | Admitting: Neurology

## 2015-07-31 ENCOUNTER — Ambulatory Visit (INDEPENDENT_AMBULATORY_CARE_PROVIDER_SITE_OTHER): Payer: Self-pay | Admitting: Neurology

## 2015-07-31 ENCOUNTER — Ambulatory Visit (INDEPENDENT_AMBULATORY_CARE_PROVIDER_SITE_OTHER): Payer: BC Managed Care – PPO | Admitting: Neurology

## 2015-07-31 ENCOUNTER — Encounter: Payer: Self-pay | Admitting: Neurology

## 2015-07-31 DIAGNOSIS — R202 Paresthesia of skin: Secondary | ICD-10-CM

## 2015-07-31 DIAGNOSIS — G2581 Restless legs syndrome: Secondary | ICD-10-CM

## 2015-07-31 MED ORDER — DULOXETINE HCL 30 MG PO CPEP: 30.0000 mg | ORAL_CAPSULE | Freq: Two times a day (BID) | ORAL | Status: DC

## 2015-07-31 NOTE — Progress Notes (Signed)
[name] [name] comes in today for EMG and nerve conduction study evaluation. Nerve conduction study shows generalized lowering of motor amplitudes of the posterior tibial and peroneal nerves in the legs, sensory sparing is seen. EMG of the right leg shows a low-grade acute S1 radiculopathy. The patient will go up on the Cymbalta taking 30 mg twice daily. She will remain on gabapentin. The patient indicates that epidural steroid injections in the past has not been beneficial. She does have some back pain and sciatica pain down the right leg, now having similar problems on the left. She will follow-up in 4 months.

## 2015-07-31 NOTE — Procedures (Signed)
     HISTORY:  [name] [name] is a 60 year old patient with a history of paresthesias in the feet bilaterally, worse at night when she is trying to sleep. She does report some back pain and pain down the right greater than left leg. She is being evaluated for possible neuropathy or a lumbosacral radiculopathy.  NERVE CONDUCTION STUDIES:  Nerve conduction studies were performed on both lower extremities. The distal motor latencies for the peroneal nerves and posterior tibial nerves were normal bilaterally, but there is lowering of motor amplitudes for these nerves bilaterally. Nerve conduction velocities for the peroneal and posterior tibial nerves are normal bilaterally with a normal peroneal sensory latency bilaterally. The H reflex latencies are symmetric and normal.  EMG STUDIES:  EMG study was performed on the right lower extremity:  The tibialis anterior muscle reveals 2 to 4K motor units with slightly decreased recruitment. No fibrillations or positive waves were seen. The peroneus tertius muscle reveals 2 to 4K motor units with full recruitment. No fibrillations or positive waves were seen. The medial gastrocnemius muscle reveals 1 to 3K motor units with full recruitment. 1+ positive waves were seen. The vastus lateralis muscle reveals 2 to 4K motor units with full recruitment. No fibrillations or positive waves were seen. The iliopsoas muscle reveals 2 to 4K motor units with full recruitment. No fibrillations or positive waves were seen. The biceps femoris muscle (long head) reveals 2 to 4K motor units with full recruitment. No fibrillations or positive waves were seen. The lumbosacral paraspinal muscles were tested at 3 levels, and revealed 1+ positive waves at all 3 levels tested. There was good relaxation.   IMPRESSION:  Nerve conduction studies done on both lower extremities shows evidence of a primarily motor involvement of the nerves in both legs with sensory sparing. No  clear evidence of a peripheral neuropathy is seen. An early peripheral neuropathy may be missed by a standard nerve conduction studies, however. Clinical correlation is required. EMG evaluation of the right lower extremity shows findings consistent with a mild acute S1 radiculopathy.  Jill Alexanders MD 07/31/2015 1:55 PM  [facility] [address] [place], [place] [address]  Phone [phone] Fax [phone]

## 2015-07-31 NOTE — Progress Notes (Signed)
Please refer to EMG and nerve conduction study procedure note. 

## 2015-09-12 ENCOUNTER — Telehealth: Payer: Self-pay | Admitting: Neurology

## 2015-09-12 NOTE — Telephone Encounter (Signed)
Patient called to advise, was seen by PCP today and was concerned that she is taking both DULoxetine (CYMBALTA) 30 MG capsule and FLUoxetine (PROZAC) 20 MG capsule. Please call to advise.

## 2015-09-13 NOTE — Telephone Encounter (Signed)
Returned pt TC. Reports that she has been on Prozac, prescribed by PCP, for 5-6 years. Was started on Cymbalta by Dr. Jannifer Franklin in March and dose was increased in May. She is now taking 30 mg BID and feels that this has helped a lot w/ her back and fibromyalgia pain. She talked w/ her PCP yesterday and it was decided to stop the Prozac at this time. Says that she is still having burning pain in her feet and plans on scheduling f/u appt in a few months.

## 2015-09-15 NOTE — Addendum Note (Signed)
Addended by: Margette Fast on: 09/15/2015 05:46 PM   Modules accepted: Orders, Medications

## 2015-10-14 ENCOUNTER — Other Ambulatory Visit: Payer: Self-pay | Admitting: Family Medicine

## 2015-10-20 NOTE — Progress Notes (Signed)
HPI The patient presents for evaluation of bradycardia.  She has had this noted before. A Holter demonstrated only low heart rates when she was asleep. POET (Plain Old Exercise Treadmill) demonstrated normal heart rate response with her heart rate increasing up to 153. She did have a negative stress perfusion study in 2015. She had some basal hypertrophy on echocardiogram. She returns for follow up.  She reports she has back pain about 3 months ago. This is a sharp discomfort. This was limiting but was isolated. She has otherwise had some rare chest pressure. This happens maybe once a week. It is sporadic. It is similar to previous symptoms she had with her negative stress test. She cannot bring this on with activities and she can do yard work such as trimming and other significant activities. She says this is mild mucosal weight spontaneously. She does not have associated symptoms such as nausea vomiting or diaphoresis. She was having shortness of breath, PND or orthopnea. She's not having any tachycardia symptoms or bradycardia symptoms that she was having previously.  Allergies  Allergen Reactions  . Codeine Nausea Only  . Diclofenac Sodium Other (See Comments)    Worsens RLS    Current Outpatient Prescriptions  Medication Sig Dispense Refill  . acetaminophen (TYLENOL) 325 MG tablet Take 650 mg by mouth every 6 (six) hours as needed for mild pain.    Marland Kitchen aspirin EC 81 MG tablet Take 81 mg by mouth daily.    . cetirizine (ZYRTEC) 10 MG tablet Take 10 mg by mouth as needed for allergies.    . DULoxetine (CYMBALTA) 30 MG capsule Pt take 1 tablets by mouth in the morning and 2 tablets in the evening    . estradiol (ESTRACE) 0.5 MG tablet Take 0.5 mg by mouth daily.    . furosemide (LASIX) 40 MG tablet 40 mg daily as needed.     . gabapentin (NEURONTIN) 600 MG tablet 600 mg. Taking 1 tablet AM and 2 tablets PM    . losartan-hydrochlorothiazide (HYZAAR) 50-12.5 MG per tablet Take 1 tablet by mouth  daily.    . Multiple Vitamin (THERA) TABS Take by mouth.    . Omega-3 Fatty Acids (FISH OIL) 1000 MG CAPS Take by mouth.    . pantoprazole (PROTONIX) 40 MG tablet Take 40 mg by mouth 2 (two) times daily.     . potassium chloride SA (K-DUR,KLOR-CON) 20 MEQ tablet Take 20 mEq by mouth 2 (two) times daily.     Marland Kitchen rOPINIRole (REQUIP) 2 MG tablet Take 2 mg by mouth at bedtime.    . solifenacin (VESICARE) 10 MG tablet Take 10 mg by mouth daily.     No current facility-administered medications for this visit.     Past Medical History:  Diagnosis Date  . Allergic rhinitis   . Arthritis   . CKD (chronic kidney disease), stage III   . Depression   . Factor V Leiden carrier ([place]) 08/26/2011  . Fatty liver   . Fibroids    dysfuction uterine bleeding  . GERD (gastroesophageal reflux disease)   . Hypertension   . Osteoarthritis   . Peripheral neuropathy ([place])   . Protein S deficiency ([place]) 08/26/2011  . Restless leg syndrome   . RLS (restless legs syndrome)     Past Surgical History:  Procedure Laterality Date  . CHOLECYSTECTOMY    . KNEE SURGERY     right TKR  . LUMBAR SPINE SURGERY    . rectu    .  TUBAL LIGATION     bilateral  . URETHRAL SLING     ROS: As stated in the HPI and negative for all other systems.  PHYSICAL EXAM BP (!) 160/67 (BP Location: Right Arm, Patient Position: Sitting, Cuff Size: Large)   Pulse (!) 48   Ht 5' 4.5" (1.638 m)   Wt 252 lb 9.6 oz (114.6 kg)   SpO2 95%   BMI 42.69 kg/m  GENERAL:  Well appearing LUNGS:  Clear to auscultation bilaterally BACK:  No CVA tenderness CHEST:  Unremarkable HEART:  PMI not displaced or sustained,S1 and S2 within normal limits, no S3, no S4, no clicks, no rubs, no murmurs ABD:  Flat, positive bowel sounds normal in frequency in pitch, no bruits, no rebound, no guarding, no midline pulsatile mass, no hepatomegaly, no splenomegaly EXT:  2 plus pulses throughout, trace, no cyanosis no clubbing SKIN:  No rashes no  nodules   EKG:  Sinus rhythm, rate 48, LAD, LAFB, ntervals within normal limits, no acute ST-T wave changes  10/21/2015   ASSESSMENT AND PLAN  BRADYCARDIA -  The patient had normal heart rate response on POET (Plain Old Exercise Treadmill).  She has had no new symptoms. No further testing is indicated.  HYPERTENSION -  The blood pressure is elevated today. Marland Kitchen   She will keep a blood pressure diary. I reviewed old readings and this was particularly unusual.  SEPTAL HYPERTROPHY - It has been 4 years since her last echocardiogram and I will follow up with one of these.  CHEST PAIN - Her discomfort is atypical. It happens very sporadically and she can do physical activity without bringing this on. She had a negative treadmill test and perfusion study in 2015. I don't think further imaging is indicated at this point unless she has an increased pattern of discomfort.

## 2015-10-21 ENCOUNTER — Encounter: Payer: Self-pay | Admitting: Cardiology

## 2015-10-21 ENCOUNTER — Ambulatory Visit (INDEPENDENT_AMBULATORY_CARE_PROVIDER_SITE_OTHER): Payer: BC Managed Care – PPO | Admitting: Cardiology

## 2015-10-21 VITALS — BP 160/67 | HR 48 | Ht 64.5 in | Wt 252.6 lb

## 2015-10-21 DIAGNOSIS — I517 Cardiomegaly: Secondary | ICD-10-CM | POA: Insufficient documentation

## 2015-10-21 DIAGNOSIS — R072 Precordial pain: Secondary | ICD-10-CM | POA: Diagnosis not present

## 2015-10-21 DIAGNOSIS — R001 Bradycardia, unspecified: Secondary | ICD-10-CM

## 2015-10-21 NOTE — Patient Instructions (Signed)
Medication Instructions:  Continue current medications  Labwork: NONE  Testing/Procedures: Your physician has requested that you have an echocardiogram. Echocardiography is a painless test that uses sound waves to create images of your heart. It provides your doctor with information about the size and shape of your heart and how well your heart's chambers and valves are working. This procedure takes approximately one hour. There are no restrictions for this procedure.   Follow-Up: Your physician wants you to follow-up in: 18 Months. You will receive a reminder letter in the mail two months in advance. If you don't receive a letter, please call our office to schedule the follow-up appointment.   Any Other Special Instructions Will Be Listed Below (If Applicable).   If you need a refill on your cardiac medications before your next appointment, please call your pharmacy.

## 2015-11-04 ENCOUNTER — Ambulatory Visit (HOSPITAL_COMMUNITY): Payer: BC Managed Care – PPO | Attending: Cardiovascular Disease

## 2015-11-04 ENCOUNTER — Other Ambulatory Visit: Payer: Self-pay

## 2015-11-04 DIAGNOSIS — I517 Cardiomegaly: Secondary | ICD-10-CM

## 2015-11-04 DIAGNOSIS — N189 Chronic kidney disease, unspecified: Secondary | ICD-10-CM | POA: Insufficient documentation

## 2015-11-04 DIAGNOSIS — I071 Rheumatic tricuspid insufficiency: Secondary | ICD-10-CM | POA: Diagnosis not present

## 2015-11-04 DIAGNOSIS — I1310 Hypertensive heart and chronic kidney disease without heart failure, with stage 1 through stage 4 chronic kidney disease, or unspecified chronic kidney disease: Secondary | ICD-10-CM | POA: Diagnosis not present

## 2015-12-04 ENCOUNTER — Other Ambulatory Visit: Payer: Self-pay | Admitting: Neurology

## 2016-01-06 ENCOUNTER — Other Ambulatory Visit: Payer: Self-pay | Admitting: Neurology

## 2016-01-12 ENCOUNTER — Other Ambulatory Visit: Payer: Self-pay | Admitting: Neurology

## 2016-02-11 ENCOUNTER — Other Ambulatory Visit: Payer: Self-pay | Admitting: Neurology

## 2016-03-14 ENCOUNTER — Encounter: Payer: Self-pay | Admitting: Neurology

## 2016-05-25 ENCOUNTER — Other Ambulatory Visit: Payer: Self-pay | Admitting: Neurology

## 2016-05-31 ENCOUNTER — Encounter: Payer: Self-pay | Admitting: Neurology

## 2016-05-31 ENCOUNTER — Other Ambulatory Visit: Payer: Self-pay | Admitting: Neurology

## 2016-06-01 ENCOUNTER — Other Ambulatory Visit: Payer: Self-pay | Admitting: Neurology

## 2016-06-01 MED ORDER — DULOXETINE HCL 30 MG PO CPEP: 30.0000 mg | ORAL_CAPSULE | Freq: Two times a day (BID) | ORAL | 1 refills | Status: DC

## 2016-06-02 ENCOUNTER — Encounter: Payer: Self-pay | Admitting: *Deleted

## 2016-10-28 ENCOUNTER — Ambulatory Visit (INDEPENDENT_AMBULATORY_CARE_PROVIDER_SITE_OTHER): Payer: BC Managed Care – PPO | Admitting: Podiatry

## 2016-10-28 ENCOUNTER — Encounter: Payer: Self-pay | Admitting: Podiatry

## 2016-10-28 VITALS — BP 183/90 | HR 47

## 2016-10-28 DIAGNOSIS — Q828 Other specified congenital malformations of skin: Secondary | ICD-10-CM | POA: Diagnosis not present

## 2016-10-28 NOTE — Progress Notes (Signed)
   Subjective:    Patient ID: [name] [name], female    DOB: 06/27/1955, 61 y.o.   MRN: [id number]  HPI this patient presents the office with chief complaint of painful calluses on both feet.  She says her feet are painful walking and wearing her shoes.  She says she caught her nails 3 days ago at home.  Patient has previously been diagnosed with a lesions on both feet for which she is taking gabapentin.  She presents the office today for an evaluation and treatment of her feet    Review of Systems  HENT: Positive for ear pain and sinus pain.   Eyes: Positive for visual disturbance.       Objective:   Physical Exam GENERAL APPEARANCE: Alert, conversant. Appropriately groomed. No acute distress.  VASCULAR: Pedal pulses are  palpable at  [facility] and PT bilateral.  Capillary refill time is immediate to all digits,  Normal temperature gradient.  Digital hair growth is present bilateral  NEUROLOGIC: sensation is normal to 5.07 monofilament at 5/5 sites bilateral.  Light touch is intact bilateral, Muscle strength normal.  MUSCULOSKELETAL: acceptable muscle strength, tone and stability bilateral.  Intrinsic muscluature intact bilateral.  Rectus appearance of foot and digits noted bilateral.  Dorsal  DJD left midfoot.  DERMATOLOGIC: skin color, texture, and turgor are within normal limits.  No preulcerative lesions or ulcers  are seen, no interdigital maceration noted.  No open lesions present.  Digital nails are asymptomatic. No drainage noted. Porokeratosis sub hallux left and sub 4th right.         Assessment & Plan:  Porokeratosis  B/L  IE  Debride porokeratosis  B/L  RTC 3 months for preventative foot care services.   Gardiner Barefoot DPM

## 2016-11-09 ENCOUNTER — Telehealth: Payer: Self-pay | Admitting: Neurology

## 2016-11-09 NOTE — Telephone Encounter (Signed)
Patient called having very painful feet. I scheduled her with Dr. Jannifer Franklin per Terrence Dupont on 11-11-16 @ 7:30am to arrive at 7am.

## 2016-11-09 NOTE — Telephone Encounter (Signed)
Dr Jannifer Franklin- [facility]. You have not seen patient since 07/31/15.

## 2016-11-11 ENCOUNTER — Ambulatory Visit (INDEPENDENT_AMBULATORY_CARE_PROVIDER_SITE_OTHER): Payer: BC Managed Care – PPO | Admitting: Neurology

## 2016-11-11 ENCOUNTER — Encounter: Payer: Self-pay | Admitting: Neurology

## 2016-11-11 VITALS — BP 172/81 | HR 52 | Ht 64.0 in | Wt 253.0 lb

## 2016-11-11 DIAGNOSIS — G2581 Restless legs syndrome: Secondary | ICD-10-CM | POA: Diagnosis not present

## 2016-11-11 DIAGNOSIS — M797 Fibromyalgia: Secondary | ICD-10-CM | POA: Insufficient documentation

## 2016-11-11 DIAGNOSIS — R202 Paresthesia of skin: Secondary | ICD-10-CM

## 2016-11-11 MED ORDER — DULOXETINE HCL 60 MG PO CPEP: 60.0000 mg | ORAL_CAPSULE | Freq: Two times a day (BID) | ORAL | 3 refills | Status: DC

## 2016-11-11 NOTE — Patient Instructions (Signed)
   We will go up on the cymbalta 30 mg to one in the morning and 2 in the evening until you use up the bottle, then convert to the 60 mg tablet taking one twice a day.

## 2016-11-11 NOTE — Progress Notes (Signed)
Reason for visit: Foot pain  [name] [name] is an 61 y.o. female  History of present illness:  [name] [name] is a 61 year old right-handed white female with a history of obesity, chronic low back pain with degenerative arthritis involving the back, and a history of numbness and tingling and foot discomfort consistent with a peripheral neuropathy. The patient also has discomfort in the neck and shoulders that may occur, the possibility of fibromyalgia has been entertained. The patient has gained benefit in the past with Lyrica, but her insurance would not cover the medication. She is on gabapentin taking 600 mg the morning and 1200 mg in the evening without complete benefit. She is having swelling in the legs, she uses compression stockings. 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 The patient denies any significant balance issues, she has initially gained benefit with Cymbalta, she currently is on 30 mg twice daily. She is tolerating the medication well. She has restless leg syndrome and her Requip appears to be helpful. Her feet do bother her more when she is off of her feet, better when she is walking. She oftentimes does not sleep well because of the foot discomfort. She returns to this office for further evaluation.   Past Medical History:  Diagnosis Date  . Allergic rhinitis   . Arthritis   . CKD (chronic kidney disease), stage III   . Depression   . Factor V Leiden carrier ([name] [name]) 08/26/2011  . Fatty liver   . Fibroids    dysfuction uterine bleeding  . GERD (gastroesophageal reflux disease)   . Hypertension   . Osteoarthritis   . Peripheral neuropathy   . Protein S deficiency ([name] [name]) 08/26/2011  . Restless leg syndrome   . RLS (restless legs syndrome)     Past Surgical History:  Procedure Laterality Date  . CHOLECYSTECTOMY    . KNEE SURGERY     right  TKR  . LUMBAR SPINE SURGERY    . rectu    . TUBAL LIGATION     bilateral  . URETHRAL SLING      Family History  Problem Relation Age of Onset  . Coronary artery disease Father 4       deceased- TIAs  . Stroke Father   . Hypertension Mother 85       alive  . Deep vein thrombosis Mother   . Neuropathy Mother   . Osteoporosis Mother   . Liver disease Sister   . Other Sister        ITP    Social history:  reports that she has never smoked. She has never used smokeless tobacco. She reports that she does not drink alcohol or use drugs.    Allergies  Allergen Reactions  . Codeine Nausea Only  . Diclofenac Sodium Other (See Comments)    Worsens RLS    Medications:  Prior to Admission medications   Medication Sig Start Date End Date Taking? Authorizing Provider  acetaminophen (TYLENOL) 325 MG tablet Take 650 mg by mouth every 6 (six) hours as needed for mild pain.   Yes [provider]  aspirin EC 81 MG tablet Take 81 mg by mouth daily.   Yes [provider]  cetirizine (ZYRTEC) 10 MG tablet Take 10 mg by mouth as needed for allergies.   Yes [provider]  DULoxetine (CYMBALTA) 30  MG capsule Take 1 capsule (30 mg total) by mouth 2 (two) times daily. 06/01/16  Yes Kathrynn Ducking, MD  estradiol (ESTRACE) 0.5 MG tablet Take 0.5 mg by mouth daily.   Yes [provider]  furosemide (LASIX) 40 MG tablet 40 mg daily as needed.  05/27/15  Yes [provider]  gabapentin (NEURONTIN) 600 MG tablet 600 mg. Taking 1 tablet AM and 2 tablets PM 06/11/15  Yes [provider]  losartan-hydrochlorothiazide (HYZAAR) 50-12.5 MG per tablet Take 1 tablet by mouth daily.   Yes [provider]  Mirabegron (MYRBETRIQ PO) Take 25 mg by mouth daily.   Yes [provider]  Multiple Vitamin (THERA) TABS Take by mouth.   Yes [provider]  Omega-3 Fatty Acids (FISH OIL) 1000 MG CAPS Take by mouth.   Yes [provider]  pantoprazole (PROTONIX) 40 MG tablet Take 40 mg by mouth 2 (two) times daily.    Yes [provider]  potassium chloride SA (K-DUR,KLOR-CON) 20 MEQ tablet Take 20 mEq by mouth 2 (two) times daily.    Yes [provider]  rOPINIRole (REQUIP) 2 MG tablet Take 2 mg by mouth at bedtime.   Yes [provider]    ROS:  Out of a complete 14 system review of symptoms, the patient complains only of the following symptoms, and all other reviewed systems are negative.  Excessive sweating Ringing in the ears, difficulty swallowing Eye itching Restless legs Environmental allergies Incontinence of the bladder Joint pain, back pain, muscle cramps, walking difficulty  Blood pressure (!) 172/81, pulse (!) 52, height 5\' 4"  (1.626 m), weight 253 lb (114.8 kg).  Physical Exam  General: The patient is alert and cooperative at the time of the examination. The patient is markedly obese.  Skin: No significant peripheral edema is noted.   Neurologic Exam  Mental status: The patient is alert and oriented x 3 at the time of the examination. The patient has apparent normal recent and remote memory, with an apparently normal attention span and concentration ability.   Cranial nerves: Facial symmetry is present. Speech is normal, no aphasia or dysarthria is noted. Extraocular movements are full. Visual fields are full.  Motor: The patient has good strength in all 4 extremities.  Sensory examination: Soft touch sensation is symmetric on the face, arms, and legs.  Coordination: The patient has good finger-nose-finger and heel-to-shin bilaterally.  Gait and station: The patient has a normal gait. Tandem gait is normal. Romberg is negative. No drift is seen. The patient is able to walk on heels and the toes.  Reflexes: Deep tendon reflexes are symmetric.   Assessment/Plan:  1. Bilateral foot pain, peripheral neuropathy  2. Restless leg syndrome  3. Chronic low back  pain, right leg pain  4. Neuromuscular discomfort, shoulders and neck, possible fibromyalgia  The patient has initially gained benefit with Cymbalta, we will go up on the dose. The patient will start taking Cymbalta 90 mg a day until her 30 mg tablets are used up, we will then convert to 60 mg twice daily. If this is not effective for her discomfort, we may try switching her back to Lyrica which has been effective for her discomfort in the past. She will follow-up in 5 months.  Jill Alexanders MD 11/11/2016 7:33 AM  [facility] [address] [place], [place] [address]  Phone [phone] Fax [phone]

## 2016-12-02 ENCOUNTER — Other Ambulatory Visit: Payer: Self-pay | Admitting: Physician Assistant

## 2016-12-02 DIAGNOSIS — R1310 Dysphagia, unspecified: Secondary | ICD-10-CM

## 2016-12-02 DIAGNOSIS — K219 Gastro-esophageal reflux disease without esophagitis: Secondary | ICD-10-CM

## 2016-12-03 ENCOUNTER — Encounter: Payer: Self-pay | Admitting: Neurology

## 2016-12-03 ENCOUNTER — Ambulatory Visit
Admission: RE | Admit: 2016-12-03 | Discharge: 2016-12-03 | Disposition: A | Discharge status: Home / Self Care | Payer: BC Managed Care – PPO | Source: Ambulatory Visit | Attending: Physician Assistant | Admitting: Physician Assistant

## 2016-12-03 DIAGNOSIS — K219 Gastro-esophageal reflux disease without esophagitis: Secondary | ICD-10-CM

## 2016-12-03 DIAGNOSIS — R1310 Dysphagia, unspecified: Secondary | ICD-10-CM

## 2017-01-29 ENCOUNTER — Ambulatory Visit: Payer: BC Managed Care – PPO | Admitting: Podiatry

## 2017-02-26 ENCOUNTER — Encounter: Payer: Self-pay | Admitting: Podiatry

## 2017-02-26 ENCOUNTER — Ambulatory Visit: Payer: BC Managed Care – PPO | Admitting: Podiatry

## 2017-02-26 DIAGNOSIS — Q828 Other specified congenital malformations of skin: Secondary | ICD-10-CM

## 2017-02-26 DIAGNOSIS — M722 Plantar fascial fibromatosis: Secondary | ICD-10-CM

## 2017-02-26 NOTE — Progress Notes (Signed)
This patient presents the office for continued evaluation and treatment of painful calluses on both feet.  She says these calluses are painful walking and wearing her shoes.  She says her nails have not grown long since her last visit.  She presents the office today for continued evaluation and treatment of the calluses on the bottom of both feet.   General Appearance  Alert, conversant and in no acute stress.  Vascular  Dorsalis pedis and posterior pulses are palpable  bilaterally.  Capillary return is within normal limits  Bilaterally. Temperature is within normal limits  Bilaterally  Neurologic  Senn-Weinstein monofilament wire test within normal limits  bilaterally. Muscle power  Within normal limits bilaterally.  Nails normal nails in the absence of any fungal or bacterial infection.  Orthopedic  No limitations of motion of motion feet bilaterally.  No crepitus or effusions noted.  No bony pathology or digital deformities noted. DJD dorsum left foot. HAV  B/L  Skin  normotropic skin .  No signs of infections or ulcers noted.  Porokeratosis sub 5th  B/L and sub 3 right and sub 1  B/L.  Callus sub hallux left foot.  Porokeratosis  B/L   Debridement of multiple forefoot porokeratosis bilaterally.  RTC 3 months.   Gardiner Barefoot DPM

## 2017-04-13 ENCOUNTER — Ambulatory Visit: Payer: BC Managed Care – PPO | Admitting: Adult Health

## 2017-04-13 ENCOUNTER — Encounter: Payer: Self-pay | Admitting: Adult Health

## 2017-04-13 VITALS — BP 153/76 | HR 47 | Wt 249.2 lb

## 2017-04-13 DIAGNOSIS — G8929 Other chronic pain: Secondary | ICD-10-CM | POA: Diagnosis not present

## 2017-04-13 DIAGNOSIS — R202 Paresthesia of skin: Secondary | ICD-10-CM

## 2017-04-13 DIAGNOSIS — M549 Dorsalgia, unspecified: Secondary | ICD-10-CM | POA: Diagnosis not present

## 2017-04-13 NOTE — Progress Notes (Signed)
PATIENT: [name] [name] DOB: 1956-03-07  REASON FOR VISIT: follow up HISTORY FROM: patient  HISTORY OF PRESENT ILLNESS: Today 04/13/17 [name] [name] is a 62 year old female with a history of chronic back pain and peripheral neuropathy.  She returns today for follow-up.  She is currently on Cymbalta 60 mg daily.  She reports that her primary care Dr. Dema Severin put her on gabapentin 2400 mg daily.  She reports that that helped her some benefit for her discomfort is not completely resolved.  He states that she has burning and tingling primarily in the feet when she lays down at night.  She states that this is when it bothers her the most.  She states that she was having difficulty ambulating and bearing weight but reports that that has gotten better.  She states that she is having significant burning and tingling it does make it difficult to ambulate.  She returns today for an evaluation.  HISTORY [name] [name] is a 62 year old right-handed white female with a history of obesity, chronic low back pain with degenerative arthritis involving the back, and a history of numbness and tingling and foot discomfort consistent with a peripheral neuropathy. The patient also has discomfort in the neck and shoulders that may occur, the possibility of fibromyalgia has been entertained. The patient has gained benefit in the past with Lyrica, but her insurance would not cover the medication. She is on gabapentin taking 600 mg the morning and 1200 mg in the evening without complete benefit. She is having swelling in the legs, she uses compression stockings. 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 The patient denies any significant balance issues, she has initially gained benefit with Cymbalta, she currently is on 30 mg twice daily. She is tolerating the medication well. She has restless leg  syndrome and her Requip appears to be helpful. Her feet do bother her more when she is off of her feet, better when she is walking. She oftentimes does not sleep well because of the foot discomfort. She returns to this office for further evaluation.    REVIEW OF SYSTEMS: Out of a complete 14 system review of symptoms, the patient complains only of the following symptoms, and all other reviewed systems are negative.  ALLERGIES: Allergies  Allergen Reactions  . Codeine Nausea Only  . Diclofenac Sodium Other (See Comments)    Worsens RLS    HOME MEDICATIONS: Outpatient Medications Prior to Visit  Medication Sig Dispense Refill  . acetaminophen (TYLENOL) 325 MG tablet Take 650 mg by mouth every 6 (six) hours as needed for mild pain.    . cetirizine (ZYRTEC) 10 MG tablet Take 10 mg by mouth as needed for allergies.    . DULoxetine (CYMBALTA) 60 MG capsule Take 1 capsule (60 mg total) by mouth 2 (two) times daily. 180 capsule 3  . ENBREL SURECLICK 50 MG/ML injection Every Friday    . estradiol (ESTRACE) 0.5 MG tablet Take 0.5 mg by mouth daily.    Marland Kitchen gabapentin (NEURONTIN) 600 MG tablet 600 mg. Taking 1 tablet AM and 2 tablets PM    . Multiple Vitamin (THERA) TABS Take by mouth.    Marland Kitchen MYRBETRIQ 50 MG TB24 tablet 50 mg daily.    . pantoprazole (PROTONIX) 40 MG tablet Take 40 mg by mouth 2 (two) times daily.     Marland Kitchen rOPINIRole (REQUIP)  2 MG tablet Take 2 mg by mouth at bedtime.    Marland Kitchen telmisartan (MICARDIS) 80 MG tablet 80 mg daily.    . Mirabegron (MYRBETRIQ PO) Take 25 mg by mouth daily.    Marland Kitchen aspirin EC 81 MG tablet Take 81 mg by mouth daily.    . Omega-3 Fatty Acids (FISH OIL) 1000 MG CAPS Take by mouth.    . furosemide (LASIX) 40 MG tablet 40 mg daily as needed.     Marland Kitchen losartan-hydrochlorothiazide (HYZAAR) 50-12.5 MG per tablet Take 1 tablet by mouth daily.    . potassium chloride SA (K-DUR,KLOR-CON) 20 MEQ tablet Take 20 mEq by mouth 2 (two) times daily.      No facility-administered  medications prior to visit.     PAST MEDICAL HISTORY: Past Medical History:  Diagnosis Date  . Allergic rhinitis   . Arthritis   . CKD (chronic kidney disease), stage III ([place])   . Depression   . Factor V Leiden carrier ([place]) 08/26/2011  . Fatty liver   . Fibroids    dysfuction uterine bleeding  . GERD (gastroesophageal reflux disease)   . Hypertension   . Osteoarthritis   . Peripheral neuropathy   . Protein S deficiency ([place]) 08/26/2011  . Restless leg syndrome   . RLS (restless legs syndrome)     PAST SURGICAL HISTORY: Past Surgical History:  Procedure Laterality Date  . CHOLECYSTECTOMY    . KNEE SURGERY     right TKR  . LUMBAR SPINE SURGERY    . rectu    . TUBAL LIGATION     bilateral  . URETHRAL SLING      FAMILY HISTORY: Family History  Problem Relation Age of Onset  . Coronary artery disease Father 77       deceased- TIAs  . Stroke Father   . Hypertension Mother 48       alive  . Deep vein thrombosis Mother   . Neuropathy Mother   . Osteoporosis Mother   . Liver disease Sister   . Other Sister        ITP  . Cancer Sister        breast    SOCIAL HISTORY: Social History   Socioeconomic History  . Marital status: Married    Spouse name: Not on file  . Number of children: 2  . Years of education: Not on file  . Highest education level: Not on file  Social Needs  . Financial resource strain: Not on file  . Food insecurity - worry: Not on file  . Food insecurity - inability: Not on file  . Transportation needs - medical: Not on file  . Transportation needs - non-medical: Not on file  Occupational History  . 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 Smoking status: Never Smoker  . Smokeless tobacco: Never Used  Substance and Sexual Activity  . Alcohol use: No    Alcohol/week: 0.0 oz  . Drug use: No  . Sexual activity: Not on file  Other Topics Concern  . Not on file  Social History Narrative   Lives at  home with husband.  Is retired, has 2 yr college degree.  Has 2 children.  Drinks caffeine.        PHYSICAL EXAM  Vitals:   04/13/17 1115  BP: (!) 153/76  Pulse: (!) 47  Weight: 249 lb 3.2 oz (113 kg)   Body mass index is 42.78 kg/m.  Generalized: Well developed, in no acute distress   Neurological examination  Mentation: Alert oriented to time, place, history taking. Follows all commands speech and language fluent Cranial nerve II-XII: Pupils were equal round reactive to light. Extraocular movements were full, visual field were full on confrontational test. Facial sensation and strength were normal. Uvula tongue midline. Head turning and shoulder shrug  were normal and symmetric. Motor: The motor testing reveals 5 over 5 strength of all 4 extremities. Good symmetric motor tone is noted throughout.  Sensory: Sensory testing is intact to soft touch on all 4 extremities. No evidence of extinction is noted.  Coordination: Cerebellar testing reveals good finger-nose-finger and heel-to-shin bilaterally.  Gait and station: Gait is normal. Tandem gait is normal. Romberg is negative. No drift is seen.  Reflexes: Deep tendon reflexes are symmetric and normal bilaterally.   DIAGNOSTIC DATA (LABS, IMAGING, TESTING) - I reviewed patient records, labs, notes, testing and imaging myself where available.      ASSESSMENT AND PLAN 62 y.o. year old female  has a past medical history of Allergic rhinitis, Arthritis, CKD (chronic kidney disease), stage III ([place]), Depression, Factor V Leiden carrier ([place]) (08/26/2011), Fatty liver, Fibroids, GERD (gastroesophageal reflux disease), Hypertension, Osteoarthritis, Peripheral neuropathy, Protein S deficiency ([place]) (08/26/2011), Restless leg syndrome, and RLS (restless legs syndrome). here with:  1.  Paresthesia of both feet 2.  Chronic back pain  The patient will continue on Cymbalta 60 mg daily.  We discussed using topical agents at bedtime to help with  her discomfort.  She is amenable to trying this.  A prescription will [facility] to [facility].  If this is not beneficial she will let us know.  She will follow-up in 6 months or sooner if needed.     Ward Givens, MSN, NP-C 04/13/2017, 11:18 AM [facility] [address] [place], [place] [address] [phone]

## 2017-04-13 NOTE — Progress Notes (Signed)
I have read the note, and I agree with the clinical assessment and plan.  Charles K Willis   

## 2017-04-13 NOTE — Patient Instructions (Signed)
Your Plan:  Continue Cymbalta  Try Topical cream If your symptoms worsen or you develop new symptoms please let us know.    Thank you for coming to see [place] at [facility]. I hope we have been able to provide you high quality care today.  You may receive a patient satisfaction survey over the next few weeks. We would appreciate your feedback and comments so that we may continue to improve ourselves and the health of our patients.

## 2017-04-14 NOTE — Progress Notes (Signed)
Compounded cream Rx successfully faxed to [facility].

## 2017-05-28 ENCOUNTER — Ambulatory Visit: Payer: BC Managed Care – PPO | Admitting: Podiatry

## 2017-08-04 ENCOUNTER — Other Ambulatory Visit: Payer: Self-pay | Admitting: Physician Assistant

## 2017-08-27 ENCOUNTER — Encounter (INDEPENDENT_AMBULATORY_CARE_PROVIDER_SITE_OTHER): Payer: Self-pay | Admitting: Orthopaedic Surgery

## 2017-08-27 ENCOUNTER — Ambulatory Visit (INDEPENDENT_AMBULATORY_CARE_PROVIDER_SITE_OTHER): Payer: BC Managed Care – PPO | Admitting: Orthopaedic Surgery

## 2017-08-27 ENCOUNTER — Ambulatory Visit (INDEPENDENT_AMBULATORY_CARE_PROVIDER_SITE_OTHER): Payer: BC Managed Care – PPO

## 2017-08-27 DIAGNOSIS — M545 Low back pain: Secondary | ICD-10-CM | POA: Diagnosis not present

## 2017-08-27 DIAGNOSIS — G8929 Other chronic pain: Secondary | ICD-10-CM

## 2017-08-27 MED ORDER — PREDNISONE 10 MG (21) PO TBPK: ORAL_TABLET | ORAL | 0 refills | Status: DC

## 2017-08-27 MED ORDER — TIZANIDINE HCL 4 MG PO TABS: 4.0000 mg | ORAL_TABLET | Freq: Four times a day (QID) | ORAL | 2 refills | Status: DC | PRN

## 2017-08-27 MED ORDER — MELOXICAM 7.5 MG PO TABS: 7.5000 mg | ORAL_TABLET | Freq: Two times a day (BID) | ORAL | 2 refills | Status: DC | PRN

## 2017-08-27 NOTE — Progress Notes (Signed)
Office Visit Note   Patient: [name] [name]           Date of Birth: 1956/01/31           MRN: [id number] Visit Date: 08/27/2017              Requested by: Harlan Stains, MD [place], [place] [id number] PCP: Harlan Stains, MD   Assessment & Plan: Visit Diagnoses:  1. Chronic low back pain, unspecified back pain laterality, with sciatica presence unspecified     Plan: Persistent lumbar radiculopathy.  We will try patient for prednisone taper meloxicam and Zanaflex to see if this will settle it down.  If she is not better than we need to get an MRI.  She has had previous epidural steroid injections before.  Follow-Up Instructions: Return if symptoms worsen or fail to improve.   Orders:  Orders Placed This Encounter  Procedures  . XR Lumbar Spine 2-3 Views   Meds ordered this encounter  Medications  . predniSONE (STERAPRED UNI-PAK 21 TAB) 10 MG (21) TBPK tablet    Sig: Take as directed    Dispense:  21 tablet    Refill:  0  . meloxicam (MOBIC) 7.5 MG tablet    Sig: Take 1 tablet (7.5 mg total) by mouth 2 (two) times daily as needed for pain.    Dispense:  30 tablet    Refill:  2  . tiZANidine (ZANAFLEX) 4 MG tablet    Sig: Take 1 tablet (4 mg total) by mouth every 6 (six) hours as needed for muscle spasms.    Dispense:  30 tablet    Refill:  2      Procedures: No procedures performed   Clinical Data: No additional findings.   Subjective: Chief Complaint  Patient presents with  . Lower Back - Pain  . Right Hip - Pain     [name] [name] is a 62 year old female comes in with right hip and sciatic pain in her right thigh.  Denies any groin pain.  She feels like she is feeling a little bit better.  Denies any bowel or bladder dysfunction.   Review of Systems  Constitutional: Negative.   HENT: Negative.   Eyes: Negative.   Respiratory: Negative.   Cardiovascular: Negative.   Endocrine: Negative.   Musculoskeletal: Negative.     Neurological: Negative.   Hematological: Negative.   Psychiatric/Behavioral: Negative.   All other systems reviewed and are negative.    Objective: Vital Signs: There were no vitals taken for this visit.  Physical Exam  Constitutional: She is oriented to person, place, and time. She appears well-developed and well-nourished.  HENT:  Head: Normocephalic and atraumatic.  Eyes: EOM are normal.  Neck: Neck supple.  Pulmonary/Chest: Effort normal.  Abdominal: Soft.  Neurological: She is alert and oriented to person, place, and time.  Skin: Skin is warm. Capillary refill takes less than 2 seconds.  Psychiatric: She has a normal mood and affect. Her behavior is normal. Judgment and thought content normal.  Nursing note and vitals reviewed.   Ortho Exam .  Right hip exam is unremarkable.  Mildly positive straight leg. Specialty Comments:  No specialty comments available.  Imaging: Xr Lumbar Spine 2-3 Views  Result Date: 08/27/2017 Severe lumbar spondylosis with L4-5 spondylolisthesis    PMFS History: Patient Active Problem List   Diagnosis Date Noted  . Fibromyalgia 11/11/2016  . Atrial septal hypertrophy 10/21/2015  . Paresthesia of both feet 07/31/2015  .  Bradycardia 08/14/2014  . Obesity 10/09/2013  . Chest pain 10/08/2013  . Factor V Leiden carrier ([place]) 08/26/2011  . Protein S deficiency ([place]) 08/26/2011  . MORBID OBESITY 06/06/2010  . DEPRESSION 06/06/2010  . RESTLESS LEG SYNDROME 06/06/2010  . Essential hypertension 06/06/2010  . ALLERGIC RHINITIS 06/06/2010  . GERD 06/06/2010  . FATTY LIVER DISEASE 06/06/2010  . OSTEOARTHRITIS 06/06/2010  . MYOFASCIAL PAIN SYNDROME 06/06/2010  . EDEMA 06/06/2010  . DYSPNEA 06/06/2010  . CHEST PAIN UNSPECIFIED 06/06/2010   Past Medical History:  Diagnosis Date  . Allergic rhinitis   . Arthritis   . CKD (chronic kidney disease), stage III ([place])   . Depression   . Factor V Leiden carrier ([place]) 08/26/2011  . Fatty liver    . Fibroids    dysfuction uterine bleeding  . GERD (gastroesophageal reflux disease)   . Hypertension   . Osteoarthritis   . Peripheral neuropathy   . Protein S deficiency ([place]) 08/26/2011  . Restless leg syndrome   . RLS (restless legs syndrome)     Family History  Problem Relation Age of Onset  . Coronary artery disease Father 55       deceased- TIAs  . Stroke Father   . Hypertension Mother 51       alive  . Deep vein thrombosis Mother   . Neuropathy Mother   . Osteoporosis Mother   . Liver disease Sister   . Other Sister        ITP  . Cancer Sister        breast    Past Surgical History:  Procedure Laterality Date  . CHOLECYSTECTOMY    . KNEE SURGERY     right TKR  . LUMBAR SPINE SURGERY    . rectu    . TUBAL LIGATION     bilateral  . URETHRAL SLING     Social History   Occupational History  . 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  Tobacco Use  . Smoking status: Never Smoker  . Smokeless tobacco: Never Used  Substance and Sexual Activity  . Alcohol use: No    Alcohol/week: 0.0 oz  . Drug use: No  . Sexual activity: Not on file

## 2017-09-29 DIAGNOSIS — N3281 Overactive bladder: Secondary | ICD-10-CM | POA: Insufficient documentation

## 2017-09-29 DIAGNOSIS — N3941 Urge incontinence: Secondary | ICD-10-CM | POA: Insufficient documentation

## 2017-09-29 DIAGNOSIS — G4733 Obstructive sleep apnea (adult) (pediatric): Secondary | ICD-10-CM | POA: Insufficient documentation

## 2017-10-12 ENCOUNTER — Ambulatory Visit: Payer: BC Managed Care – PPO | Admitting: Adult Health

## 2017-12-20 ENCOUNTER — Other Ambulatory Visit: Payer: Self-pay | Admitting: Neurology

## 2018-05-02 ENCOUNTER — Encounter: Payer: Self-pay | Admitting: Adult Health

## 2018-05-02 ENCOUNTER — Ambulatory Visit: Payer: Medicare Other | Admitting: Adult Health

## 2018-05-02 VITALS — BP 161/84 | HR 58 | Ht 64.0 in | Wt 242.0 lb

## 2018-05-02 DIAGNOSIS — G2581 Restless legs syndrome: Secondary | ICD-10-CM | POA: Diagnosis not present

## 2018-05-02 DIAGNOSIS — R202 Paresthesia of skin: Secondary | ICD-10-CM

## 2018-05-02 MED ORDER — DULOXETINE HCL 60 MG PO CPEP: 60.0000 mg | ORAL_CAPSULE | Freq: Two times a day (BID) | ORAL | 4 refills | Status: DC

## 2018-05-02 MED ORDER — ROPINIROLE HCL 2 MG PO TABS: 2.0000 mg | ORAL_TABLET | Freq: Every day | ORAL | 4 refills | Status: DC

## 2018-05-02 MED ORDER — GABAPENTIN 600 MG PO TABS: 1200.0000 mg | ORAL_TABLET | Freq: Two times a day (BID) | ORAL | 4 refills | Status: DC

## 2018-05-02 NOTE — Progress Notes (Signed)
I have read the note, and I agree with the clinical assessment and plan.  Corry Storie K Leolia Vinzant   

## 2018-05-02 NOTE — Progress Notes (Signed)
PATIENT: [name] [name] DOB: 12-May-1955  REASON FOR VISIT: follow up HISTORY FROM: patient  Chief Complaint  Patient presents with  . Follow-up    Rm 5, alone  . Numbness     HISTORY OF PRESENT ILLNESS: Today 05/02/18 [name] [name] is a 63 y.o. female here today for follow up of restless legs and peripheral neuropathy. She continues gabapentin 1200mg  twice daily as well as Cymbalta 60mg  twice daily. Occasionally she will have to take an extra 600mg  gabapentin at night (maybe 1-2 times a week). She is taking  Requip 2mg  at night. She feels that her medications are working as well as they can. She continues to have numbness in bilateral feet, especially toes. She denies adverse effects of medications. She tried the compounded cream but states that it did not help. She is having more muscle aches and joint pain. She is scheduled to follow up with her rheumatologist in 2 weeks. She is trying to stay active. Keeping her feet warm helps. She denies falls or problems with her gait.   HISTORY: (copied from [place] note on 04/13/2017) [name] [name] is a 63 year old female with a history of chronic back pain and peripheral neuropathy.  She returns today for follow-up.  She is currently on Cymbalta 60 mg daily. She reports that her primary care Dr. Dema Severin put her on gabapentin 2400 mg daily.  She reports that that helped her some benefit for her discomfort is not completely resolved. He states that she has burning and tingling primarily in the feet when she lays down at night.  She states that this is when it bothers her the most.  She states that she was having difficulty ambulating and bearing weight but reports that that has gotten better. She states that she is having significant burning and tingling it does make it difficult to ambulate.  She returns today for an evaluation.  REVIEW OF SYSTEMS: Out of a complete 14 system review of symptoms, the patient complains only of the following  symptoms, chills, fatigue, excessive sweating, runny nose, heat intolerance, excessive thirst, restless leg, incontinence of bladder, frequency of urination, joint pain, joint swelling, back pain, achy muscles, bruise/bleed easily, numbness of feet, and depression and all other reviewed systems are negative.  ALLERGIES: Allergies  Allergen Reactions  . Codeine Nausea Only  . Diclofenac Sodium Other (See Comments)    Worsens RLS    HOME MEDICATIONS: Outpatient Medications Prior to Visit  Medication Sig Dispense Refill  . acetaminophen (TYLENOL) 325 MG tablet Take 650 mg by mouth every 6 (six) hours as needed for mild pain.    Marland Kitchen amLODipine (NORVASC) 2.5 MG tablet Take 2.5 mg by mouth daily.    . cetirizine (ZYRTEC) 10 MG tablet Take 10 mg by mouth as needed for allergies.    Scarlette Shorts SURECLICK 50 MG/ML injection Every Friday    . estradiol (ESTRACE) 0.5 MG tablet Take 0.5 mg by mouth daily.    Marland Kitchen MYRBETRIQ 50 MG TB24 tablet 50 mg daily.    . pantoprazole (PROTONIX) 40 MG tablet Take 40 mg by mouth 2 (two) times daily.     . predniSONE (STERAPRED UNI-PAK 21 TAB) 10 MG (21) TBPK tablet Take as directed 21 tablet 0  . DULoxetine (CYMBALTA) 60 MG capsule TAKE 1 CAPSULE BY MOUTH TWICE DAILY 180 capsule 1  . gabapentin (NEURONTIN) 600 MG tablet Take 1,200 mg by mouth 2 (two) times daily. Sometimes one extra tablets prn    .  rOPINIRole (REQUIP) 2 MG tablet Take 2 mg by mouth at bedtime.    Marland Kitchen aspirin EC 81 MG tablet Take 81 mg by mouth daily.    . meloxicam (MOBIC) 7.5 MG tablet Take 1 tablet (7.5 mg total) by mouth 2 (two) times daily as needed for pain. 30 tablet 2  . Multiple Vitamin (THERA) TABS Take by mouth.    . Omega-3 Fatty Acids (FISH OIL) 1000 MG CAPS Take by mouth.    . telmisartan (MICARDIS) 80 MG tablet 80 mg daily.    Marland Kitchen tiZANidine (ZANAFLEX) 4 MG tablet Take 1 tablet (4 mg total) by mouth every 6 (six) hours as needed for muscle spasms. 30 tablet 2   No facility-administered  medications prior to visit.     PAST MEDICAL HISTORY: Past Medical History:  Diagnosis Date  . Allergic rhinitis   . Arthritis   . CKD (chronic kidney disease), stage III ([place])   . Depression   . Factor V Leiden carrier ([place]) 08/26/2011  . Fatty liver   . Fibroids    dysfuction uterine bleeding  . GERD (gastroesophageal reflux disease)   . Hypertension   . Osteoarthritis   . Peripheral neuropathy   . Protein S deficiency ([place]) 08/26/2011  . Restless leg syndrome   . RLS (restless legs syndrome)     PAST SURGICAL HISTORY: Past Surgical History:  Procedure Laterality Date  . CHOLECYSTECTOMY    . KNEE SURGERY     right TKR  . LUMBAR SPINE SURGERY    . rectu    . TUBAL LIGATION     bilateral  . URETHRAL SLING      FAMILY HISTORY: Family History  Problem Relation Age of Onset  . Coronary artery disease Father 25       deceased- TIAs  . Stroke Father   . Hypertension Mother 72       alive  . Deep vein thrombosis Mother   . Neuropathy Mother   . Osteoporosis Mother   . Liver disease Sister   . Other Sister        ITP  . Cancer Sister        breast    SOCIAL HISTORY: Social History   Socioeconomic History  . Marital status: Married    Spouse name: Not on file  . Number of children: 2  . Years of education: Not on file  . Highest education level: Not on file  Occupational History  . 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  . Financial resource strain: Not on file  . Food insecurity:    Worry: Not on file    Inability: Not on file  . Transportation needs:    Medical: Not on file    Non-medical: Not on file  Tobacco Use  . Smoking status: Never Smoker  . Smokeless tobacco: Never Used  Substance and Sexual Activity  . Alcohol use: No    Alcohol/week: 0.0 standard drinks  . Drug use: No  . Sexual activity: Not on file  Lifestyle  . Physical activity:    Days per week: Not on file    Minutes per session: Not on  file  . Stress: Not on file  Relationships  . Social connections:    Talks on phone: Not on file    Gets together: Not on file    Attends religious service: Not on file    Active member of club or organization: Not  on file    Attends meetings of clubs or organizations: Not on file    Relationship status: Not on file  . Intimate partner violence:    Fear of current or ex partner: Not on file    Emotionally abused: Not on file    Physically abused: Not on file    Forced sexual activity: Not on file  Other Topics Concern  . Not on file  Social History Narrative   Lives at home with husband.  Is retired, has 2 yr college degree.  Has 2 children.  Drinks caffeine.        PHYSICAL EXAM  Vitals:   05/02/18 1011  BP: (!) 161/84  Pulse: (!) 58  Weight: 242 lb (109.8 kg)  Height: 5\' 4"  (1.626 m)   Body mass index is 41.54 kg/m.  Generalized: Well developed, in no acute distress  Cardiology: normal rate and rhythm, no murmur noted Neurological examination  Mentation: Alert oriented to time, place, history taking. Follows all commands speech and language fluent Cranial nerve II-XII: Pupils were equal round reactive to light. Extraocular movements were full, visual field were full on confrontational test. Facial sensation and strength were normal. Uvula tongue midline. Head turning and shoulder shrug  were normal and symmetric. Motor: The motor testing reveals 5 over 5 strength of all 4 extremities. Good symmetric motor tone is noted throughout.  Sensory: Sensory testing is intact to soft touch on all 4 extremities. No evidence of extinction is noted.  Coordination: Cerebellar testing reveals good finger-nose-finger and heel-to-shin bilaterally.  Gait and station: Gait is normal.  Reflexes: Deep tendon reflexes are symmetric and normal bilaterally.   DIAGNOSTIC DATA (LABS, IMAGING, TESTING) - I reviewed patient records, labs, notes, testing and imaging myself where available.  No  flowsheet data found.   Lab Results  Component Value Date   WBC 10.0 10/09/2013   HGB 12.9 10/09/2013   HCT 38.8 10/09/2013   MCV 87.6 10/09/2013   PLT 177 10/09/2013      Component Value Date/Time   NA 139 10/09/2013 0023   K 4.2 10/09/2013 0023   CL 100 10/09/2013 0023   CO2 23 10/09/2013 0023   GLUCOSE 134 (H) 10/09/2013 0023   BUN 22 10/09/2013 0023   CREATININE 1.00 10/09/2013 0023   CALCIUM 9.0 10/09/2013 0023   PROT 6.4 06/13/2015 1213   ALBUMIN 3.2 (L) 10/12/2012 0640   AST 13 10/12/2012 0640   ALT 14 10/12/2012 0640   ALKPHOS 99 10/12/2012 0640   BILITOT 0.7 10/12/2012 0640   GFRNONAA 60 (L) 10/09/2013 0023   GFRAA 70 (L) 10/09/2013 0023   No results found for: CHOL, HDL, LDLCALC, LDLDIRECT, TRIG, CHOLHDL No results found for: HGBA1C No results found for: VITAMINB12 Lab Results  Component Value Date   TSH 0.69 08/14/2013       ASSESSMENT AND PLAN 63 y.o. year old female  has a past medical history of Allergic rhinitis, Arthritis, CKD (chronic kidney disease), stage III ([place]), Depression, Factor V Leiden carrier ([place]) (08/26/2011), Fatty liver, Fibroids, GERD (gastroesophageal reflux disease), Hypertension, Osteoarthritis, Peripheral neuropathy, Protein S deficiency ([place]) (08/26/2011), Restless leg syndrome, and RLS (restless legs syndrome). here with     ICD-10-CM   1. RESTLESS LEG SYNDROME G25.81 rOPINIRole (REQUIP) 2 MG tablet  2. Paresthesia of both feet R20.2 gabapentin (NEURONTIN) 600 MG tablet    DULoxetine (CYMBALTA) 60 MG capsule    We will continue gabapentin, Cymbalta and Requip as prescribed. I have provided additional  information in AVS of other treatments she can try at home. She was advised to monitor feet regularly for sores/wounds. Follow up with rheumatology for muscle aches and joint pain. Will follow up in 1 year, sooner if needed.    No orders of the defined types were placed in this encounter.    Meds ordered this encounter    Medications  . gabapentin (NEURONTIN) 600 MG tablet    Sig: Take 2 tablets (1,200 mg total) by mouth 2 (two) times daily. Sometimes one extra tablets prn    Dispense:  120 tablet    Refill:  4    Order Specific Question:   Supervising Provider    Answer:   Melvenia Beam [id number]  . DULoxetine (CYMBALTA) 60 MG capsule    Sig: Take 1 capsule (60 mg total) by mouth 2 (two) times daily.    Dispense:  180 capsule    Refill:  4    Order Specific Question:   Supervising Provider    Answer:   Melvenia Beam [id number]  . rOPINIRole (REQUIP) 2 MG tablet    Sig: Take 1 tablet (2 mg total) by mouth at bedtime.    Dispense:  90 tablet    Refill:  4    Order Specific Question:   Supervising Provider    Answer:   Melvenia Beam [id number], FNP-C 05/02/2018, 10:53 AM [facility] [address], [place], [place] [address] [phone]

## 2018-05-02 NOTE — Patient Instructions (Signed)
Continue gabapentin, Cymbalta and Requip as prescribed Follow up with rheumatology for muscles and joint pain Follow up 1 year, unless you need [place] soon  Restless Legs Syndrome Restless legs syndrome is a condition that causes uncomfortable feelings or sensations in the legs, especially while sitting or lying down. The sensations usually cause an overwhelming urge to move the legs. The arms can also sometimes be affected. The condition can range from mild to severe. The symptoms often interfere with a person's ability to sleep. What are the causes? The cause of this condition is not known. What increases the risk? The following factors may make you more likely to develop this condition:  Being older than 50.  Pregnancy.  Being a woman. In general, the condition is more common in women than in men.  A family history of the condition.  Having iron deficiency.  Overuse of caffeine, nicotine, or alcohol.  Certain medical conditions, such as kidney disease, Parkinson's disease, or nerve damage.  Certain medicines, such as those for high blood pressure, nausea, colds, allergies, depression, and some heart conditions. What are the signs or symptoms? The main symptom of this condition is uncomfortable sensations in the legs, such as:  Pulling.  Tingling.  Prickling.  Throbbing.  Crawling.  Burning. Usually, the sensations:  Affect both sides of the body.  Are worse when you sit or lie down.  Are worse at night. These may wake you up or make it difficult to fall asleep.  Make you have a strong urge to move your legs.  Are temporarily relieved by moving your legs. The arms can also be affected, but this is rare. People who have this condition often have tiredness during the day because of their lack of sleep at night. How is this diagnosed? This condition may be diagnosed based on:  Your symptoms.  Blood tests. In some cases, you may be monitored in a sleep lab by a  specialist (a sleep study). This can detect any disruptions in your sleep. How is this treated? This condition is treated by managing the symptoms. This may include:  Lifestyle changes, such as exercising, using relaxation techniques, and avoiding caffeine, alcohol, or tobacco.  Medicines. Anti-seizure medicines may be tried first. Follow these instructions at home:     General instructions  Take over-the-counter and prescription medicines only as told by your health care provider.  Use methods to help relieve the uncomfortable sensations, such as: ? Massaging your legs. ? Walking or stretching. ? Taking a cold or hot bath.  Keep all follow-up visits as told by your health care provider. This is important. Lifestyle  Practice good sleep habits. For example, go to bed and get up at the same time every day. Most adults should get 7-9 hours of sleep each night.  Exercise regularly. Try to get at least 30 minutes of exercise most days of the week.  Practice ways of relaxing, such as yoga or meditation.  Avoid caffeine and alcohol.  Do not use any products that contain nicotine or tobacco, such as cigarettes and e-cigarettes. If you need help quitting, ask your health care provider. Contact a health care provider if:  Your symptoms get worse or they do not improve with treatment. Summary  Restless legs syndrome is a condition that causes uncomfortable feelings or sensations in the legs, especially while sitting or lying down.  The symptoms often interfere with a person's ability to sleep.  This condition is treated by managing the symptoms. You may  need to make lifestyle changes or take medicines. This information is not intended to replace advice given to you by your health care provider. Make sure you discuss any questions you have with your health care provider. Document Released: 03/06/2002 Document Revised: 04/05/2017 Document Reviewed: 04/05/2017 Elsevier Interactive  Patient Education  2019 [employer]. Peripheral Neuropathy Peripheral neuropathy is a type of nerve damage. It affects nerves that carry signals between the spinal cord and the arms, legs, and the rest of the body (peripheral nerves). It does not affect nerves in the spinal cord or brain. In peripheral neuropathy, one nerve or a group of nerves may be damaged. Peripheral neuropathy is a broad category that includes many specific nerve disorders, like diabetic neuropathy, hereditary neuropathy, and carpal tunnel syndrome. What are the causes? This condition may be caused by:  Diabetes. This is the most common cause of peripheral neuropathy.  Nerve injury.  Pressure or stress on a nerve that lasts a long time.  Lack (deficiency) of B vitamins. This can result from alcoholism, poor diet, or a restricted diet.  Infections.  Autoimmune diseases, such as rheumatoid arthritis and systemic lupus erythematosus.  Nerve diseases that are passed from parent to child (inherited).  Some medicines, such as cancer medicines (chemotherapy).  Poisonous (toxic) substances, such as lead and mercury.  Too little blood flowing to the legs.  Kidney disease.  Thyroid disease. In some cases, the cause of this condition is not known. What are the signs or symptoms? Symptoms of this condition depend on which of your nerves is damaged. Common symptoms include:  Loss of feeling (numbness) in the feet, hands, or both.  Tingling in the feet, hands, or both.  Burning pain.  Very sensitive skin.  Weakness.  Not being able to move a part of the body (paralysis).  Muscle twitching.  Clumsiness or poor coordination.  Loss of balance.  Not being able to control your bladder.  Feeling dizzy.  Sexual problems. How is this diagnosed? Diagnosing and finding the cause of peripheral neuropathy can be difficult. Your health care provider will take your medical history and do a physical exam. A  neurological exam will also be done. This involves checking things that are affected by your brain, spinal cord, and nerves (nervous system). For example, your health care provider will check your reflexes, how you move, and what you can feel. You may have other tests, such as:  Blood tests.  Electromyogram (EMG) and nerve conduction tests. These tests check nerve function and how well the nerves are controlling the muscles.  Imaging tests, such as CT scans or MRI to rule out other causes of your symptoms.  Removing a small piece of nerve to be examined in a lab (nerve biopsy). This is rare.  Removing and examining a small amount of the fluid that surrounds the brain and spinal cord (lumbar puncture). This is rare. How is this treated? Treatment for this condition may involve:  Treating the underlying cause of the neuropathy, such as diabetes, kidney disease, or vitamin deficiencies.  Stopping medicines that can cause neuropathy, such as chemotherapy.  Medicine to relieve pain. Medicines may include: ? Prescription or over-the-counter pain medicine. ? Antiseizure medicine. ? Antidepressants. ? Pain-relieving patches that are applied to painful areas of skin.  Surgery to relieve pressure on a nerve or to destroy a nerve that is causing pain.  Physical therapy to help improve movement and balance.  Devices to help you move around (assistive devices). Follow these  instructions at home: Medicines  Take over-the-counter and prescription medicines only as told by your health care provider. Do not take any other medicines without first asking your health care provider.  Do not drive or use heavy machinery while taking prescription pain medicine. Lifestyle   Do not use any products that contain nicotine or tobacco, such as cigarettes and e-cigarettes. Smoking keeps blood from reaching damaged nerves. If you need help quitting, ask your health care provider.  Avoid or limit alcohol.  Too much alcohol can cause a vitamin B deficiency, and vitamin B is needed for healthy nerves.  Eat a healthy diet. This includes: ? Eating foods that are high in fiber, such as fresh fruits and vegetables, whole grains, and beans. ? Limiting foods that are high in fat and processed sugars, such as fried or sweet foods. General instructions   If you have diabetes, work closely with your health care provider to keep your blood sugar under control.  If you have numbness in your feet: ? Check every day for signs of injury or infection. Watch for redness, warmth, and swelling. ? Wear padded socks and comfortable shoes. These help protect your feet.  Develop a good support system. Living with peripheral neuropathy can be stressful. Consider talking with a mental health specialist or joining a support group.  Use assistive devices and attend physical therapy as told by your health care provider. This may include using a walker or a cane.  Keep all follow-up visits as told by your health care provider. This is important. Contact a health care provider if:  You have new signs or symptoms of peripheral neuropathy.  You are struggling emotionally from dealing with peripheral neuropathy.  Your pain is not well-controlled. Get help right away if:  You have an injury or infection that is not healing normally.  You develop new weakness in an arm or leg.  You fall frequently. Summary  Peripheral neuropathy is when the nerves in the arms, or legs are damaged, resulting in numbness, weakness, or pain.  There are many causes of peripheral neuropathy, including diabetes, pinched nerves, vitamin deficiencies, autoimmune disease, and hereditary conditions.  Diagnosing and finding the cause of peripheral neuropathy can be difficult. Your health care provider will take your medical history, do a physical exam, and do tests, including blood tests and nerve function tests.  Treatment involves  treating the underlying cause of the neuropathy and taking medicines to help control pain. Physical therapy and assistive devices may also help. This information is not intended to replace advice given to you by your health care provider. Make sure you discuss any questions you have with your health care provider. Document Released: 03/06/2002 Document Revised: 05/25/2016 Document Reviewed: 05/25/2016 Elsevier Interactive Patient Education  2019 [employer].

## 2018-05-08 ENCOUNTER — Encounter: Payer: Self-pay | Admitting: Adult Health

## 2018-05-09 ENCOUNTER — Other Ambulatory Visit: Payer: Self-pay | Admitting: *Deleted

## 2018-05-09 DIAGNOSIS — R202 Paresthesia of skin: Secondary | ICD-10-CM

## 2018-05-09 MED ORDER — GABAPENTIN 600 MG PO TABS: ORAL_TABLET | ORAL | 11 refills | Status: DC

## 2018-05-23 ENCOUNTER — Telehealth: Payer: Self-pay | Admitting: Neurology

## 2018-05-23 DIAGNOSIS — R202 Paresthesia of skin: Secondary | ICD-10-CM

## 2018-05-23 NOTE — Telephone Encounter (Signed)
Pt has called re: her script on her Gabapentin being for 5 a day now, current script says for 4, she is asking for a new script because she is running out.  [place], [place] #1 Pt also states there is a procedure she is trying to have done at  [facility] for her neuropathy.  Pt states a prior authorization is needed.  Pt is asking for a call to discuss

## 2018-05-24 MED ORDER — GABAPENTIN 600 MG PO TABS: ORAL_TABLET | ORAL | 11 refills | Status: DC

## 2018-05-24 NOTE — Addendum Note (Signed)
Addended by: Verlin Grills T on: 05/24/2018 08:20 AM   Modules accepted: Orders

## 2018-05-24 NOTE — Telephone Encounter (Signed)
I called the patient.  The patient has already been seen through the [place] [facility], she indicates that they do treat peripheral neuropathy pain, I will set up a referral.

## 2018-05-24 NOTE — Telephone Encounter (Signed)
I contacted the patient and advised via vm I have submitted the gabapentin request for 5 total daily. I also advised I would be happy to discuss her questions in regard to the procedure she is having through [facility], but also stated if a prior authorization is needed it may need to come directly from [place]. Patient advised to call back and further discuss if need be.

## 2018-05-24 NOTE — Telephone Encounter (Signed)
I contacted the pt. She is requesting a referral to be placed to [facility]  Address: [address], [place], [place] [address] Phone: [phone]  Patient states she is requesting this referral so she can receive injections for her neuropathy foot pain. I advised I would send request to MD pt was agreeable.

## 2018-05-24 NOTE — Addendum Note (Signed)
Addended by: Kathrynn Ducking on: 05/24/2018 04:56 PM   Modules accepted: Orders

## 2018-05-24 NOTE — Telephone Encounter (Signed)
Patient would like to speak to you about note from yesterday. Please call her at [phone]

## 2018-06-09 ENCOUNTER — Encounter: Payer: Self-pay | Admitting: Adult Health

## 2018-06-23 ENCOUNTER — Encounter: Payer: Self-pay | Admitting: Adult Health

## 2018-07-26 ENCOUNTER — Encounter (INDEPENDENT_AMBULATORY_CARE_PROVIDER_SITE_OTHER): Payer: Self-pay | Admitting: Orthopaedic Surgery

## 2018-07-29 ENCOUNTER — Ambulatory Visit (INDEPENDENT_AMBULATORY_CARE_PROVIDER_SITE_OTHER): Payer: Self-pay

## 2018-07-29 ENCOUNTER — Encounter (INDEPENDENT_AMBULATORY_CARE_PROVIDER_SITE_OTHER): Payer: Self-pay | Admitting: Orthopaedic Surgery

## 2018-07-29 ENCOUNTER — Ambulatory Visit (INDEPENDENT_AMBULATORY_CARE_PROVIDER_SITE_OTHER): Payer: Medicare Other | Admitting: Orthopaedic Surgery

## 2018-07-29 ENCOUNTER — Other Ambulatory Visit: Payer: Self-pay

## 2018-07-29 DIAGNOSIS — M545 Low back pain: Secondary | ICD-10-CM

## 2018-07-29 DIAGNOSIS — M25511 Pain in right shoulder: Secondary | ICD-10-CM

## 2018-07-29 DIAGNOSIS — G8929 Other chronic pain: Secondary | ICD-10-CM

## 2018-07-29 MED ORDER — LIDOCAINE HCL 2 % IJ SOLN
2.0000 mL | INTRAMUSCULAR | Status: AC | PRN
  Administered 2018-07-29: 2 mL

## 2018-07-29 MED ORDER — BUPIVACAINE HCL 0.25 % IJ SOLN
2.0000 mL | INTRAMUSCULAR | Status: AC | PRN
  Administered 2018-07-29: 2 mL via INTRA_ARTICULAR

## 2018-07-29 MED ORDER — METHYLPREDNISOLONE ACETATE 40 MG/ML IJ SUSP
40.0000 mg | INTRAMUSCULAR | Status: AC | PRN
  Administered 2018-07-29: 09:00:00 40 mg via INTRA_ARTICULAR

## 2018-07-29 NOTE — Progress Notes (Signed)
Office Visit Note   Patient: [name] [name]           Date of Birth: [birth year]           MRN: [id number] Visit Date: 07/29/2018              Requested by: Harlan Stains, MD [address], [place] [address] PCP: Harlan Stains, MD   Assessment & Plan: Visit Diagnoses:  1. Chronic right-sided low back pain, unspecified whether sciatica present   2. Acute pain of right shoulder     Plan: Impression is lumbar spondylosis and right shoulder subacromial bursitis and rotator cuff tendinitis.  In regards to the lumbar spine, I will refer her back to Dr. Ronnald Ramp at this time for further management.  In regards to her right shoulder, I injected the subacromial space with cortisone today.  She will give this 2 weeks and if she is still in pain, she will let us know and we will get an MRI to further assess things.  Follow-Up Instructions: Return if symptoms worsen or fail to improve.   Orders:  Orders Placed This Encounter  Procedures  . Large Joint Inj: R subacromial bursa  . XR Lumbar Spine 2-3 Views  . XR Shoulder Right  . Ambulatory referral to Neurosurgery   No orders of the defined types were placed in this encounter.     Procedures: Large Joint Inj: R subacromial bursa on 07/29/2018 9:12 AM Indications: pain Details: 22 G needle Medications: 2 mL bupivacaine 0.25 %; 2 mL lidocaine 2 %; 40 mg methylPREDNISolone acetate 40 MG/ML Outcome: tolerated well, no immediate complications Patient was prepped and draped in the usual sterile fashion.       Clinical Data: No additional findings.   Subjective: Chief Complaint  Patient presents with  . Lower Back - Pain    HPI patient is a pleasant 63 year old female who presents our clinic today with recurrent lower back pain.  She has had years of intermittent back pain.  She noticed this new onset approximately 2 weeks ago.  She notes that at that time she was walking her son's dog when the dog pulled one  way and she turned the opposite way.  She thinks this may have flared things up.  She also notes that she has been on a significant dose of prednisone up until 2 weeks ago for her rheumatologic issues.  Unsure whether stopping this contributed to her noticing more symptoms in her back.  The pain she has is to the entire lower back.  She describes this as a constant dull ache.  Pain is worse with ambulation.  She gets some relief when she is sleeping.  She has taken Tylenol, Advil and tramadol which seems to minimally help.  She does note she is not supposed take any anti-inflammatories due to chronic kidney disease.  She denies any numbness, tingling or burning to the lower extremities and denies any weakness as well.  She has had epidural steroid injections in the past as well as lumbar surgery by Dr. Ronnald Ramp over a decade ago.  Other issue she brings up today is her right shoulder.  Pain following an injury which occurred 2 weeks ago as described above.  Since then she has had pain to the entire right shoulder radiating into the deltoid.  This is intermittent in nature but worse with any motion of the shoulder.  She denies any radicular symptoms.  No previous injection or surgical  intervention.  Review of Systems as detailed in HPI.  All others reviewed and are negative.   Objective: Vital Signs: There were no vitals taken for this visit.  Physical Exam well-developed well-nourished female no acute distress.  Alert and oriented x3.  Ortho Exam examination of her lower back reveals marked paraspinous tenderness.  No spinous tenderness.  Increased pain with lumbar flexion greater than extension.  Minimally positive straight leg raise.  Negative logroll.  No focal weakness.  Examination of right shoulder reveals near full active range of motion although she has moderate pain with the extremes.  Moderately positive empty can and cross body adduction.  Negative drop arm.  Full sensation distally.  Specialty  Comments:  No specialty comments available.  Imaging: Xr Lumbar Spine 2-3 Views  Result Date: 07/29/2018 Marked degenerative changes throughout specifically at L5-S1 with what appears to be retrolisthesis at L5  Xr Shoulder Right  Result Date: 07/29/2018 X-rays of the right shoulder show slight severe migration of the humeral head    PMFS History: Patient Active Problem List   Diagnosis Date Noted  . Fibromyalgia 11/11/2016  . Atrial septal hypertrophy 10/21/2015  . Paresthesia of both feet 07/31/2015  . Bradycardia 08/14/2014  . Obesity 10/09/2013  . Chest pain 10/08/2013  . Factor V Leiden carrier ([place]) 08/26/2011  . Protein S deficiency ([place]) 08/26/2011  . MORBID OBESITY 06/06/2010  . DEPRESSION 06/06/2010  . RESTLESS LEG SYNDROME 06/06/2010  . Essential hypertension 06/06/2010  . ALLERGIC RHINITIS 06/06/2010  . GERD 06/06/2010  . FATTY LIVER DISEASE 06/06/2010  . OSTEOARTHRITIS 06/06/2010  . MYOFASCIAL PAIN SYNDROME 06/06/2010  . EDEMA 06/06/2010  . DYSPNEA 06/06/2010  . CHEST PAIN UNSPECIFIED 06/06/2010   Past Medical History:  Diagnosis Date  . Allergic rhinitis   . Arthritis   . CKD (chronic kidney disease), stage III ([place])   . Depression   . Factor V Leiden carrier ([place]) 08/26/2011  . Fatty liver   . Fibroids    dysfuction uterine bleeding  . GERD (gastroesophageal reflux disease)   . Hypertension   . Osteoarthritis   . Peripheral neuropathy   . Protein S deficiency ([place]) 08/26/2011  . Restless leg syndrome   . RLS (restless legs syndrome)     Family History  Problem Relation Age of Onset  . Coronary artery disease Father 73       deceased- TIAs  . Stroke Father   . Hypertension Mother 37       alive  . Deep vein thrombosis Mother   . Neuropathy Mother   . Osteoporosis Mother   . Liver disease Sister   . Other Sister        ITP  . Cancer Sister        breast    Past Surgical History:  Procedure Laterality Date  . CHOLECYSTECTOMY    . KNEE  SURGERY     right TKR  . LUMBAR SPINE SURGERY    . rectu    . TUBAL LIGATION     bilateral  . URETHRAL SLING     Social History   Occupational History  . 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 Smoking status: Never Smoker  . Smokeless tobacco: Never Used  Substance and Sexual Activity  . Alcohol use: No    Alcohol/week: 0.0 standard drinks  . Drug use: No  . Sexual activity: Not on file

## 2018-08-01 ENCOUNTER — Emergency Department (HOSPITAL_COMMUNITY)
Admission: EM | Admit: 2018-08-01 | Discharge: 2018-08-02 | Disposition: A | Discharge status: Home / Self Care | Payer: Medicare Other | Attending: Emergency Medicine | Admitting: Emergency Medicine

## 2018-08-01 ENCOUNTER — Encounter (HOSPITAL_COMMUNITY): Payer: Self-pay | Admitting: Emergency Medicine

## 2018-08-01 ENCOUNTER — Other Ambulatory Visit: Payer: Self-pay

## 2018-08-01 DIAGNOSIS — L03116 Cellulitis of left lower limb: Secondary | ICD-10-CM | POA: Insufficient documentation

## 2018-08-01 DIAGNOSIS — N183 Chronic kidney disease, stage 3 (moderate): Secondary | ICD-10-CM | POA: Insufficient documentation

## 2018-08-01 DIAGNOSIS — E876 Hypokalemia: Secondary | ICD-10-CM | POA: Insufficient documentation

## 2018-08-01 DIAGNOSIS — I129 Hypertensive chronic kidney disease with stage 1 through stage 4 chronic kidney disease, or unspecified chronic kidney disease: Secondary | ICD-10-CM | POA: Diagnosis not present

## 2018-08-01 DIAGNOSIS — T887XXA Unspecified adverse effect of drug or medicament, initial encounter: Secondary | ICD-10-CM | POA: Insufficient documentation

## 2018-08-01 DIAGNOSIS — R2242 Localized swelling, mass and lump, left lower limb: Secondary | ICD-10-CM | POA: Diagnosis present

## 2018-08-01 DIAGNOSIS — Z96651 Presence of right artificial knee joint: Secondary | ICD-10-CM | POA: Diagnosis not present

## 2018-08-01 DIAGNOSIS — Y658 Other specified misadventures during surgical and medical care: Secondary | ICD-10-CM | POA: Diagnosis not present

## 2018-08-01 DIAGNOSIS — Z79899 Other long term (current) drug therapy: Secondary | ICD-10-CM | POA: Insufficient documentation

## 2018-08-01 DIAGNOSIS — T502X5A Adverse effect of carbonic-anhydrase inhibitors, benzothiadiazides and other diuretics, initial encounter: Secondary | ICD-10-CM | POA: Insufficient documentation

## 2018-08-01 NOTE — ED Triage Notes (Signed)
Pt reports left foot swelling and redness x 1 week, had televisit with PCP today who told her to come to ED for IV abx if she ran a fever. Pt started taking PO amoxicillin today.  Fever at home was 100 today. A&O x4.

## 2018-08-02 LAB — CBC WITH DIFFERENTIAL/PLATELET
Abs Immature Granulocytes: 0.03 10*3/uL (ref 0.00–0.07)
Basophils Absolute: 0.1 10*3/uL (ref 0.0–0.1)
Basophils Relative: 1 %
Eosinophils Absolute: 0.1 10*3/uL (ref 0.0–0.5)
Eosinophils Relative: 1 %
HCT: 39.9 % (ref 36.0–46.0)
Hemoglobin: 12.9 g/dL (ref 12.0–15.0)
Immature Granulocytes: 0 %
Lymphocytes Relative: 25 %
Lymphs Abs: 2.4 10*3/uL (ref 0.7–4.0)
MCH: 29.4 pg (ref 26.0–34.0)
MCHC: 32.3 g/dL (ref 30.0–36.0)
MCV: 90.9 fL (ref 80.0–100.0)
Monocytes Absolute: 0.8 10*3/uL (ref 0.1–1.0)
Monocytes Relative: 9 %
Neutro Abs: 5.9 10*3/uL (ref 1.7–7.7)
Neutrophils Relative %: 64 %
Platelets: 240 10*3/uL (ref 150–400)
RBC: 4.39 MIL/uL (ref 3.87–5.11)
RDW: 17 % — ABNORMAL HIGH (ref 11.5–15.5)
WBC: 9.4 10*3/uL (ref 4.0–10.5)
nRBC: 0 % (ref 0.0–0.2)

## 2018-08-02 LAB — BASIC METABOLIC PANEL
Anion gap: 16 — ABNORMAL HIGH (ref 5–15)
BUN: 17 mg/dL (ref 8–23)
CO2: 23 mmol/L (ref 22–32)
Calcium: 9.2 mg/dL (ref 8.9–10.3)
Chloride: 102 mmol/L (ref 98–111)
Creatinine, Ser: 1.18 mg/dL — ABNORMAL HIGH (ref 0.44–1.00)
GFR calc Af Amer: 56 mL/min — ABNORMAL LOW (ref >60)
GFR calc non Af Amer: 48 mL/min — ABNORMAL LOW (ref >60)
Glucose, Bld: 123 mg/dL — ABNORMAL HIGH (ref 70–99)
Potassium: 3 mmol/L — ABNORMAL LOW (ref 3.5–5.1)
Sodium: 141 mmol/L (ref 135–145)

## 2018-08-02 MED ORDER — POTASSIUM CHLORIDE CRYS ER 20 MEQ PO TBCR
40.0000 meq | EXTENDED_RELEASE_TABLET | Freq: Once | ORAL | Status: AC
  Administered 2018-08-02: 40 meq via ORAL
  Filled 2018-08-02: qty 2

## 2018-08-02 MED ORDER — POTASSIUM CHLORIDE CRYS ER 20 MEQ PO TBCR: 20.0000 meq | EXTENDED_RELEASE_TABLET | Freq: Every day | ORAL | 0 refills | Status: AC

## 2018-08-02 MED ORDER — CEPHALEXIN 500 MG PO CAPS: 500.0000 mg | ORAL_CAPSULE | Freq: Three times a day (TID) | ORAL | 0 refills | Status: DC

## 2018-08-02 MED ORDER — CEFAZOLIN SODIUM-DEXTROSE 2-4 GM/100ML-% IV SOLN
2.0000 g | Freq: Once | INTRAVENOUS | Status: AC
  Administered 2018-08-02: 01:00:00 2 g via INTRAVENOUS
  Filled 2018-08-02: qty 100

## 2018-08-02 NOTE — ED Provider Notes (Signed)
[facility] Provider Note   CSN: [id number] Arrival date & time: 08/01/18  2002    History   Chief Complaint Chief Complaint  Patient presents with  . Foot Swelling    HPI [name] [name] is a 63 y.o. female.   The history is provided by the patient.  She has history of hypertension, chronic kidney disease and comes in because of redness and swelling of her left leg.  And has been swollen for about the last week and started turning red 4 days ago.  She has had a few chills.  Tonight she had a fever to 100 degrees.  She had seen her PCP who started her on amoxicillin-clavulanic acid and she has taken 1 dose of that.  She was advised to come to the ED if she had a fever.  She denies chest pain and denies shortness of breath.  She is complaining of some mild soreness in her left leg.  Past Medical History:  Diagnosis Date  . Allergic rhinitis   . Arthritis   . CKD (chronic kidney disease), stage III ([place])   . Depression   . Factor V Leiden carrier ([place]) 08/26/2011  . Fatty liver   . Fibroids    dysfuction uterine bleeding  . GERD (gastroesophageal reflux disease)   . Hypertension   . Osteoarthritis   . Peripheral neuropathy   . Protein S deficiency ([place]) 08/26/2011  . Restless leg syndrome   . RLS (restless legs syndrome)     Patient Active Problem List   Diagnosis Date Noted  . Fibromyalgia 11/11/2016  . Atrial septal hypertrophy 10/21/2015  . Paresthesia of both feet 07/31/2015  . Bradycardia 08/14/2014  . Obesity 10/09/2013  . Chest pain 10/08/2013  . Factor V Leiden carrier ([place]) 08/26/2011  . Protein S deficiency ([place]) 08/26/2011  . MORBID OBESITY 06/06/2010  . DEPRESSION 06/06/2010  . RESTLESS LEG SYNDROME 06/06/2010  . Essential hypertension 06/06/2010  . ALLERGIC RHINITIS 06/06/2010  . GERD 06/06/2010  . FATTY LIVER DISEASE 06/06/2010  . OSTEOARTHRITIS 06/06/2010  . MYOFASCIAL PAIN SYNDROME 06/06/2010  . EDEMA  06/06/2010  . DYSPNEA 06/06/2010  . CHEST PAIN UNSPECIFIED 06/06/2010    Past Surgical History:  Procedure Laterality Date  . CHOLECYSTECTOMY    . KNEE SURGERY     right TKR  . LUMBAR SPINE SURGERY    . rectu    . TUBAL LIGATION     bilateral  . URETHRAL SLING       OB History   No obstetric history on file.      Home Medications    Prior to Admission medications   Medication Sig Start Date End Date Taking? Authorizing Provider  acetaminophen (TYLENOL) 325 MG tablet Take 650 mg by mouth every 6 (six) hours as needed for mild pain.    [provider]  amLODipine (NORVASC) 2.5 MG tablet Take 2.5 mg by mouth daily.    [provider]  amLODipine (NORVASC) 5 MG tablet  07/07/18   [provider]  cetirizine (ZYRTEC) 10 MG tablet Take 10 mg by mouth as needed for allergies.    [provider]  DULoxetine (CYMBALTA) 60 MG capsule Take 1 capsule (60 mg total) by mouth 2 (two) times daily. 05/02/18   Debbora Presto, NP  ENBREL SURECLICK 50 MG/ML injection Every Friday 04/09/17   [provider]  estradiol (ESTRACE) 0.5 MG tablet Take 0.5 mg by mouth daily.    [provider]  gabapentin (NEURONTIN) 600 MG tablet Take 2 tablets in am and pm and 1 tablet at noon. 05/24/18   Kathrynn Ducking, MD  losartan-hydrochlorothiazide [facility]) 50-12.5 MG tablet  07/07/18   [provider]  MYRBETRIQ 50 MG TB24 tablet 50 mg daily. 03/29/17   [provider]  pantoprazole (PROTONIX) 40 MG tablet Take 40 mg by mouth 2 (two) times daily.     [provider]  predniSONE (STERAPRED UNI-PAK 21 TAB) 10 MG (21) TBPK tablet Take as directed 08/27/17   Leandrew Koyanagi, MD  rOPINIRole (REQUIP) 2 MG tablet Take 1 tablet (2 mg total) by mouth at bedtime. 05/02/18 07/31/18  Debbora Presto, NP    Family History Family History  Problem Relation Age of Onset  . Coronary artery disease Father 69       deceased- TIAs  . Stroke Father   . Hypertension  Mother 48       alive  . Deep vein thrombosis Mother   . Neuropathy Mother   . Osteoporosis Mother   . Liver disease Sister   . Other Sister        ITP  . Cancer Sister        breast    Social History Social History   Tobacco Use  . Smoking status: Never Smoker  . Smokeless tobacco: Never Used  Substance Use Topics  . Alcohol use: No    Alcohol/week: 0.0 standard drinks  . Drug use: No     Allergies   Codeine and Diclofenac sodium   Review of Systems Review of Systems  All other systems reviewed and are negative.    Physical Exam Updated Vital Signs BP (!) 163/74 (BP Location: Right Arm)   Pulse (!) 106   Temp 99 F (37.2 C) (Oral)   Resp 18   Ht 5\' 4"  (1.626 m)   Wt 116.6 kg   SpO2 95%   BMI 44.11 kg/m   Physical Exam Vitals signs and nursing note reviewed.    63 year old female, resting comfortably and in no acute distress. Vital signs are significant for elevated blood pressure and mildly elevated heart rate. Oxygen saturation is 95%, which is normal. Head is normocephalic and atraumatic. PERRLA, EOMI. Oropharynx is clear. Neck is nontender and supple without adenopathy or JVD. Back is nontender and there is no CVA tenderness. Lungs are clear without rales, wheezes, or rhonchi. Chest is nontender. Heart has regular rate and rhythm without murmur. Abdomen is soft, flat, nontender without masses or hepatosplenomegaly and peristalsis is normoactive. Extremities: There is 1+ pretibial edema bilaterally, 2+ pedal edema on the left.  Left foot and lower leg are erythematous and slightly warm to the touch.  There are no cords palpable.  No lymphangitic streaks are seen. Skin is warm and dry without other rash. Neurologic: Mental status is normal, cranial nerves are intact, there are no motor or sensory deficits.  ED Treatments / Results  Labs (all labs ordered are listed, but only abnormal results are displayed) Labs Reviewed  BASIC METABOLIC PANEL -  Abnormal; Notable for the following components:      Result Value   Potassium 3.0 (*)    Glucose, Bld 123 (*)    Creatinine, Ser 1.18 (*)    GFR calc non Af Amer 48 (*)    GFR calc Af Amer 56 (*)    Anion gap 16 (*)    All other components within normal limits  CBC WITH DIFFERENTIAL/PLATELET -  Abnormal; Notable for the following components:   RDW 17.0 (*)    All other components within normal limits    Procedures Procedures  Medications Ordered in ED Medications  potassium chloride SA (K-DUR) CR tablet 40 mEq (has no administration in time range)  ceFAZolin (ANCEF) IVPB 2g/100 mL premix (0 g Intravenous Stopped 08/02/18 0159)     Initial Impression / Assessment and Plan / ED Course  I have reviewed the triage vital signs and the nursing notes.  Pertinent lab results that were available during my care of the patient were reviewed by me and considered in my medical decision making (see chart for details).  Cellulitis of the left lower leg.  Will check screening labs and give dose of cefazolin.  She is nontoxic in appearance, and if labs are reasonable, anticipate continuing treatment as an outpatient.  Old records are reviewed, and she has no relevant past visits.  Labs are reassuring.  Hypokalemia is noted and likely related to hydrochlorothiazide she takes for her blood pressure.  She is given a dose of oral potassium.  She is discharged with prescription for cephalexin and K-Dur, told to discontinue amoxicillin-clavulanic acid.  She is to follow-up with her PCP in 1 week.  Return precautions discussed.  Final Clinical Impressions(s) / ED Diagnoses   Final diagnoses:  Cellulitis of left foot  Diuretic-induced hypokalemia    ED Discharge Orders         Ordered    potassium chloride SA (K-DUR) 20 MEQ tablet  Daily     08/02/18 0228    cephALEXin (KEFLEX) 500 MG capsule  3 times daily     08/02/18 5053           Delora Fuel, MD 97/67/34 0230

## 2018-08-02 NOTE — Discharge Instructions (Addendum)
Return if symptoms are getting worse. °

## 2018-08-08 ENCOUNTER — Other Ambulatory Visit: Payer: Self-pay | Admitting: Podiatry

## 2018-08-08 ENCOUNTER — Ambulatory Visit: Payer: Medicare Other | Admitting: Podiatry

## 2018-08-08 ENCOUNTER — Ambulatory Visit (INDEPENDENT_AMBULATORY_CARE_PROVIDER_SITE_OTHER): Payer: Medicare Other

## 2018-08-08 ENCOUNTER — Encounter: Payer: Self-pay | Admitting: Podiatry

## 2018-08-08 ENCOUNTER — Other Ambulatory Visit: Payer: Self-pay

## 2018-08-08 VITALS — Temp 97.3°F

## 2018-08-08 DIAGNOSIS — L97522 Non-pressure chronic ulcer of other part of left foot with fat layer exposed: Secondary | ICD-10-CM | POA: Diagnosis not present

## 2018-08-08 DIAGNOSIS — L03116 Cellulitis of left lower limb: Secondary | ICD-10-CM

## 2018-08-08 DIAGNOSIS — M79672 Pain in left foot: Secondary | ICD-10-CM

## 2018-08-08 DIAGNOSIS — G629 Polyneuropathy, unspecified: Secondary | ICD-10-CM | POA: Diagnosis not present

## 2018-08-08 MED ORDER — GENTAMICIN SULFATE 0.1 % EX CREA: 1.0000 "application " | TOPICAL_CREAM | Freq: Two times a day (BID) | CUTANEOUS | 1 refills | Status: DC

## 2018-08-11 NOTE — Progress Notes (Signed)
   HPI: 63 year old female presenting today with a chief complaint of pain and swelling of the left foot that began about two weeks ago. She states she was seen by her PCP one week ago and was prescribed Augmentin for infection of the foot. She notes the symptoms are located in the lateral aspect and ball of the foot. Applying pressure increases the pain. She has been taking the antibiotics as directed. Patient is here for further evaluation and treatment.   Past Medical History:  Diagnosis Date  . Allergic rhinitis   . Arthritis   . CKD (chronic kidney disease), stage III ([place])   . Depression   . Factor V Leiden carrier ([place]) 08/26/2011  . Fatty liver   . Fibroids    dysfuction uterine bleeding  . GERD (gastroesophageal reflux disease)   . Hypertension   . Osteoarthritis   . Peripheral neuropathy   . Protein S deficiency ([place]) 08/26/2011  . Restless leg syndrome   . RLS (restless legs syndrome)      Physical Exam: General: The patient is alert and oriented x3 in no acute distress.  Dermatology: Skin is warm, dry and supple bilateral lower extremities.  Wound #1 noted to the sub-fifth MPJ of the left foot measuring 1.0 x 1.0 x 0.3 cm.  To the above-noted ulceration, there is no eschar. There is a moderate amount of slough, fibrin and necrotic tissue. Granulation tissue and wound base is red. There is no malodor. There is a minimal amount of serosanginous drainage noted. Periwound integrity is intact.   Vascular: Palpable pedal pulses bilaterally. Capillary refill within normal limits.  Neurological: Epicritic and protective threshold diminished bilaterally.   Musculoskeletal Exam: Erythema and edema localized to the left forefoot. Range of motion within normal limits to all pedal and ankle joints bilateral. Muscle strength 5/5 in all groups bilateral.   Radiographic Exam:  Normal osseous mineralization. Joint spaces preserved. No fracture/dislocation/boney destruction.     Assessment: 1. Ulceration of the sub-fifth MPJ left 2. Idiopathic peripheral neuropathy bilateral 3. Cellulitis left foot    Plan of Care:  1. Patient evaluated. X-Rays reviewed.  2. Medically necessary excisional debridement including subcutaneous tissue was performed using a tissue nipper and a chisel blade. Excisional debridement of all the necrotic nonviable tissue down to healthy bleeding viable tissue was performed with post-debridement measurements same as pre-. 3. The wound was cleansed and dry sterile dressing applied. 4. Continue taking Augmentin as directed by PCP.  5. Prescription for Gentamicin cream provided to patient to use daily with a bandage.  6. Post op shoe dispensed.  7. Return to clinic in 2 weeks.     Edrick Kins, DPM [facility]  Dr. Edrick Kins, DPM    [address], [place] [address]                Office [phone]  Fax [phone]

## 2018-08-14 ENCOUNTER — Encounter: Payer: Self-pay | Admitting: Podiatry

## 2018-08-16 ENCOUNTER — Telehealth: Payer: Self-pay | Admitting: Podiatry

## 2018-08-16 NOTE — Telephone Encounter (Signed)
I called [name] [name] to offer her an appointment with Dr. Amalia Hailey on 08/17/2018 at 11:45 and she stated that she couldn't come in because she has another appointment that day. She will come in on her scheduled appointment 08/24/2018.

## 2018-08-24 ENCOUNTER — Other Ambulatory Visit: Payer: Self-pay

## 2018-08-24 ENCOUNTER — Ambulatory Visit: Payer: Medicare Other | Admitting: Podiatry

## 2018-08-24 ENCOUNTER — Encounter: Payer: Self-pay | Admitting: Podiatry

## 2018-08-24 VITALS — Temp 97.7°F

## 2018-08-24 DIAGNOSIS — L97522 Non-pressure chronic ulcer of other part of left foot with fat layer exposed: Secondary | ICD-10-CM

## 2018-08-24 DIAGNOSIS — L03116 Cellulitis of left lower limb: Secondary | ICD-10-CM

## 2018-08-24 DIAGNOSIS — G629 Polyneuropathy, unspecified: Secondary | ICD-10-CM

## 2018-08-24 MED ORDER — DOXYCYCLINE HYCLATE 100 MG PO TABS: 100.0000 mg | ORAL_TABLET | Freq: Two times a day (BID) | ORAL | 0 refills | Status: DC

## 2018-08-28 NOTE — Progress Notes (Signed)
   HPI: 63 year old female presenting today for follow up evaluation of an ulceration of the sub-fifth MPJ of the left foot. She reports some continued drainage from the wound. She recently started taking Augementin prescribed by her PCP.  She also notes painful callus lesions bilaterally. Walking and bearing weight increases the pain. She has not done anything at home for treatment. Patient is here for further evaluation and treatment.   Past Medical History:  Diagnosis Date  . Allergic rhinitis   . Arthritis   . CKD (chronic kidney disease), stage III ([place])   . Depression   . Factor V Leiden carrier ([place]) 08/26/2011  . Fatty liver   . Fibroids    dysfuction uterine bleeding  . GERD (gastroesophageal reflux disease)   . Hypertension   . Osteoarthritis   . Peripheral neuropathy   . Protein S deficiency ([place]) 08/26/2011  . Restless leg syndrome   . RLS (restless legs syndrome)      Physical Exam: General: The patient is alert and oriented x3 in no acute distress.  Dermatology:   Wound #1 noted to the sub-fifth MPJ of the left foot measuring 0.6 x 0.6 x 0.3 cm.  Wound #2 noted to the sub-first MPJ of the right foot measuring 0.6 x 0.2 x 0.1 cm.  To the above-noted ulceration, there is no eschar. There is a moderate amount of slough, fibrin and necrotic tissue. Granulation tissue and wound base is red. There is no malodor. There is a minimal amount of serosanginous drainage noted. Periwound integrity is intact.  Skin is warm, dry and supple bilateral lower extremities.  Vascular: Palpable pedal pulses bilaterally. Capillary refill within normal limits.  Neurological: Epicritic and protective threshold diminished bilaterally.   Musculoskeletal Exam: Clinical evidence of Tailor's bunion deformity noted to the respective foot. There is a moderate pain on palpation range of motion of the fifth MPJ. Range of motion within normal limits to all pedal and ankle joints bilateral. Muscle  strength 5/5 in all groups bilateral.   Assessment: 1. Ulceration of the sub-fifth MPJ left 2. Idiopathic peripheral neuropathy bilateral 3. Cellulitis left foot - resolved  4. Tailor's bunion left   Plan of Care:  1. Patient evaluated.   2. Medically necessary excisional debridement including subcutaneous tissue was performed using a tissue nipper and a chisel blade. Excisional debridement of all the necrotic nonviable tissue down to healthy bleeding viable tissue was performed with post-debridement measurements same as pre-. 3. The wound was cleansed and dry sterile dressing applied. 4. Continue using Gentamicin cream daily with a bandage.  5. Continue using post op shoe.  6. Culture taken from left foot ulcer.  7. Prescription for Doxycycline #20 provided to patient. Patient recently finished Augmentin from PCP.  8. Patient will require Tailor's bunionectomy surgery for a more permanent solution once infection has resolved.  9. Return to clinic in 3 weeks.    Edrick Kins, DPM [facility]  Dr. Edrick Kins, DPM    [address], [place] [address]                Office [phone]  Fax [phone]

## 2018-08-29 LAB — WOUND CULTURE
MICRO NUMBER:: 510122
RESULT:: NO GROWTH
SPECIMEN QUALITY:: ADEQUATE

## 2018-09-02 ENCOUNTER — Other Ambulatory Visit: Payer: Self-pay

## 2018-09-02 ENCOUNTER — Ambulatory Visit (INDEPENDENT_AMBULATORY_CARE_PROVIDER_SITE_OTHER): Payer: Medicare Other | Admitting: Orthopaedic Surgery

## 2018-09-02 ENCOUNTER — Encounter: Payer: Self-pay | Admitting: Orthopaedic Surgery

## 2018-09-02 DIAGNOSIS — M25511 Pain in right shoulder: Secondary | ICD-10-CM | POA: Diagnosis not present

## 2018-09-02 MED ORDER — TRAMADOL HCL 50 MG PO TABS: 50.0000 mg | ORAL_TABLET | Freq: Every day | ORAL | 2 refills | Status: DC | PRN

## 2018-09-02 NOTE — Addendum Note (Signed)
Addended by: Precious Bard on: 09/02/2018 10:06 AM   Modules accepted: Orders

## 2018-09-02 NOTE — Progress Notes (Signed)
Office Visit Note   Patient: [name] [name]           Date of Birth: Aug 21, 1955           MRN: [id number] Visit Date: 09/02/2018              Requested by: Harlan Stains, MD [name], [name] [address] PCP: Harlan Stains, MD   Assessment & Plan: Visit Diagnoses:  1. Acute pain of right shoulder     Plan: Impression is chronic right shoulder pain suspect rotator cuff tendinosis and partial thickness tear.  We will obtain MRI to fully evaluate this.  Follow-up after the MRI.  Prescription for tramadol.  Follow-Up Instructions: Return in about 10 days (around 09/12/2018).   Orders:  No orders of the defined types were placed in this encounter.  Meds ordered this encounter  Medications  . traMADol (ULTRAM) 50 MG tablet    Sig: Take 1-2 tablets (50-100 mg total) by mouth daily as needed.    Dispense:  30 tablet    Refill:  2      Procedures: No procedures performed   Clinical Data: No additional findings.   Subjective: Chief Complaint  Patient presents with  . Right Shoulder - Pain, Follow-up    [name] [name] comes in for follow-up of her right shoulder pain.  The injection she had a month ago helped about 50%.  She continues to have pain in her right shoulder region that is worse with elevation and with lowering the arm.   Review of Systems  Constitutional: Negative.   HENT: Negative.   Eyes: Negative.   Respiratory: Negative.   Cardiovascular: Negative.   Endocrine: Negative.   Musculoskeletal: Negative.   Neurological: Negative.   Hematological: Negative.   Psychiatric/Behavioral: Negative.   All other systems reviewed and are negative.    Objective: Vital Signs: There were no vitals taken for this visit.  Physical Exam Vitals signs and nursing note reviewed.  Constitutional:      Appearance: She is well-developed.  Pulmonary:     Effort: Pulmonary effort is normal.  Skin:    General: Skin is warm.     Capillary  Refill: Capillary refill takes less than 2 seconds.  Neurological:     Mental Status: She is alert and oriented to person, place, and time.  Psychiatric:        Behavior: Behavior normal.        Thought Content: Thought content normal.        Judgment: Judgment normal.     Ortho Exam Right shoulder exam shows a positive drop arm test.  Pain with empty can testing.  Positive impingement. Specialty Comments:  No specialty comments available.  Imaging: No results found.   PMFS History: Patient Active Problem List   Diagnosis Date Noted  . Fibromyalgia 11/11/2016  . Atrial septal hypertrophy 10/21/2015  . Paresthesia of both feet 07/31/2015  . Bradycardia 08/14/2014  . Obesity 10/09/2013  . Chest pain 10/08/2013  . Factor V Leiden carrier ([place]) 08/26/2011  . Protein S deficiency ([place]) 08/26/2011  . MORBID OBESITY 06/06/2010  . DEPRESSION 06/06/2010  . RESTLESS LEG SYNDROME 06/06/2010  . Essential hypertension 06/06/2010  . ALLERGIC RHINITIS 06/06/2010  . GERD 06/06/2010  . FATTY LIVER DISEASE 06/06/2010  . OSTEOARTHRITIS 06/06/2010  . MYOFASCIAL PAIN SYNDROME 06/06/2010  . EDEMA 06/06/2010  . DYSPNEA 06/06/2010  . CHEST PAIN UNSPECIFIED 06/06/2010   Past Medical History:  Diagnosis Date  .  Allergic rhinitis   . Arthritis   . CKD (chronic kidney disease), stage III ([place])   . Depression   . Factor V Leiden carrier ([place]) 08/26/2011  . Fatty liver   . Fibroids    dysfuction uterine bleeding  . GERD (gastroesophageal reflux disease)   . Hypertension   . Osteoarthritis   . Peripheral neuropathy   . Protein S deficiency ([place]) 08/26/2011  . Restless leg syndrome   . RLS (restless legs syndrome)     Family History  Problem Relation Age of Onset  . Coronary artery disease Father 75       deceased- TIAs  . Stroke Father   . Hypertension Mother 46       alive  . Deep vein thrombosis Mother   . Neuropathy Mother   . Osteoporosis Mother   . Liver disease Sister   .  Other Sister        ITP  . Cancer Sister        breast    Past Surgical History:  Procedure Laterality Date  . CHOLECYSTECTOMY    . KNEE SURGERY     right TKR  . LUMBAR SPINE SURGERY    . rectu    . TUBAL LIGATION     bilateral  . URETHRAL SLING     Social History   Occupational History  . 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 Smoking status: Never Smoker  . Smokeless tobacco: Never Used  Substance and Sexual Activity  . Alcohol use: No    Alcohol/week: 0.0 standard drinks  . Drug use: No  . Sexual activity: Not on file

## 2018-09-13 ENCOUNTER — Ambulatory Visit: Payer: Medicare Other | Admitting: Orthopaedic Surgery

## 2018-09-14 ENCOUNTER — Encounter: Payer: Self-pay | Admitting: Podiatry

## 2018-09-14 ENCOUNTER — Other Ambulatory Visit: Payer: Self-pay

## 2018-09-14 ENCOUNTER — Ambulatory Visit: Payer: Medicare Other | Admitting: Podiatry

## 2018-09-14 VITALS — Temp 97.9°F

## 2018-09-14 DIAGNOSIS — L03116 Cellulitis of left lower limb: Secondary | ICD-10-CM

## 2018-09-14 DIAGNOSIS — L989 Disorder of the skin and subcutaneous tissue, unspecified: Secondary | ICD-10-CM

## 2018-09-14 DIAGNOSIS — G629 Polyneuropathy, unspecified: Secondary | ICD-10-CM

## 2018-09-14 DIAGNOSIS — L97522 Non-pressure chronic ulcer of other part of left foot with fat layer exposed: Secondary | ICD-10-CM

## 2018-09-14 DIAGNOSIS — B353 Tinea pedis: Secondary | ICD-10-CM

## 2018-09-14 DIAGNOSIS — Q828 Other specified congenital malformations of skin: Secondary | ICD-10-CM

## 2018-09-14 MED ORDER — CLOTRIMAZOLE-BETAMETHASONE 1-0.05 % EX CREA: 1.0000 "application " | TOPICAL_CREAM | Freq: Two times a day (BID) | CUTANEOUS | 1 refills | Status: DC

## 2018-09-14 MED ORDER — TERBINAFINE HCL 250 MG PO TABS: 250.0000 mg | ORAL_TABLET | Freq: Every day | ORAL | 0 refills | Status: DC

## 2018-09-17 NOTE — Progress Notes (Signed)
HPI: 63 year old female presenting today for follow up evaluation of ulcerations noted to bilateral feet. She states she is doing well and applying Gentamicin twice daily as directed. There are no modifying factors noted.  She has a new complaint of peeling skin noted to the bilateral feet that began about one month ago. She denies any itching and has not done anything for treatment. She denies modifying factors. Patient is here for further evaluation and treatment.   Past Medical History:  Diagnosis Date  . Allergic rhinitis   . Arthritis   . CKD (chronic kidney disease), stage III ([place])   . Depression   . Factor V Leiden carrier ([place]) 08/26/2011  . Fatty liver   . Fibroids    dysfuction uterine bleeding  . GERD (gastroesophageal reflux disease)   . Hypertension   . Osteoarthritis   . Peripheral neuropathy   . Protein S deficiency ([place]) 08/26/2011  . Restless leg syndrome   . RLS (restless legs syndrome)      Physical Exam: General: The patient is alert and oriented x3 in no acute distress.  Dermatology:   Wound #1 noted to the sub-fifth MPJ of the left foot measuring 0.6 x 0.6 x 0.2 cm.  Wound #2 noted to the sub-first MPJ of the right foot measuring 0.6 x 0.6 x 0.2 cm.  To the above-noted ulceration, there is no eschar. There is a moderate amount of slough, fibrin and necrotic tissue. Granulation tissue and wound base is red. There is no malodor. There is a minimal amount of serosanginous drainage noted. Periwound integrity is intact.  Hyperkeratosis noted to the forefoot of the bilateral feet.  Hyperkeratotic lesion present on the left foot. Pain on palpation with a central nucleated core noted.  Skin is warm, dry and supple bilateral lower extremities.  Vascular: Palpable pedal pulses bilaterally. Capillary refill within normal limits.  Neurological: Epicritic and protective threshold diminished bilaterally.   Musculoskeletal Exam: Clinical evidence of Tailor's bunion  deformity noted to the respective foot. There is a moderate pain on palpation range of motion of the fifth MPJ. Range of motion within normal limits to all pedal and ankle joints bilateral. Muscle strength 5/5 in all groups bilateral.   Assessment: 1. Ulceration of the sub-fifth MPJ left 2. Idiopathic peripheral neuropathy bilateral 3. Tailor's bunion left 4. Tinea pedis bilateral forefoot  5. Porokeratosis left    Plan of Care:  1. Patient evaluated.   2. Medically necessary excisional debridement including subcutaneous tissue was performed using a tissue nipper and a chisel blade. Excisional debridement of all the necrotic nonviable tissue down to healthy bleeding viable tissue was performed with post-debridement measurements same as pre-. 3. The wound was cleansed and dry sterile dressing applied. 4. Continue using Gentamicin cream twice daily.  5. Continue using post op shoe on the left foot.  6. Prescription for Lamisil 250 mg #28 provided to patient.  7. Prescription for Lotrisone for daily use provided to patient.  8. Excisional debridement of keratotic lesion using a chisel blade was performed without incident. Light dressing applied.  9. Return to clinic in 4 weeks.     Edrick Kins, DPM [facility]  Dr. Edrick Kins, DPM    [address]  [place], [place] [address]                Office [phone]  Fax [phone]

## 2018-09-22 ENCOUNTER — Ambulatory Visit
Admission: RE | Admit: 2018-09-22 | Discharge: 2018-09-22 | Disposition: A | Discharge status: Home / Self Care | Payer: Medicare Other | Source: Ambulatory Visit | Attending: Orthopaedic Surgery | Admitting: Orthopaedic Surgery

## 2018-09-22 DIAGNOSIS — M25511 Pain in right shoulder: Secondary | ICD-10-CM

## 2018-09-28 ENCOUNTER — Encounter: Payer: Self-pay | Admitting: Orthopaedic Surgery

## 2018-09-28 ENCOUNTER — Ambulatory Visit (INDEPENDENT_AMBULATORY_CARE_PROVIDER_SITE_OTHER): Payer: Medicare Other | Admitting: Orthopaedic Surgery

## 2018-09-28 ENCOUNTER — Other Ambulatory Visit: Payer: Self-pay

## 2018-09-28 ENCOUNTER — Ambulatory Visit: Payer: Medicare Other | Admitting: Podiatry

## 2018-09-28 DIAGNOSIS — M7541 Impingement syndrome of right shoulder: Secondary | ICD-10-CM

## 2018-09-28 DIAGNOSIS — M67921 Unspecified disorder of synovium and tendon, right upper arm: Secondary | ICD-10-CM | POA: Diagnosis not present

## 2018-09-28 DIAGNOSIS — M75101 Unspecified rotator cuff tear or rupture of right shoulder, not specified as traumatic: Secondary | ICD-10-CM

## 2018-09-28 NOTE — Progress Notes (Signed)
Office Visit Note   Patient: [name] [name]           Date of Birth: 11-23-1955           MRN: [id number] Visit Date: 09/28/2018              Requested by: Harlan Stains, MD [address] [place],  [place] [address] PCP: Harlan Stains, MD   Assessment & Plan: Visit Diagnoses:  1. Nontraumatic tear of right supraspinatus tendon   2. Impingement syndrome of right shoulder   3. Tendinopathy of right biceps tendon     Plan: Impression is full-thickness tear of the supraspinatus towards the anterior portion.  Her function is well-preserved in that regard.  She does have popping which I think is related to her subscap tendinopathy and the medial dislocation of her biceps.  She has not had significant relief from conservative treatment in the form of injections and physical therapy.  Fortunately her subscap is well compensated so I do not feel that this needs to be repaired but rather just debrided during the time of surgery.  Unfortunately she does have a Wagner stage I ulcer on the lateral aspect of her left foot which podiatry is treating.  This is not infected but does penetrate deeper than the dermis.  Therefore I would like to have this healed up a little bit more before we proceed with shoulder arthroscopy and rotator cuff repair.  She will see Dr. Amalia Hailey in the near future for continued management of the foot wound.  She may also consider seeing the wound center about this.  I have asked her to contact us back once the wound is healed so that we can safely proceed with shoulder surgery.  Patient is in agreement with the plan.  Questions encouraged and answered.  Follow-Up Instructions: Return if symptoms worsen or fail to improve.   Orders:  No orders of the defined types were placed in this encounter.  No orders of the defined types were placed in this encounter.     Procedures: No procedures performed   Clinical Data: No additional findings.   Subjective:  Chief Complaint  Patient presents with  . Right Shoulder - Follow-up    MRI Review    [name] [name] comes in today to review her right shoulder MRI.  She does have psoriatic arthritis for which she takes Enbrel but she has not been taking Enbrel for at least a month now due to the small wound on her left foot that Dr. Amalia Hailey of podiatry is treating.  In terms of her right shoulder she has trouble with putting dishes in the cupboard and raising her hand above her head and doing any sort of overhead activities.  She feels popping in her shoulder when she lowers her arm.   Review of Systems  Constitutional: Negative.   HENT: Negative.   Eyes: Negative.   Respiratory: Negative.   Cardiovascular: Negative.   Endocrine: Negative.   Musculoskeletal: Negative.   Neurological: Negative.   Hematological: Negative.   Psychiatric/Behavioral: Negative.   All other systems reviewed and are negative.    Objective: Vital Signs: There were no vitals taken for this visit.  Physical Exam Vitals signs and nursing note reviewed.  Constitutional:      Appearance: She is well-developed.  Pulmonary:     Effort: Pulmonary effort is normal.  Skin:    General: Skin is warm.     Capillary Refill: Capillary refill takes less  than 2 seconds.  Neurological:     Mental Status: She is alert and oriented to person, place, and time.  Psychiatric:        Behavior: Behavior normal.        Thought Content: Thought content normal.        Judgment: Judgment normal.     Ortho Exam Right shoulder exam shows nearly normal strength with forward flexion and abduction.  She does have pain with empty can testing.  Belly press and bearhug show mild weakness secondary to pain.  Pain with Hawkins impingement.  Negative Neer impingement.  Negative cross adduction body sign. Specialty Comments:  No specialty comments available.  Imaging: No results found.   PMFS History: Patient Active Problem List   Diagnosis Date  Noted  . Fibromyalgia 11/11/2016  . Atrial septal hypertrophy 10/21/2015  . Paresthesia of both feet 07/31/2015  . Bradycardia 08/14/2014  . Obesity 10/09/2013  . Chest pain 10/08/2013  . Factor V Leiden carrier ([place]) 08/26/2011  . Protein S deficiency ([place]) 08/26/2011  . MORBID OBESITY 06/06/2010  . DEPRESSION 06/06/2010  . RESTLESS LEG SYNDROME 06/06/2010  . Essential hypertension 06/06/2010  . ALLERGIC RHINITIS 06/06/2010  . GERD 06/06/2010  . FATTY LIVER DISEASE 06/06/2010  . OSTEOARTHRITIS 06/06/2010  . MYOFASCIAL PAIN SYNDROME 06/06/2010  . EDEMA 06/06/2010  . DYSPNEA 06/06/2010  . CHEST PAIN UNSPECIFIED 06/06/2010   Past Medical History:  Diagnosis Date  . Allergic rhinitis   . Arthritis   . CKD (chronic kidney disease), stage III ([place])   . Depression   . Factor V Leiden carrier ([place]) 08/26/2011  . Fatty liver   . Fibroids    dysfuction uterine bleeding  . GERD (gastroesophageal reflux disease)   . Hypertension   . Osteoarthritis   . Peripheral neuropathy   . Protein S deficiency ([place]) 08/26/2011  . Restless leg syndrome   . RLS (restless legs syndrome)     Family History  Problem Relation Age of Onset  . Coronary artery disease Father 32       deceased- TIAs  . Stroke Father   . Hypertension Mother 74       alive  . Deep vein thrombosis Mother   . Neuropathy Mother   . Osteoporosis Mother   . Liver disease Sister   . Other Sister        ITP  . Cancer Sister        breast    Past Surgical History:  Procedure Laterality Date  . CHOLECYSTECTOMY    . KNEE SURGERY     right TKR  . LUMBAR SPINE SURGERY    . rectu    . TUBAL LIGATION     bilateral  . URETHRAL SLING     Social History   Occupational History  . 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 Smoking status: Never Smoker  . Smokeless tobacco: Never Used  Substance and Sexual Activity  . Alcohol use: No    Alcohol/week: 0.0 standard drinks  . Drug  use: No  . Sexual activity: Not on file

## 2018-10-12 ENCOUNTER — Encounter: Payer: Self-pay | Admitting: Podiatry

## 2018-10-12 ENCOUNTER — Ambulatory Visit (INDEPENDENT_AMBULATORY_CARE_PROVIDER_SITE_OTHER): Payer: Medicare Other

## 2018-10-12 ENCOUNTER — Telehealth: Payer: Self-pay | Admitting: *Deleted

## 2018-10-12 ENCOUNTER — Ambulatory Visit: Payer: Medicare Other | Admitting: Podiatry

## 2018-10-12 ENCOUNTER — Other Ambulatory Visit: Payer: Self-pay

## 2018-10-12 VITALS — Temp 97.7°F

## 2018-10-12 DIAGNOSIS — G629 Polyneuropathy, unspecified: Secondary | ICD-10-CM

## 2018-10-12 DIAGNOSIS — L03116 Cellulitis of left lower limb: Secondary | ICD-10-CM

## 2018-10-12 DIAGNOSIS — L97521 Non-pressure chronic ulcer of other part of left foot limited to breakdown of skin: Secondary | ICD-10-CM

## 2018-10-12 DIAGNOSIS — L97522 Non-pressure chronic ulcer of other part of left foot with fat layer exposed: Secondary | ICD-10-CM

## 2018-10-12 MED ORDER — DOXYCYCLINE HYCLATE 100 MG PO TABS: 100.0000 mg | ORAL_TABLET | Freq: Two times a day (BID) | ORAL | 0 refills | Status: DC

## 2018-10-12 NOTE — Telephone Encounter (Signed)
Faxed orders to [facility]. 

## 2018-10-12 NOTE — Telephone Encounter (Signed)
-----   Message from Edrick Kins, DPM sent at 10/12/2018 12:06 PM EDT ----- Regarding: Aquacel Ag Please order Aquacel Ag for patient.  Left foot ulcer 1.0x1.0x0.3cm  - Daily x 4 weeks.   Thanks, Dr. Amalia Hailey

## 2018-10-15 LAB — WOUND CULTURE
MICRO NUMBER:: 669908
SPECIMEN QUALITY:: ADEQUATE

## 2018-10-15 NOTE — Progress Notes (Signed)
HPI: 63 year old female presenting today for follow up evaluation of ulcerations noted to bilateral feet. She states the wounds have not changed since her last visit. She has been using Gentamicin cream as directed. There are no modifying factors noted. She states her symptoms of the bilateral forefoot have improved. She has been using the Lotrisone cream and taking the Lamisil as directed which have helped resolve the issues. Patient is here for further evaluation and treatment.   Past Medical History:  Diagnosis Date  . Allergic rhinitis   . Arthritis   . CKD (chronic kidney disease), stage III ([place])   . Depression   . Factor V Leiden carrier ([place]) 08/26/2011  . Fatty liver   . Fibroids    dysfuction uterine bleeding  . GERD (gastroesophageal reflux disease)   . Hypertension   . Osteoarthritis   . Peripheral neuropathy   . Protein S deficiency ([place]) 08/26/2011  . Restless leg syndrome   . RLS (restless legs syndrome)      Physical Exam: General: The patient is alert and oriented x3 in no acute distress.  Dermatology:   Wound #1 noted to the sub-fifth MPJ of the left foot measuring 0.6 x 0.6 x 0.2 cm.  Wound #2 noted to the sub-first MPJ of the right foot measuring 0.6 x 0.6 x 0.2 cm.  To the above-noted ulceration, there is no eschar. There is a moderate amount of slough, fibrin and necrotic tissue. Granulation tissue and wound base is red. There is no malodor. There is a minimal amount of serosanginous drainage noted. Periwound integrity is intact.  Hyperkeratotic lesion present on the left foot. Pain on palpation with a central nucleated core noted.  Skin is warm, dry and supple bilateral lower extremities.  Vascular: Palpable pedal pulses bilaterally. Capillary refill within normal limits.  Neurological: Epicritic and protective threshold diminished bilaterally.   Musculoskeletal Exam: Clinical evidence of Tailor's bunion deformity noted to the respective foot. There is a  moderate pain on palpation range of motion of the fifth MPJ. Range of motion within normal limits to all pedal and ankle joints bilateral. Muscle strength 5/5 in all groups bilateral.   Radiographic Exam: No change since prior exam.  Assessment: 1. Ulceration of the sub-fifth MPJ left 2. Idiopathic peripheral neuropathy bilateral 3. Tailor's bunion left 4. Porokeratosis left  5. Ulceration of the sub-first MPJ right     Plan of Care:  1. Patient evaluated. X-Rays reviewed.  2. Medically necessary excisional debridement including subcutaneous tissue was performed using a tissue nipper and a chisel blade. Excisional debridement of all the necrotic nonviable tissue down to healthy bleeding viable tissue was performed with post-debridement measurements same as pre-. 3. The wound was cleansed and dry sterile dressing applied. 4. Culture taken from wound.  5. Prescription for Doxycycline 100 mg #20 provided to patient.  6. Orders placed for Aquacel Ag.   7. Continue using post op shoe.  8. Return to clinic in 3 weeks.    Edrick Kins, DPM [facility]  Dr. Edrick Kins, DPM    [address], [place] [address]                Office [phone]  Fax [phone]

## 2018-10-19 ENCOUNTER — Telehealth: Payer: Self-pay | Admitting: Podiatry

## 2018-10-19 NOTE — Telephone Encounter (Signed)
Would like the nurse to call her and give instructions on the wound dressing she received.

## 2018-10-20 ENCOUNTER — Telehealth: Payer: Self-pay

## 2018-10-20 NOTE — Telephone Encounter (Signed)
Spoke with patient advising her on use of super sore G wound care product.  Advised her to apply the product by removing Wh[place]ide applied to the wound, and remove the outer right side, applied dry sterile dressing and Covan or an Ace bandage.  She is to change this daily.

## 2018-10-24 ENCOUNTER — Encounter: Payer: Self-pay | Admitting: Podiatry

## 2018-10-26 ENCOUNTER — Telehealth: Payer: Self-pay | Admitting: *Deleted

## 2018-10-26 NOTE — Telephone Encounter (Signed)
I informed pt I had reviewed her clinicals and orders, there were no new orders for x-rays, but it was noted at her last visit that Dr. Amalia Hailey had discussed her x-rays with her.

## 2018-10-26 NOTE — Telephone Encounter (Signed)
Pt states she checked her Visit Summary and it said something about x-ray at the hospital.

## 2018-11-07 ENCOUNTER — Ambulatory Visit (INDEPENDENT_AMBULATORY_CARE_PROVIDER_SITE_OTHER): Payer: Medicare Other | Admitting: Podiatry

## 2018-11-07 ENCOUNTER — Other Ambulatory Visit: Payer: Self-pay

## 2018-11-07 VITALS — Temp 98.0°F

## 2018-11-07 DIAGNOSIS — L97522 Non-pressure chronic ulcer of other part of left foot with fat layer exposed: Secondary | ICD-10-CM

## 2018-11-07 DIAGNOSIS — L03116 Cellulitis of left lower limb: Secondary | ICD-10-CM

## 2018-11-07 DIAGNOSIS — G629 Polyneuropathy, unspecified: Secondary | ICD-10-CM

## 2018-11-07 MED ORDER — TRAMADOL HCL 50 MG PO TABS: 50.0000 mg | ORAL_TABLET | Freq: Four times a day (QID) | ORAL | 0 refills | Status: DC | PRN

## 2018-11-09 NOTE — Progress Notes (Signed)
HPI: 63 year old female presenting today for follow up evaluation of ulcerations noted to bilateral feet, a Tailor's bunion and porokeratosis of the left foot as well as neuropathy. She states she feels her wounds are healing appropriately. She states the wound on the right foot is relatively healed. She has been using the gel pads and post op shoe as directed. There are no modifying factors noted. Patient is here for further evaluation and treatment.   Past Medical History:  Diagnosis Date  . Allergic rhinitis   . Arthritis   . CKD (chronic kidney disease), stage III ([place])   . Depression   . Factor V Leiden carrier ([place]) 08/26/2011  . Fatty liver   . Fibroids    dysfuction uterine bleeding  . GERD (gastroesophageal reflux disease)   . Hypertension   . Osteoarthritis   . Peripheral neuropathy   . Protein S deficiency ([place]) 08/26/2011  . Restless leg syndrome   . RLS (restless legs syndrome)      Physical Exam: General: The patient is alert and oriented x3 in no acute distress.  Dermatology:   Wound #1 noted to the sub-fifth MPJ of the left foot measuring 0.8 x 0.8 x 0.2 cm.  To the above-noted ulceration, there is no eschar. There is a moderate amount of slough, fibrin and necrotic tissue. Granulation tissue and wound base is red. There is no malodor. There is a minimal amount of serosanginous drainage noted. Periwound integrity is intact.  Hyperkeratotic lesion present on the left foot. Pain on palpation with a central nucleated core noted.  Skin is warm, dry and supple bilateral lower extremities.  Wound noted to the sub-fifth MPJ of the right foot has healed. Complete re-epithelialization has occurred. No drainage noted.   Vascular: Palpable pedal pulses bilaterally. Capillary refill within normal limits.  Neurological: Epicritic and protective threshold diminished bilaterally.   Musculoskeletal Exam: Clinical evidence of Tailor's bunion deformity noted to the respective  foot. There is a moderate pain on palpation range of motion of the fifth MPJ. Range of motion within normal limits to all pedal and ankle joints bilateral. Muscle strength 5/5 in all groups bilateral.   Assessment: 1. Ulceration of the sub-fifth MPJ left 2. Idiopathic peripheral neuropathy bilateral 3. Tailor's bunion left 4. Porokeratosis left  5. Ulceration of the sub-first MPJ right - healed     Plan of Care:  1. Patient evaluated.  2. Medically necessary excisional debridement including subcutaneous tissue was performed using a tissue nipper and a chisel blade. Excisional debridement of all the necrotic nonviable tissue down to healthy bleeding viable tissue was performed with post-debridement measurements same as pre-. 3. The wound was cleansed and dry sterile dressing applied. 4. Continue using home wound gel pads daily.  5. Continue using post op shoe.  6. Met pads dispensed.  7. Return to clinic in 3 weeks to discuss surgery if wound appears improved.     Edrick Kins, DPM [facility]  Dr. Edrick Kins, DPM    [address], [place] [address]                Office [phone]  Fax [phone]

## 2018-11-28 ENCOUNTER — Other Ambulatory Visit: Payer: Self-pay

## 2018-11-28 ENCOUNTER — Encounter: Payer: Self-pay | Admitting: Podiatry

## 2018-11-28 ENCOUNTER — Ambulatory Visit (INDEPENDENT_AMBULATORY_CARE_PROVIDER_SITE_OTHER): Payer: Medicare Other | Admitting: Podiatry

## 2018-11-28 ENCOUNTER — Telehealth: Payer: Self-pay | Admitting: *Deleted

## 2018-11-28 DIAGNOSIS — L97522 Non-pressure chronic ulcer of other part of left foot with fat layer exposed: Secondary | ICD-10-CM

## 2018-11-28 DIAGNOSIS — M21622 Bunionette of left foot: Secondary | ICD-10-CM

## 2018-11-28 DIAGNOSIS — G629 Polyneuropathy, unspecified: Secondary | ICD-10-CM

## 2018-11-28 NOTE — Patient Instructions (Signed)
Pre-Operative Instructions  Congratulations, you have decided to take an important step towards improving your quality of life.  You can be assured that the doctors and staff at [facility] will be with you every step of the way.  Here are some important things you should know:  1. Plan to be at the surgery center/hospital at least 1 (one) hour prior to your scheduled time, unless otherwise directed by the surgical center/hospital staff.  You must have a responsible adult accompany you, remain during the surgery and drive you home.  Make sure you have directions to the surgical center/hospital to ensure you arrive on time. 2. If you are having surgery at [facility] or [facility], you will need a copy of your medical history and physical form from your family physician within one month prior to the date of surgery. We will give you a form for your primary physician to complete.  3. We make every effort to accommodate the date you request for surgery.  However, there are times where surgery dates or times have to be moved.  We will contact you as soon as possible if a change in schedule is required.   4. No aspirin/ibuprofen for one week before surgery.  If you are on aspirin, any non-steroidal anti-inflammatory medications (Mobic, Aleve, Ibuprofen) should not be taken seven (7) days prior to your surgery.  You make take Tylenol for pain prior to surgery.  5. Medications - If you are taking daily heart and blood pressure medications, seizure, reflux, allergy, asthma, anxiety, pain or diabetes medications, make sure you notify the surgery center/hospital before the day of surgery so they can tell you which medications you should take or avoid the day of surgery. 6. No food or drink after midnight the night before surgery unless directed otherwise by surgical center/hospital staff. 7. No alcoholic beverages 24-hours prior to surgery.  No smoking 24-hours prior or 24-hours after  surgery. 8. Wear loose pants or shorts. They should be loose enough to fit over bandages, boots, and casts. 9. Don't wear slip-on shoes. Sneakers are preferred. 10. Bring your boot with you to the surgery center/hospital.  Also bring crutches or a walker if your physician has prescribed it for you.  If you do not have this equipment, it will be provided for you after surgery. 11. If you have not been contacted by the surgery center/hospital by the day before your surgery, call to confirm the date and time of your surgery. 12. Leave-time from work may vary depending on the type of surgery you have.  Appropriate arrangements should be made prior to surgery with your employer. 13. Prescriptions will be provided immediately following surgery by your doctor.  Fill these as soon as possible after surgery and take the medication as directed. Pain medications will not be refilled on weekends and must be approved by the doctor. 14. Remove nail polish on the operative foot and avoid getting pedicures prior to surgery. 15. Wash the night before surgery.  The night before surgery wash the foot and leg well with water and the antibacterial soap provided. Be sure to pay special attention to beneath the toenails and in between the toes.  Wash for at least three (3) minutes. Rinse thoroughly with water and dry well with a towel.  Perform this wash unless told not to do so by your physician.  Enclosed: 1 Ice pack (please put in freezer the night before surgery)   1 Hibiclens skin cleaner     Pre-op instructions  If you have any questions regarding the instructions, please do not hesitate to call our office.  [place]: [address], [place], [place] [address] -- [phone]  [place]: [address] [place], [place] [address] -- [phone]  [place]: [address]  [place], [place] [address] -- [phone]  [place]: [address], [address], [place], [place] [address] -- [phone]  Website:  [email or web address] 

## 2018-11-28 NOTE — Telephone Encounter (Signed)
-----   Message from Edrick Kins, DPM sent at 11/28/2018 10:48 AM EDT ----- Regarding: Re-order of wound supplies Could we please re-order the same gel wound pads that this patient received last time? Thanks, Dr. Amalia Hailey  Dx: ulcer left foot 0.5x0.5x0.3 cm  Dressing changes every day x 6 weeks.

## 2018-11-28 NOTE — Telephone Encounter (Signed)
Left message requesting pt call with the name of the gel wound pads she had previously received.

## 2018-11-30 ENCOUNTER — Encounter: Payer: Self-pay | Admitting: Podiatry

## 2018-11-30 NOTE — Progress Notes (Signed)
HPI: 63 year old female presenting today for follow up evaluation of an ulceration of the left foot. She states the wound is doing well. She has been using the post op shoe and gel pads as directed. She is interested in surgical intervention. There are no modifying factors noted. Patient is here for further evaluation and treatment.   Past Medical History:  Diagnosis Date  . Allergic rhinitis   . Arthritis   . CKD (chronic kidney disease), stage III ([place])   . Depression   . Factor V Leiden carrier ([place]) 08/26/2011  . Fatty liver   . Fibroids    dysfuction uterine bleeding  . GERD (gastroesophageal reflux disease)   . Hypertension   . Osteoarthritis   . Peripheral neuropathy   . Protein S deficiency ([place]) 08/26/2011  . Restless leg syndrome   . RLS (restless legs syndrome)      Physical Exam: General: The patient is alert and oriented x3 in no acute distress.  Dermatology:   Wound #1 noted to the sub-fifth MPJ of the left foot measuring 0.5 x 0.5 x 0.3 cm.  To the above-noted ulceration, there is no eschar. There is a moderate amount of slough, fibrin and necrotic tissue. Granulation tissue and wound base is red. There is no malodor. There is a minimal amount of serosanginous drainage noted. Periwound integrity is intact.  Hyperkeratotic lesion present on the left foot. Pain on palpation with a central nucleated core noted.  Skin is warm, dry and supple bilateral lower extremities.  Wound noted to the sub-fifth MPJ of the right foot has healed. Complete re-epithelialization has occurred. No drainage noted.   Vascular: Palpable pedal pulses bilaterally. Capillary refill within normal limits.  Neurological: Epicritic and protective threshold diminished bilaterally.   Musculoskeletal Exam: Clinical evidence of Tailor's bunion deformity noted to the respective foot. There is a moderate pain on palpation range of motion of the fifth MPJ. Range of motion within normal limits to all  pedal and ankle joints bilateral. Muscle strength 5/5 in all groups bilateral.   Assessment: 1. Ulceration of the sub-fifth MPJ left 2. Idiopathic peripheral neuropathy bilateral 3. Tailor's bunion left 4. Porokeratosis left  5. Ulceration of the sub-first MPJ right - healed     Plan of Care:  1. Patient evaluated.  2. Medically necessary excisional debridement including subcutaneous tissue was performed using a tissue nipper and a chisel blade. Excisional debridement of all the necrotic nonviable tissue down to healthy bleeding viable tissue was performed with post-debridement measurements same as pre-. 3. The wound was cleansed and dry sterile dressing applied. 4. New cultures taken from wound today.  5. Today we discussed the conservative versus surgical management of the presenting pathology. The patient opts for surgical management. All possible complications and details of the procedure were explained. All patient questions were answered. No guarantees were expressed or implied. 6. Authorization for surgery was initiated today. Surgery will consist of 5th metatarsal head resection left. Debridement of ulceration left.  7. Return to clinic one week post op.       Edrick Kins, DPM [facility]  Dr. Edrick Kins, DPM    [address], [place] [address]  Office [phone]  Fax [phone]

## 2018-12-01 ENCOUNTER — Telehealth: Payer: Self-pay | Admitting: *Deleted

## 2018-12-01 LAB — WOUND CULTURE
MICRO NUMBER:: 829791
SPECIMEN QUALITY:: ADEQUATE

## 2018-12-01 NOTE — Telephone Encounter (Signed)
-----   Message from Edrick Kins, DPM sent at 11/28/2018 10:48 AM EDT ----- Regarding: Re-order of wound supplies Could we please re-order the same gel wound pads that this patient received last time? Thanks, Dr. Amalia Hailey  Dx: ulcer left foot 0.5x0.5x0.3 cm  Dressing changes every day x 6 weeks.

## 2018-12-01 NOTE — Telephone Encounter (Signed)
Dr. Amalia Hailey ordered Suprasorb G for daily application to left 5th sub MPJ measuring 0.5 x 0.5 x 0.3cm, L97.522, G62.9, M21.622 for 15 day supply from [facility]. Faxed required form, clinical, and demographics to [facility].

## 2018-12-30 ENCOUNTER — Telehealth: Payer: Self-pay | Admitting: *Deleted

## 2018-12-30 NOTE — Telephone Encounter (Signed)
Called and spoke with [facility] Medicare representative Hedy Camara and representative stated that there is no pre-cert or prior authorization required for the procedure codes 28113, 11043 and the reference number is [id number]. Lattie Haw

## 2019-01-03 ENCOUNTER — Telehealth: Payer: Self-pay | Admitting: *Deleted

## 2019-01-03 NOTE — Telephone Encounter (Signed)
PHS - [facility] sent fax [phone] stating need for recent clinicals for resupply of wound care dressing.

## 2019-01-18 ENCOUNTER — Telehealth: Payer: Self-pay | Admitting: *Deleted

## 2019-01-18 NOTE — Telephone Encounter (Signed)
"  I'm having foot surgery tomorrow at the surgical center in [place] with Dr. Amalia Hailey.  Part of my instructions says not to take Ibuprofen a week before the surgery.  I've been on vacation.  On Monday night, I took three Ibuprofen.  So, the surgical center wanted me to call Dr. Amalia Hailey to make sure this will not be a problem.  If you could, give me a call back."  I am returning your call.  It's nothing to worry about.  You are fine to have the surgery.  "Nothing to worry about, okay, thank you for letting me know."

## 2019-01-19 ENCOUNTER — Other Ambulatory Visit: Payer: Self-pay | Admitting: Podiatry

## 2019-01-19 ENCOUNTER — Telehealth: Payer: Self-pay | Admitting: *Deleted

## 2019-01-19 ENCOUNTER — Encounter: Payer: Self-pay | Admitting: Podiatry

## 2019-01-19 ENCOUNTER — Telehealth: Payer: Self-pay | Admitting: Sports Medicine

## 2019-01-19 DIAGNOSIS — L97522 Non-pressure chronic ulcer of other part of left foot with fat layer exposed: Secondary | ICD-10-CM

## 2019-01-19 DIAGNOSIS — M21542 Acquired clubfoot, left foot: Secondary | ICD-10-CM

## 2019-01-19 MED ORDER — ONDANSETRON HCL 4 MG PO TABS: 4.0000 mg | ORAL_TABLET | Freq: Four times a day (QID) | ORAL | 0 refills | Status: DC | PRN

## 2019-01-19 MED ORDER — OXYCODONE-ACETAMINOPHEN 5-325 MG PO TABS: 1.0000 | ORAL_TABLET | Freq: Four times a day (QID) | ORAL | 0 refills | Status: DC | PRN

## 2019-01-19 NOTE — Progress Notes (Signed)
.  postop

## 2019-01-19 NOTE — Telephone Encounter (Signed)
Pt called states the medication was sent to the proper pharmacy in [place], but she is staying in [place] with her sister and would like it sent to [facility] in [place].

## 2019-01-19 NOTE — Telephone Encounter (Signed)
Gave permission for pharmacist to change qty from 30 to 28 tabs 7 day supply s/p left foot surgery on Percocet -Dr. Chauncey Cruel

## 2019-01-23 ENCOUNTER — Other Ambulatory Visit: Payer: Self-pay | Admitting: Podiatry

## 2019-01-23 ENCOUNTER — Other Ambulatory Visit: Payer: Self-pay

## 2019-01-23 ENCOUNTER — Ambulatory Visit (INDEPENDENT_AMBULATORY_CARE_PROVIDER_SITE_OTHER): Payer: Medicare Other | Admitting: Podiatry

## 2019-01-23 ENCOUNTER — Ambulatory Visit (INDEPENDENT_AMBULATORY_CARE_PROVIDER_SITE_OTHER): Payer: Medicare Other

## 2019-01-23 DIAGNOSIS — G629 Polyneuropathy, unspecified: Secondary | ICD-10-CM

## 2019-01-23 DIAGNOSIS — L97522 Non-pressure chronic ulcer of other part of left foot with fat layer exposed: Secondary | ICD-10-CM

## 2019-01-25 NOTE — Progress Notes (Signed)
   Subjective:  Patient presents today status post 5th metatarsal head resection left. DOS: 01/19/2019. She states she is doing well. She denies any pain or modifying factors. She states she has not needed to take any pain medications. Patient is here for further evaluation and treatment.    Past Medical History:  Diagnosis Date  . Allergic rhinitis   . Arthritis   . CKD (chronic kidney disease), stage III ([place])   . Depression   . Factor V Leiden carrier ([place]) 08/26/2011  . Fatty liver   . Fibroids    dysfuction uterine bleeding  . GERD (gastroesophageal reflux disease)   . Hypertension   . Osteoarthritis   . Peripheral neuropathy   . Protein S deficiency ([place]) 08/26/2011  . Restless leg syndrome   . RLS (restless legs syndrome)       Objective/Physical Exam Neurovascular status intact.  Skin incisions appear to be well coapted with sutures and staples intact. No sign of infectious process noted. No dehiscence. No active bleeding noted. Moderate edema noted to the surgical extremity.  Radiographic Exam:  Orthopedic hardware and osteotomies sites appear to be stable with routine healing.  Assessment: 1. s/p 5th metatarsal head resection left. DOS: 01/19/2019   Plan of Care:  1. Patient was evaluated. X-rays reviewed 2. Dressing changed.  3. Continue weightbearing in CAM boot.  4. Return to clinic in one week for suture removal.    Edrick Kins, DPM [facility]  Dr. Edrick Kins, [address]                                        [place], [place] [address]                Office [phone]  Fax [phone]

## 2019-02-01 ENCOUNTER — Other Ambulatory Visit: Payer: Self-pay

## 2019-02-01 ENCOUNTER — Ambulatory Visit (INDEPENDENT_AMBULATORY_CARE_PROVIDER_SITE_OTHER): Payer: Self-pay | Admitting: Podiatry

## 2019-02-01 DIAGNOSIS — M21622 Bunionette of left foot: Secondary | ICD-10-CM

## 2019-02-01 DIAGNOSIS — Z9889 Other specified postprocedural states: Secondary | ICD-10-CM

## 2019-02-06 NOTE — Progress Notes (Signed)
   Subjective:  Patient presents today status post 5th metatarsal head resection left. DOS: 01/19/2019. She states she is doing well and improving. She reports continued pain but states it is less than it has been. She reports associated swelling. She hs been using the CAM boot as directed. She denies any worsening factors at this time. Patient is here for further evaluation and treatment.    Past Medical History:  Diagnosis Date  . Allergic rhinitis   . Arthritis   . CKD (chronic kidney disease), stage III ([place])   . Depression   . Factor V Leiden carrier ([place]) 08/26/2011  . Fatty liver   . Fibroids    dysfuction uterine bleeding  . GERD (gastroesophageal reflux disease)   . Hypertension   . Osteoarthritis   . Peripheral neuropathy   . Protein S deficiency ([place]) 08/26/2011  . Restless leg syndrome   . RLS (restless legs syndrome)       Objective/Physical Exam Neurovascular status intact.  Skin incisions appear to be well coapted with sutures and staples intact. No sign of infectious process noted. No dehiscence. No active bleeding noted. Moderate edema noted to the surgical extremity.   Assessment: 1. s/p 5th metatarsal head resection left. DOS: 01/19/2019   Plan of Care:  1. Patient was evaluated.  2. Sutures removed.  3. Compression anklet dispensed.  4. Recommended good shoe gear.  5. Return to clinic in 4 weeks.   Edrick Kins, DPM [facility]  Dr. Edrick Kins, [place]                                        [place], [place] [address]                Office [phone]  Fax [phone]

## 2019-03-01 ENCOUNTER — Ambulatory Visit (INDEPENDENT_AMBULATORY_CARE_PROVIDER_SITE_OTHER): Payer: Medicare Other | Admitting: Podiatry

## 2019-03-01 ENCOUNTER — Ambulatory Visit (INDEPENDENT_AMBULATORY_CARE_PROVIDER_SITE_OTHER): Payer: Medicare Other

## 2019-03-01 ENCOUNTER — Other Ambulatory Visit: Payer: Self-pay | Admitting: Podiatry

## 2019-03-01 ENCOUNTER — Other Ambulatory Visit: Payer: Self-pay

## 2019-03-01 DIAGNOSIS — Z9889 Other specified postprocedural states: Secondary | ICD-10-CM

## 2019-03-01 DIAGNOSIS — M21622 Bunionette of left foot: Secondary | ICD-10-CM

## 2019-03-05 ENCOUNTER — Encounter: Payer: Self-pay | Admitting: Podiatry

## 2019-03-05 NOTE — Progress Notes (Signed)
   Subjective:  Patient presents today status post 5th metatarsal head resection left. DOS: 01/19/2019. She reports some intermittent pain with some associated redness and swelling. She denies any aggravating factors. She has been stretching the foot and using compression socks as directed. Patient is here for further evaluation and treatment.    Past Medical History:  Diagnosis Date  . Allergic rhinitis   . Arthritis   . CKD (chronic kidney disease), stage III ([place])   . Depression   . Factor V Leiden carrier ([place]) 08/26/2011  . Fatty liver   . Fibroids    dysfuction uterine bleeding  . GERD (gastroesophageal reflux disease)   . Hypertension   . Osteoarthritis   . Peripheral neuropathy   . Protein S deficiency ([place]) 08/26/2011  . Restless leg syndrome   . RLS (restless legs syndrome)       Objective/Physical Exam Neurovascular status intact.  Skin incisions appear to be well coapted. No sign of infectious process noted. No dehiscence. No active bleeding noted. Moderate edema noted to the surgical extremity.  Radiographic Exam:  Osteotomies sites appear to be stable with routine healing.  Assessment: 1. s/p 5th metatarsal head resection left. DOS: 01/19/2019   Plan of Care:  1. Patient was evaluated. X-Rays reviewed.  2. May resume full activity with no restrictions.  3. Recommended good shoe gear.  4. Return to clinic as needed.    Edrick Kins, DPM [facility]  Dr. Edrick Kins, [place]                                        [place], [place] [address]                Office [phone]  Fax [phone]

## 2019-03-07 ENCOUNTER — Encounter: Payer: Self-pay | Admitting: Podiatry

## 2019-03-15 ENCOUNTER — Ambulatory Visit: Payer: Medicare Other | Admitting: Podiatry

## 2019-03-15 ENCOUNTER — Other Ambulatory Visit: Payer: Self-pay

## 2019-03-15 DIAGNOSIS — Z9889 Other specified postprocedural states: Secondary | ICD-10-CM

## 2019-03-15 DIAGNOSIS — M21622 Bunionette of left foot: Secondary | ICD-10-CM

## 2019-03-20 NOTE — Progress Notes (Signed)
   Subjective:  Patient presents today status post 5th metatarsal head resection left. DOS: 01/19/2019. She states she is doing well. She denies any significant pain regarding surgery or modifying factors. She has been good shoes as directed.  She has a chief complaint of callus lesions noted to the bilateral feet that cause pain while ambulating in shoes that have been present for at least the past several months. She has not had any recent treatment. Patient is here for further evaluation and treatment.   Past Medical History:  Diagnosis Date  . Allergic rhinitis   . Arthritis   . CKD (chronic kidney disease), stage III ([place])   . Depression   . Factor V Leiden carrier ([place]) 08/26/2011  . Fatty liver   . Fibroids    dysfuction uterine bleeding  . GERD (gastroesophageal reflux disease)   . Hypertension   . Osteoarthritis   . Peripheral neuropathy   . Protein S deficiency ([place]) 08/26/2011  . Restless leg syndrome   . RLS (restless legs syndrome)       Objective/Physical Exam Neurovascular status intact.  Skin incisions appear to be well coapted. No sign of infectious process noted. No dehiscence. No active bleeding noted. Moderate edema noted to the surgical extremity. Hyperkeratotic lesion(s) present on the bilateral feet. Pain on palpation with a central nucleated core noted.   Assessment: 1. s/p 5th metatarsal head resection left. DOS: 01/19/2019 2. Pre-ulcerative callus lesions bilateral feet    Plan of Care:  1. Patient was evaluated.   2. May resume full activity with no restrictions.  3. Recommended good shoe gear.  4. Excisional debridement of keratotic lesion(s) using a chisel blade was performed without incident. Light dressing applied.  5. Return to clinic as needed.    Edrick Kins, DPM [facility]  Dr. Edrick Kins, [address]                                        [place], [place] [address]                Office [phone]    Fax [phone]

## 2019-04-18 ENCOUNTER — Other Ambulatory Visit: Payer: Self-pay | Admitting: Family Medicine

## 2019-04-18 ENCOUNTER — Other Ambulatory Visit: Payer: Self-pay

## 2019-04-18 ENCOUNTER — Ambulatory Visit
Admission: RE | Admit: 2019-04-18 | Discharge: 2019-04-18 | Disposition: A | Discharge status: Home / Self Care | Payer: Medicare PPO | Source: Ambulatory Visit | Attending: Family Medicine | Admitting: Family Medicine

## 2019-04-18 DIAGNOSIS — M25522 Pain in left elbow: Secondary | ICD-10-CM

## 2019-04-18 DIAGNOSIS — M25532 Pain in left wrist: Secondary | ICD-10-CM

## 2019-05-08 ENCOUNTER — Ambulatory Visit: Payer: Medicare Other | Admitting: Adult Health

## 2019-05-17 ENCOUNTER — Other Ambulatory Visit: Payer: Self-pay | Admitting: Family Medicine

## 2019-05-18 ENCOUNTER — Other Ambulatory Visit: Payer: Self-pay | Admitting: Family Medicine

## 2019-05-18 DIAGNOSIS — M7989 Other specified soft tissue disorders: Secondary | ICD-10-CM

## 2019-05-18 DIAGNOSIS — R0609 Other forms of dyspnea: Secondary | ICD-10-CM

## 2019-05-18 DIAGNOSIS — M549 Dorsalgia, unspecified: Secondary | ICD-10-CM

## 2019-05-19 ENCOUNTER — Other Ambulatory Visit: Payer: Self-pay | Admitting: Family Medicine

## 2019-05-19 ENCOUNTER — Ambulatory Visit
Admission: RE | Admit: 2019-05-19 | Discharge: 2019-05-19 | Disposition: A | Discharge status: Home / Self Care | Payer: Medicare PPO | Source: Ambulatory Visit | Attending: Family Medicine | Admitting: Family Medicine

## 2019-05-19 ENCOUNTER — Encounter: Payer: Self-pay | Admitting: Radiology

## 2019-05-19 ENCOUNTER — Other Ambulatory Visit: Payer: Self-pay

## 2019-05-19 DIAGNOSIS — R0609 Other forms of dyspnea: Secondary | ICD-10-CM

## 2019-05-19 DIAGNOSIS — M7989 Other specified soft tissue disorders: Secondary | ICD-10-CM

## 2019-05-19 DIAGNOSIS — M549 Dorsalgia, unspecified: Secondary | ICD-10-CM

## 2019-05-19 MED ORDER — IOPAMIDOL (ISOVUE-370) INJECTION 76%
75.0000 mL | Freq: Once | INTRAVENOUS | Status: AC | PRN
  Administered 2019-05-19: 75 mL via INTRAVENOUS

## 2019-05-20 ENCOUNTER — Ambulatory Visit: Payer: Medicare PPO | Attending: Internal Medicine

## 2019-05-20 DIAGNOSIS — Z23 Encounter for immunization: Secondary | ICD-10-CM

## 2019-05-20 NOTE — Progress Notes (Signed)
   Covid-19 Vaccination Clinic  Name:  [name] [name]    MRN: [id number] DOB: [birth year]  05/20/2019  [name] [name] was observed post Covid-19 immunization for 15 minutes without incidence. She was provided with Vaccine Information Sheet and instruction to access the V-Safe system.   [name] [name] was instructed to call 911 with any severe reactions post vaccine: [place] Difficulty breathing  . Swelling of your face and throat  . A fast heartbeat  . A bad rash all over your body  . Dizziness and weakness    Immunizations Administered    Name Date Dose VIS Date Route   Pfizer COVID-19 Vaccine 05/20/2019  1:04 PM 0.3 mL 03/10/2019 Intramuscular   Manufacturer: [place]   Lot: [id number]   [place]: [id number]

## 2019-05-24 DIAGNOSIS — Z7189 Other specified counseling: Secondary | ICD-10-CM | POA: Insufficient documentation

## 2019-05-24 DIAGNOSIS — I421 Obstructive hypertrophic cardiomyopathy: Secondary | ICD-10-CM | POA: Insufficient documentation

## 2019-05-24 NOTE — Progress Notes (Signed)
Cardiology Office Note   Date:  05/25/2019   ID:  Mahnoor, Fitzner [birth year], MRN [id number]  PCP:  Harlan Stains, MD  Cardiologist:   No primary care provider on file. Referring:  Harlan Stains, MD  No chief complaint on file.     History of Present Illness: [name] [name] is a 64 y.o. female who referred by Harlan Stains, MD for evaluation of questionable pulmonary artery hypertension.  I last saw her in 2017 for evaluation of bradycardia.  She has had this noted before. A Holter demonstrated only low heart rates when she was asleep. POET (Plain Old Exercise Treadmill) demonstrated normal heart rate response with her heart rate increasing up to 153. She did have a negative stress perfusion study in 2015. She had some basal hypertrophy on echocardiogram.  (However, there was no evidence of this on echo in 2017.)    She has had some increased shortness of breath.  This has been happening over the last year.  It seems to be episodic.  She will notice it when she is doing laundry sometimes.  She noticed that with activities such as sweeping.  However, is not always reproducible.  She is also having it sometimes at rest.  She is not describing PND or orthopnea.  She has not had any palpitations, presyncope or syncope.  She is not had any chest pressure, neck or arm discomfort.  She was having some lower extremity swelling.  Because of this she had no extremity Dopplers and did not have a DVT.  She has an extensive family history of clotting disorder.  She herself is a factor V Leiden carrier.  She had a CT scan which did not demonstrate pulmonary emboli but which suggested elevated pulmonary pressures.  Of note she has had sleep study in the past but did not have significant sleep apnea though she has restless legs.    Past Medical History:  Diagnosis Date  . Allergic rhinitis   . Arthritis   . Depression   . Factor V Leiden carrier ([place]) 08/26/2011  . Fatty liver   .  Fibroids    dysfuction uterine bleeding  . GERD (gastroesophageal reflux disease)   . Hypertension   . Osteoarthritis   . Peripheral neuropathy   . Restless leg syndrome     Past Surgical History:  Procedure Laterality Date  . CHOLECYSTECTOMY    . KNEE SURGERY     right TKR  . LUMBAR SPINE SURGERY    . TUBAL LIGATION     bilateral  . URETHRAL SLING    . VAGINAL HYSTERECTOMY       Current Outpatient Medications  Medication Sig Dispense Refill  . acetaminophen (TYLENOL) 325 MG tablet Take 650 mg by mouth every 6 (six) hours as needed for mild pain.    Marland Kitchen ALPRAZolam (XANAX) 0.25 MG tablet     . amLODipine (NORVASC) 5 MG tablet Take 5 mg by mouth daily.     . cetirizine (ZYRTEC) 10 MG tablet Take 10 mg by mouth as needed for allergies.    . clotrimazole-betamethasone (LOTRISONE) cream Apply 1 application topically 2 (two) times daily. 45 g 1  . doxycycline (VIBRA-TABS) 100 MG tablet Take 1 tablet (100 mg total) by mouth 2 (two) times daily. 20 tablet 0  . DULoxetine (CYMBALTA) 60 MG capsule Take 1 capsule (60 mg total) by mouth 2 (two) times daily. 180 capsule 4  . estradiol (ESTRACE) 0.5 MG tablet Take 0.5 mg  by mouth daily.    Marland Kitchen gabapentin (NEURONTIN) 600 MG tablet Take 2 tablets in am and pm and 1 tablet at noon. (Patient taking differently: Take 1,200 mg by mouth 2 (two) times daily. ) 150 tablet 11  . gentamicin cream (GARAMYCIN) 0.1 % Apply 1 application topically 2 (two) times daily. 30 g 1  . losartan-hydrochlorothiazide (HYZAAR) 50-12.5 MG tablet Take 1 tablet by mouth daily.     Marland Kitchen MYRBETRIQ 50 MG TB24 tablet Take 50 mg by mouth daily.     . pantoprazole (PROTONIX) 40 MG tablet Take 40 mg by mouth 2 (two) times daily.     . potassium chloride SA (K-DUR) 20 MEQ tablet Take 1 tablet (20 mEq total) by mouth daily. Take one tablet twice a day for five days, then one tablet once a day 35 tablet 0  . rOPINIRole (REQUIP) 2 MG tablet Take 1 tablet (2 mg total) by mouth at bedtime.  90 tablet 4  . traMADol (ULTRAM) 50 MG tablet Take 1-2 tablets (50-100 mg total) by mouth every 6 (six) hours as needed. 60 tablet 0   No current facility-administered medications for this visit.   Family History  Problem Relation Age of Onset  . Coronary artery disease Father 5       deceased- TIAs  . Stroke Father   . Hypertension Mother 5       alive  . Deep vein thrombosis Mother   . Neuropathy Mother   . Osteoporosis Mother   . Liver disease Sister   . Other Sister        ITP  . Cancer Sister        breast   Strong family history of clotting disorder  Social History   Socioeconomic History  . Marital status: Widowed    Spouse name: Not on file  . Number of children: 2  . Years of education: Not on file  . Highest education level: Not on file  Occupational History  . 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 Smoking status: Never Smoker  . Smokeless tobacco: Never Used  Substance and Sexual Activity  . Alcohol use: No    Alcohol/week: 0.0 standard drinks  . Drug use: No  . Sexual activity: Not on file  Other Topics Concern  . Not on file  Social History Narrative   Lives at home son and two children.  Is retired, has 2 yr college degree.  Has 2 children.  Drinks caffeine.     Social Determinants of Health   Financial Resource Strain:   . Difficulty of Paying Living Expenses: Not on file  Food Insecurity:   . Worried About Charity fundraiser in the Last Year: Not on file  . Ran Out of Food in the Last Year: Not on file  Transportation Needs:   . Lack of Transportation (Medical): Not on file  . Lack of Transportation (Non-Medical): Not on file  Physical Activity:   . Days of Exercise per Week: Not on file  . Minutes of Exercise per Session: Not on file  Stress:   . Feeling of Stress : Not on file  Social Connections:   . Frequency of Communication with Friends and Family: Not on file  . Frequency of Social  Gatherings with Friends and Family: Not on file  . Attends Religious Services: Not on file  . Active Member of Clubs or Organizations: Not on file  .  Attends Archivist Meetings: Not on file  . Marital Status: Not on file  Intimate Partner Violence:   . Fear of Current or Ex-Partner: Not on file  . Emotionally Abused: Not on file  . Physically Abused: Not on file  . Sexually Abused: Not on file    Allergies:   Codeine and Diclofenac sodium    ROS:  Please see the history of present illness.   Otherwise, review of systems are positive for none.   All other systems are reviewed and negative.    PHYSICAL EXAM: VS:  BP 116/74   Pulse 80   Temp (!) 97 F (36.1 C)   Ht 5\' 3"  (1.6 m)   Wt 232 lb (105.2 kg)   SpO2 98%   BMI 41.10 kg/m  , BMI Body mass index is 41.1 kg/m. GENERAL:  Well appearing HEENT:  Pupils equal round and reactive, fundi not visualized, oral mucosa unremarkable NECK:  No jugular venous distention, waveform within normal limits, carotid upstroke brisk and symmetric, no bruits, no thyromegaly LYMPHATICS:  No cervical, inguinal adenopathy LUNGS:  Clear to auscultation bilaterally BACK:  No CVA tenderness CHEST:  Unremarkable HEART:  PMI not displaced or sustained,S1 and S2 within normal limits, no S3, no S4, no clicks, no rubs, no murmurs ABD:  Flat, positive bowel sounds normal in frequency in pitch, no bruits, no rebound, no guarding, no midline pulsatile mass, no hepatomegaly, no splenomegaly EXT:  2 plus pulses throughout, no edema, no cyanosis no clubbing SKIN:  No rashes no nodules NEURO:  Cranial nerves II through XII grossly intact, motor grossly intact throughout PSYCH:  Cognitively intact, oriented to person place and time    EKG:  EKG is ordered today. The ekg ordered today demonstrates sinus rhythm, rate 88, interventricular conduction delay, left axis deviation.  No change from previous.   Recent Labs: 08/02/2018: BUN 17; Creatinine,  Ser 1.18; Hemoglobin 12.9; Platelets 240; Potassium 3.0; Sodium 141    Lipid Panel No results found for: CHOL, TRIG, HDL, CHOLHDL, VLDL, LDLCALC, LDLDIRECT    Wt Readings from Last 3 Encounters:  05/25/19 232 lb (105.2 kg)  08/01/18 257 lb (116.6 kg)  05/02/18 242 lb (109.8 kg)      Other studies Reviewed: Additional studies/ records that were reviewed today include: CT, primary care notes. Review of the above records demonstrates:  Please see elsewhere in the note.     ASSESSMENT AND PLAN:  ABNORMAL CHEST CT - There is a question of pulmonary artery hypertension though I do not see evidence of this on exam.  I will follow up with an echocardiogram.  Also with her dyspnea I will check a BNP level.  Further evaluation will be based on these results although if these are normal I might suggest pulmonary evaluation as a next step.    HYPERTENSION -  The blood pressure is  normal.  No change in therapy.   SEPTAL HYPERTROPHY - He did not have evidence of this on the last echo but I will screen for this with the upcoming study.   COVID EDUCATION - She is gotten those one of her vaccine.   Current medicines are reviewed at length with the patient today.  The patient does not have concerns regarding medicines.  The following changes have been made:  no change  Labs/ tests ordered today include:   Orders Placed This Encounter  Procedures  . Brain natriuretic peptide  . EKG 12-Lead  . ECHOCARDIOGRAM COMPLETE  Disposition:   FU with me as needed based on the results of the above.     Signed, Minus Breeding, MD  05/25/2019 4:58 PM    [place]

## 2019-05-25 ENCOUNTER — Encounter: Payer: Self-pay | Admitting: Cardiology

## 2019-05-25 ENCOUNTER — Ambulatory Visit: Payer: Medicare PPO | Admitting: Cardiology

## 2019-05-25 ENCOUNTER — Telehealth: Payer: Self-pay | Admitting: Cardiology

## 2019-05-25 ENCOUNTER — Other Ambulatory Visit: Payer: Self-pay

## 2019-05-25 VITALS — BP 116/74 | HR 80 | Temp 97.0°F | Ht 63.0 in | Wt 232.0 lb

## 2019-05-25 DIAGNOSIS — Z7189 Other specified counseling: Secondary | ICD-10-CM

## 2019-05-25 DIAGNOSIS — I272 Pulmonary hypertension, unspecified: Secondary | ICD-10-CM | POA: Diagnosis not present

## 2019-05-25 DIAGNOSIS — R0600 Dyspnea, unspecified: Secondary | ICD-10-CM

## 2019-05-25 DIAGNOSIS — R001 Bradycardia, unspecified: Secondary | ICD-10-CM

## 2019-05-25 DIAGNOSIS — I10 Essential (primary) hypertension: Secondary | ICD-10-CM | POA: Diagnosis not present

## 2019-05-25 NOTE — Patient Instructions (Signed)
Medication Instructions:  No changes *If you need a refill on your cardiac medications before your next appointment, please call your pharmacy*   Lab Work: Your physician recommends that you return for lab work when you come for your echocardiogram  If you have labs (blood work) drawn today and your tests are completely normal, you will receive your results only by: Marland Kitchen MyChart Message (if you have MyChart) OR . A paper copy in the mail If you have any lab test that is abnormal or we need to change your treatment, we will call you to review the results.   Testing/Procedures: Your physician has requested that you have an echocardiogram. Echocardiography is a painless test that uses sound waves to create images of your heart. It provides your doctor with information about the size and shape of your heart and how well your heart's chambers and valves are working. This procedure takes approximately one hour. There are no restrictions for this procedure. [address]   Follow-Up: At [facility], you and your health needs are our priority.  As part of our continuing mission to provide you with exceptional heart care, we have created designated Provider Care Teams.  These Care Teams include your primary Cardiologist (physician) and Advanced Practice Providers (APPs -  Physician Assistants and Nurse Practitioners) who all work together to provide you with the care you need, when you need it.  We recommend signing up for the patient portal called "MyChart".  Sign up information is provided on this After Visit Summary.  MyChart is used to connect with patients for Virtual Visits (Telemedicine).  Patients are able to view lab/test results, encounter notes, upcoming appointments, etc.  Non-urgent messages can be sent to your provider as well.   To learn more about what you can do with MyChart, go to [email or web address].    Your next appointment:   Follow up as needed

## 2019-05-25 NOTE — Telephone Encounter (Signed)
Pt is able to ambulate on her own and free of any disparities.. her sister is worried about her and would like to come to her appt today with Dr. Percival Spanish... I offered for her to wait in the car and she can be on Speakerphone during the visit and can listen and ask questions as much as she needs so she and the pt feel comfortable with everything they hear at the visit... pt agreed and will talk with her sister about it prior to her OV today.

## 2019-05-25 NOTE — Telephone Encounter (Signed)
° °  Pt is requesting if her sister can come in with her on her appointment today 05/25/19 with Dr. Percival Spanish. She said her and her sister is very worried and would like to have her sister by her side.  Please advise

## 2019-06-05 ENCOUNTER — Other Ambulatory Visit: Payer: Self-pay

## 2019-06-05 ENCOUNTER — Ambulatory Visit: Payer: Medicare PPO | Admitting: Podiatry

## 2019-06-05 DIAGNOSIS — L989 Disorder of the skin and subcutaneous tissue, unspecified: Secondary | ICD-10-CM

## 2019-06-05 DIAGNOSIS — M722 Plantar fascial fibromatosis: Secondary | ICD-10-CM

## 2019-06-08 NOTE — Progress Notes (Signed)
   Subjective: 64 y.o. female presenting today for follow up evaluation of pre-ulcerative callus lesions of the bilateral feet. She reports improvement but states the right foot is worse than the left. She has not done anything at home for treatment.  She also has a new complaint of right heel pain that began a few months ago. Walking and being on the foot increases the pain. She has been wearing good shoe gear but states it has not been helping lately. Patient is here for further evaluation and treatment.   Past Medical History:  Diagnosis Date  . Allergic rhinitis   . Arthritis   . Depression   . Factor V Leiden carrier ([place]) 08/26/2011  . Fatty liver   . Fibroids    dysfuction uterine bleeding  . GERD (gastroesophageal reflux disease)   . Hypertension   . Osteoarthritis   . Peripheral neuropathy   . Restless leg syndrome      Objective: Physical Exam General: The patient is alert and oriented x3 in no acute distress.  Dermatology: Hyperkeratotic lesion(s) present on the bilateral feet. Pain on palpation with a central nucleated core noted. Skin is warm, dry and supple bilateral lower extremities. Negative for open lesions or macerations bilateral.   Vascular: Dorsalis Pedis and Posterior Tibial pulses palpable bilateral.  Capillary fill time is immediate to all digits.  Neurological: Epicritic and protective threshold intact bilateral.   Musculoskeletal: Tenderness to palpation to the plantar aspect of the right heel along the plantar fascia. All other joints range of motion within normal limits bilateral. Strength 5/5 in all groups bilateral.   Assessment: 1. Plantar fasciitis right 2. Pre-ulcerative callus lesions noted to the bilateral feet x 4   Plan of Care:  1. Patient evaluated.    2. Injection of 0.5cc Celestone soluspan injected into the right plantar fascia  3. Excisional debridement of keratotic lesion(s) using a chisel blade was performed without incident.  Light dressing applied.  4. Appointment with Liliane Channel, Pedorthist, for custom molded orthotics.  5. Return to clinic as needed.    Edrick Kins, DPM [facility]  Dr. Edrick Kins, DPM    [address], [place] [address]                Office [phone]  Fax [phone]

## 2019-06-12 ENCOUNTER — Ambulatory Visit (HOSPITAL_COMMUNITY): Payer: Medicare PPO | Attending: Internal Medicine

## 2019-06-12 ENCOUNTER — Other Ambulatory Visit: Payer: Self-pay

## 2019-06-12 DIAGNOSIS — I272 Pulmonary hypertension, unspecified: Secondary | ICD-10-CM | POA: Insufficient documentation

## 2019-06-13 ENCOUNTER — Ambulatory Visit: Payer: Medicare PPO | Attending: Internal Medicine

## 2019-06-13 DIAGNOSIS — Z23 Encounter for immunization: Secondary | ICD-10-CM

## 2019-06-13 NOTE — Progress Notes (Signed)
   Covid-19 Vaccination Clinic  Name:  SOLYANA VILLENA    MRN: [id number] DOB: 06-16-1955  06/13/2019  Ms. Wees was observed post Covid-19 immunization for 15 minutes without incident. She was provided with Vaccine Information Sheet and instruction to access the V-Safe system.   Ms. Reichlin was instructed to call 911 with any severe reactions post vaccine: Marland Kitchen Difficulty breathing  . Swelling of face and throat  . A fast heartbeat  . A bad rash all over body  . Dizziness and weakness   Immunizations Administered    Name Date Dose VIS Date Route   Pfizer COVID-19 Vaccine 06/13/2019 10:32 AM 0.3 mL 03/10/2019 Intramuscular   Manufacturer: Sun   Lot: [id number]   [place]: [id number]

## 2019-06-21 ENCOUNTER — Other Ambulatory Visit: Payer: Self-pay | Admitting: Neurology

## 2019-06-21 DIAGNOSIS — R202 Paresthesia of skin: Secondary | ICD-10-CM

## 2019-06-23 ENCOUNTER — Other Ambulatory Visit: Payer: Self-pay | Admitting: Cardiology

## 2019-06-23 ENCOUNTER — Other Ambulatory Visit: Payer: Medicare PPO | Admitting: Orthotics

## 2019-06-23 ENCOUNTER — Other Ambulatory Visit: Payer: Self-pay

## 2019-06-23 DIAGNOSIS — M722 Plantar fascial fibromatosis: Secondary | ICD-10-CM | POA: Diagnosis not present

## 2019-06-24 LAB — BRAIN NATRIURETIC PEPTIDE: BNP: 44.8 pg/mL (ref 0.0–100.0)

## 2019-07-03 ENCOUNTER — Other Ambulatory Visit: Payer: Self-pay

## 2019-07-03 ENCOUNTER — Telehealth: Payer: Self-pay | Admitting: *Deleted

## 2019-07-03 DIAGNOSIS — R0602 Shortness of breath: Secondary | ICD-10-CM

## 2019-07-03 NOTE — Progress Notes (Signed)
Minus Breeding, MD  06/25/2019 11:04 AM EDT    BNP and echo normal. I would suggest PFTs. Please order.

## 2019-07-03 NOTE — Telephone Encounter (Signed)
Spoke with patient regarding appointment for PFT studies and COVID testing ordered by Dr. Percival Spanish.  PFT scheduled Wednesday 07/26/19 at [facility]---arrival time is 11:30 am 1st floor admissions office  for 12 noon study--will mail information to patient including instruction sheet

## 2019-07-07 DIAGNOSIS — N3281 Overactive bladder: Secondary | ICD-10-CM | POA: Diagnosis not present

## 2019-07-14 ENCOUNTER — Other Ambulatory Visit: Payer: Medicare PPO | Admitting: Orthotics

## 2019-07-18 DIAGNOSIS — R300 Dysuria: Secondary | ICD-10-CM | POA: Diagnosis not present

## 2019-07-18 DIAGNOSIS — R609 Edema, unspecified: Secondary | ICD-10-CM | POA: Diagnosis not present

## 2019-07-18 DIAGNOSIS — Z7901 Long term (current) use of anticoagulants: Secondary | ICD-10-CM | POA: Diagnosis not present

## 2019-07-18 DIAGNOSIS — R5383 Other fatigue: Secondary | ICD-10-CM | POA: Diagnosis not present

## 2019-07-18 DIAGNOSIS — N898 Other specified noninflammatory disorders of vagina: Secondary | ICD-10-CM | POA: Diagnosis not present

## 2019-07-19 ENCOUNTER — Other Ambulatory Visit: Payer: Medicare PPO

## 2019-07-21 NOTE — Telephone Encounter (Signed)
Patient states she is requesting to reschedule Pulmonary Function Test scheduled for 07/26/19. Please call to assist.

## 2019-07-21 NOTE — Telephone Encounter (Signed)
Message sent to scheduling to cancel and reschedule PFT

## 2019-07-21 NOTE — Telephone Encounter (Signed)
Patient is returning Kay's call in regards to rescheduling her PFT.

## 2019-07-21 NOTE — Telephone Encounter (Signed)
Routed to [employer]

## 2019-07-22 ENCOUNTER — Other Ambulatory Visit (HOSPITAL_COMMUNITY): Payer: Medicare PPO

## 2019-07-24 ENCOUNTER — Telehealth: Payer: Self-pay | Admitting: Cardiology

## 2019-07-24 NOTE — Telephone Encounter (Signed)
Spoke with patient regarding new appointment date and time for PFT Thursday 08/10/19 at 1:00pm at [facility]---arrival time is 12:30 pm.  Covid screening scheduled Monday 08/07/19 at 1:45pm at [facility].  Will mail information to patient

## 2019-07-26 ENCOUNTER — Encounter (HOSPITAL_COMMUNITY): Payer: Medicare PPO

## 2019-07-27 ENCOUNTER — Other Ambulatory Visit: Payer: Medicare PPO | Admitting: Orthotics

## 2019-08-03 ENCOUNTER — Other Ambulatory Visit: Payer: Self-pay

## 2019-08-03 ENCOUNTER — Ambulatory Visit: Payer: Medicare PPO | Admitting: Orthotics

## 2019-08-03 DIAGNOSIS — L03116 Cellulitis of left lower limb: Secondary | ICD-10-CM

## 2019-08-03 NOTE — Progress Notes (Signed)
Patient came in today to pick up custom made foot orthotics.  The goals were accomplished and the patient reported no dissatisfaction with said orthotics.  Patient was advised of breakin period and how to report any issues. 

## 2019-08-07 ENCOUNTER — Other Ambulatory Visit (HOSPITAL_COMMUNITY)
Admission: RE | Admit: 2019-08-07 | Discharge: 2019-08-07 | Disposition: A | Discharge status: Home / Self Care | Payer: Medicare PPO | Source: Ambulatory Visit | Attending: Cardiology | Admitting: Cardiology

## 2019-08-07 DIAGNOSIS — Z20822 Contact with and (suspected) exposure to covid-19: Secondary | ICD-10-CM | POA: Diagnosis not present

## 2019-08-07 DIAGNOSIS — Z01812 Encounter for preprocedural laboratory examination: Secondary | ICD-10-CM | POA: Diagnosis not present

## 2019-08-07 LAB — SARS CORONAVIRUS 2 (TAT 6-24 HRS): SARS Coronavirus 2: NEGATIVE

## 2019-08-09 ENCOUNTER — Ambulatory Visit: Payer: Medicare PPO | Admitting: Podiatry

## 2019-08-10 ENCOUNTER — Other Ambulatory Visit: Payer: Self-pay

## 2019-08-10 ENCOUNTER — Ambulatory Visit (HOSPITAL_COMMUNITY)
Admission: RE | Admit: 2019-08-10 | Discharge: 2019-08-10 | Disposition: A | Discharge status: Home / Self Care | Payer: Medicare PPO | Source: Ambulatory Visit | Attending: Cardiology | Admitting: Cardiology

## 2019-08-10 DIAGNOSIS — R0602 Shortness of breath: Secondary | ICD-10-CM | POA: Diagnosis not present

## 2019-08-10 LAB — PULMONARY FUNCTION TEST
DL/VA % pred: 106 %
DL/VA: 4.47 ml/min/mmHg/L
DLCO unc % pred: 80 %
DLCO unc: 15.09 ml/min/mmHg
FEF 25-75 Post: 2.61 L/sec
FEF 25-75 Pre: 2.16 L/sec
FEF2575-%Change-Post: 21 %
FEF2575-%Pred-Post: 130 %
FEF2575-%Pred-Pre: 107 %
FEV1-%Change-Post: 6 %
FEV1-%Pred-Post: 85 %
FEV1-%Pred-Pre: 80 %
FEV1-Post: 1.91 L
FEV1-Pre: 1.8 L
FEV1FVC-%Change-Post: 4 %
FEV1FVC-%Pred-Pre: 114 %
FEV6-%Change-Post: 1 %
FEV6-%Pred-Post: 73 %
FEV6-%Pred-Pre: 72 %
FEV6-Post: 2.09 L
FEV6-Pre: 2.05 L
FEV6FVC-%Pred-Post: 104 %
FEV6FVC-%Pred-Pre: 104 %
FVC-%Change-Post: 1 %
FVC-%Pred-Post: 70 %
FVC-%Pred-Pre: 69 %
FVC-Post: 2.09 L
FVC-Pre: 2.05 L
Post FEV1/FVC ratio: 92 %
Post FEV6/FVC ratio: 100 %
Pre FEV1/FVC ratio: 88 %
Pre FEV6/FVC Ratio: 100 %
RV % pred: 70 %
RV: 1.44 L
TLC % pred: 74 %
TLC: 3.61 L

## 2019-08-10 MED ORDER — ALBUTEROL SULFATE (2.5 MG/3ML) 0.083% IN NEBU
2.5000 mg | INHALATION_SOLUTION | Freq: Once | RESPIRATORY_TRACT | Status: AC
  Administered 2019-08-10: 2.5 mg via RESPIRATORY_TRACT

## 2019-08-11 ENCOUNTER — Encounter: Payer: Self-pay | Admitting: *Deleted

## 2019-08-11 NOTE — Telephone Encounter (Signed)
-----   Message from Minus Breeding, MD sent at 08/10/2019 10:52 PM EDT ----- There is some restrictive lung disease on echo.  I would like her to see pulmonary to continue the work up of her dyspnea.  Call Ms. Margolis with the results and send results to Harlan Stains, MD

## 2019-08-11 NOTE — Telephone Encounter (Signed)
This encounter was created in error - please disregard.

## 2019-08-14 DIAGNOSIS — M7918 Myalgia, other site: Secondary | ICD-10-CM | POA: Diagnosis not present

## 2019-08-14 DIAGNOSIS — L97522 Non-pressure chronic ulcer of other part of left foot with fat layer exposed: Secondary | ICD-10-CM | POA: Diagnosis not present

## 2019-08-14 DIAGNOSIS — G8929 Other chronic pain: Secondary | ICD-10-CM | POA: Diagnosis not present

## 2019-08-14 DIAGNOSIS — M150 Primary generalized (osteo)arthritis: Secondary | ICD-10-CM | POA: Diagnosis not present

## 2019-08-14 DIAGNOSIS — Z7189 Other specified counseling: Secondary | ICD-10-CM | POA: Diagnosis not present

## 2019-08-14 DIAGNOSIS — Z79899 Other long term (current) drug therapy: Secondary | ICD-10-CM | POA: Diagnosis not present

## 2019-08-14 DIAGNOSIS — M2550 Pain in unspecified joint: Secondary | ICD-10-CM | POA: Diagnosis not present

## 2019-08-14 DIAGNOSIS — L409 Psoriasis, unspecified: Secondary | ICD-10-CM | POA: Diagnosis not present

## 2019-08-14 DIAGNOSIS — L4050 Arthropathic psoriasis, unspecified: Secondary | ICD-10-CM | POA: Diagnosis not present

## 2019-08-15 ENCOUNTER — Telehealth: Payer: Self-pay | Admitting: Cardiology

## 2019-08-15 ENCOUNTER — Other Ambulatory Visit: Payer: Self-pay

## 2019-08-15 DIAGNOSIS — R0602 Shortness of breath: Secondary | ICD-10-CM

## 2019-08-15 NOTE — Telephone Encounter (Signed)
New message   Patient is returning call for PFTest results. Please call.

## 2019-08-15 NOTE — Progress Notes (Signed)
Minus Breeding, MD  08/10/2019 10:52 PM EDT    There is some restrictive lung disease on echo. I would like her to see pulmonary to continue the work up of her dyspnea. Call Ms. [name] [name] with the results and send results to Harlan Stains, MD   Referral placed to pulmonology as patient does not see pulmonary currently.

## 2019-08-17 DIAGNOSIS — G629 Polyneuropathy, unspecified: Secondary | ICD-10-CM | POA: Diagnosis not present

## 2019-08-17 DIAGNOSIS — R5383 Other fatigue: Secondary | ICD-10-CM | POA: Diagnosis not present

## 2019-08-17 DIAGNOSIS — L989 Disorder of the skin and subcutaneous tissue, unspecified: Secondary | ICD-10-CM | POA: Diagnosis not present

## 2019-08-17 NOTE — Telephone Encounter (Signed)
Late Entry -  Spoke with patient on 08/15/2019, results discussed.

## 2019-08-23 ENCOUNTER — Telehealth: Payer: Self-pay | Admitting: *Deleted

## 2019-08-23 NOTE — Telephone Encounter (Signed)
Pulmonology appointment schedule on 10/25/19,left message on voicemail and mailed letter.

## 2019-09-06 ENCOUNTER — Other Ambulatory Visit: Payer: Self-pay | Admitting: Family Medicine

## 2019-09-06 ENCOUNTER — Other Ambulatory Visit: Payer: Self-pay

## 2019-09-06 ENCOUNTER — Ambulatory Visit
Admission: RE | Admit: 2019-09-06 | Discharge: 2019-09-06 | Disposition: A | Discharge status: Home / Self Care | Payer: Medicare PPO | Source: Ambulatory Visit | Attending: Family Medicine | Admitting: Family Medicine

## 2019-09-06 DIAGNOSIS — S8011XA Contusion of right lower leg, initial encounter: Secondary | ICD-10-CM

## 2019-09-12 DIAGNOSIS — B961 Klebsiella pneumoniae [K. pneumoniae] as the cause of diseases classified elsewhere: Secondary | ICD-10-CM | POA: Diagnosis not present

## 2019-09-12 DIAGNOSIS — R8290 Unspecified abnormal findings in urine: Secondary | ICD-10-CM | POA: Diagnosis not present

## 2019-09-12 DIAGNOSIS — N3281 Overactive bladder: Secondary | ICD-10-CM | POA: Diagnosis not present

## 2019-09-12 DIAGNOSIS — R82998 Other abnormal findings in urine: Secondary | ICD-10-CM | POA: Diagnosis not present

## 2019-09-25 DIAGNOSIS — I129 Hypertensive chronic kidney disease with stage 1 through stage 4 chronic kidney disease, or unspecified chronic kidney disease: Secondary | ICD-10-CM | POA: Diagnosis not present

## 2019-09-25 DIAGNOSIS — R202 Paresthesia of skin: Secondary | ICD-10-CM | POA: Diagnosis not present

## 2019-09-25 DIAGNOSIS — Z862 Personal history of diseases of the blood and blood-forming organs and certain disorders involving the immune mechanism: Secondary | ICD-10-CM | POA: Diagnosis not present

## 2019-09-25 DIAGNOSIS — L4050 Arthropathic psoriasis, unspecified: Secondary | ICD-10-CM | POA: Diagnosis not present

## 2019-09-25 DIAGNOSIS — G629 Polyneuropathy, unspecified: Secondary | ICD-10-CM | POA: Diagnosis not present

## 2019-09-25 DIAGNOSIS — N189 Chronic kidney disease, unspecified: Secondary | ICD-10-CM | POA: Diagnosis not present

## 2019-09-25 DIAGNOSIS — Z885 Allergy status to narcotic agent status: Secondary | ICD-10-CM | POA: Diagnosis not present

## 2019-09-25 DIAGNOSIS — M2040 Other hammer toe(s) (acquired), unspecified foot: Secondary | ICD-10-CM | POA: Diagnosis not present

## 2019-09-25 DIAGNOSIS — Z888 Allergy status to other drugs, medicaments and biological substances status: Secondary | ICD-10-CM | POA: Diagnosis not present

## 2019-09-25 DIAGNOSIS — L4052 Psoriatic arthritis mutilans: Secondary | ICD-10-CM | POA: Diagnosis not present

## 2019-09-25 DIAGNOSIS — Z79899 Other long term (current) drug therapy: Secondary | ICD-10-CM | POA: Diagnosis not present

## 2019-09-26 DIAGNOSIS — Z1231 Encounter for screening mammogram for malignant neoplasm of breast: Secondary | ICD-10-CM | POA: Diagnosis not present

## 2019-10-04 ENCOUNTER — Institutional Professional Consult (permissible substitution): Payer: Medicare PPO | Admitting: Pulmonary Disease

## 2019-10-11 ENCOUNTER — Other Ambulatory Visit: Payer: Self-pay

## 2019-10-11 ENCOUNTER — Ambulatory Visit: Payer: Medicare PPO | Admitting: Critical Care Medicine

## 2019-10-11 ENCOUNTER — Encounter: Payer: Self-pay | Admitting: Critical Care Medicine

## 2019-10-11 DIAGNOSIS — J3081 Allergic rhinitis due to animal (cat) (dog) hair and dander: Secondary | ICD-10-CM

## 2019-10-11 DIAGNOSIS — R942 Abnormal results of pulmonary function studies: Secondary | ICD-10-CM

## 2019-10-11 DIAGNOSIS — R918 Other nonspecific abnormal finding of lung field: Secondary | ICD-10-CM

## 2019-10-11 DIAGNOSIS — R000 Tachycardia, unspecified: Secondary | ICD-10-CM | POA: Diagnosis not present

## 2019-10-11 MED ORDER — FLUTICASONE PROPIONATE 50 MCG/ACT NA SUSP: 2.0000 | Freq: Every day | NASAL | 11 refills | Status: DC

## 2019-10-11 MED ORDER — ALBUTEROL SULFATE HFA 108 (90 BASE) MCG/ACT IN AERS: 2.0000 | INHALATION_SPRAY | Freq: Four times a day (QID) | RESPIRATORY_TRACT | 11 refills | Status: DC | PRN

## 2019-10-11 NOTE — Patient Instructions (Addendum)
Thank you for visiting Dr. Carlis Abbott at [facility]. We recommend the following: Orders Placed This Encounter  Procedures  . EKG 12-Lead   Orders Placed This Encounter  Procedures  . EKG 12-Lead    Restart flonase once daily. Make sure your rinse your mouth and gargle after every use.  Meds ordered this encounter  Medications  . albuterol (VENTOLIN HFA) 108 (90 Base) MCG/ACT inhaler    Sig: Inhale 2 puffs into the lungs every 6 (six) hours as needed for shortness of breath.    Dispense:  18 g    Refill:  11  . fluticasone (FLONASE) 50 MCG/ACT nasal spray    Sig: Place 2 sprays into both nostrils daily.    Dispense:  16 g    Refill:  11    Return in about 2 months (around 12/12/2019).    Please do your part to reduce the spread of COVID-19.

## 2019-10-11 NOTE — Progress Notes (Signed)
Synopsis: Referred in July 2021 for SOB by Minus Breeding, MD.  Subjective:   PATIENT ID: [name] [name] GENDER: female DOB: May 14, 1955, MRN: [id number]  Chief Complaint  Patient presents with  . Consult    SOB at times abnormal scan     [name] is a 64 y/o woman who presents for evaluation of shortness of breath.  She has episodes of shortness of breath that happen infrequently, sometimes months apart.  These are associated with fatigue.  She was evaluated by cardiology, who performed an echocardiogram and stress test, which were essentially unchanged.  She had PFTs performed which showed restriction.  She was referred to pulmonary for evaluation.  Her episodes of shortness of breath can last all day.  They are not associated with wheezing, coughing, sputum production, or hemoptysis.  She has no previous history of lung disease and there is no family history of lung disease.  She notices that her allergies have been worse since moving in with her son, who has a dog.  She has had postnasal drip which improved when she was on Flonase, but she stopped it when she developed thrush.  She has never smoked or vaped.  She felt like when she was off Cymbalta her breathing was better, but had to be restarted for her neuropathy.  She was also recently discontinued from amlodipine and has felt better since this was discontinued.  She follows with rheumatology for Biolog[facility] for psoriatic arthritis.  No fever, chills, sweats.  She has had a faster heart rate and palpitations at times, which is new.  She has chronic stable leg edema.  Prior to her evaluation by cardiology she had a CTA performed due to family history of factor V Leiden deficiency and VTE in her mother.  She has no personal history of VTE.  She has been working on losing weight and has lost 67 pounds recently.  She has been focusing on drinking more water and eating a low carbohydrate diet.       Past Medical History:  Diagnosis  Date  . Allergic rhinitis   . Arthritis   . Depression   . Factor V Leiden carrier ([place]) 08/26/2011  . Fatty liver   . Fibroids    dysfuction uterine bleeding  . GERD (gastroesophageal reflux disease)   . Hypertension   . Osteoarthritis   . Peripheral neuropathy   . Psoriatic arthritis ([place])   . Restless leg syndrome      Family History  Problem Relation Age of Onset  . Coronary artery disease Father 24       deceased- TIAs  . Stroke Father   . Hypertension Mother 96       alive  . Deep vein thrombosis Mother   . Neuropathy Mother   . Osteoporosis Mother   . Liver disease Sister   . Other Sister        ITP  . Cancer Sister        breast  . Heart failure Maternal Grandmother      Past Surgical History:  Procedure Laterality Date  . CHOLECYSTECTOMY    . KNEE SURGERY     right TKR  . LUMBAR SPINE SURGERY    . TUBAL LIGATION     bilateral  . URETHRAL SLING    . VAGINAL HYSTERECTOMY      Social History   Socioeconomic History  . Marital status: Widowed    Spouse name: Not on file  . Number of  children: 2  . Years of education: Not on file  . Highest education level: Not on file  Occupational History  . 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 Smoking status: Never Smoker  . Smokeless tobacco: Never Used  Vaping Use  . Vaping Use: Never used  Substance and Sexual Activity  . Alcohol use: No    Alcohol/week: 0.0 standard drinks  . Drug use: No  . Sexual activity: Not on file  Other Topics Concern  . Not on file  Social History Narrative   Lives at home son and two children.  Is retired, has 2 yr college degree.  Has 2 children.  Drinks caffeine.     Social Determinants of Health   Financial Resource Strain:   . Difficulty of Paying Living Expenses:   Food Insecurity:   . Worried About Charity fundraiser in the Last Year:   . Arboriculturist in the Last Year:   Transportation Needs:   . Film/video editor  (Medical):   Marland Kitchen Lack of Transportation (Non-Medical):   Physical Activity:   . Days of Exercise per Week:   . Minutes of Exercise per Session:   Stress:   . Feeling of Stress :   Social Connections:   . Frequency of Communication with Friends and Family:   . Frequency of Social Gatherings with Friends and Family:   . Attends Religious Services:   . Active Member of Clubs or Organizations:   . Attends Archivist Meetings:   Marland Kitchen Marital Status:   Intimate Partner Violence:   . Fear of Current or Ex-Partner:   . Emotionally Abused:   Marland Kitchen Physically Abused:   . Sexually Abused:      Allergies  Allergen Reactions  . Codeine Nausea Only  . Diclofenac Sodium Other (See Comments)    Worsens RLS     Immunization History  Administered Date(s) Administered  . Influenza,inj,Quad PF,6+ Mos 02/04/2018  . Influenza-Unspecified 01/28/2017  . PFIZER SARS-COV-2 Vaccination 05/20/2019, 06/13/2019  . Zoster Recombinat (Shingrix) 12/20/2017, 03/26/2018, 04/24/2018    Outpatient Medications Prior to Visit  Medication Sig Dispense Refill  . acetaminophen (TYLENOL) 325 MG tablet Take 650 mg by mouth every 6 (six) hours as needed for mild pain.    Marland Kitchen ALPRAZolam (XANAX) 0.25 MG tablet     . amLODipine (NORVASC) 5 MG tablet Take 5 mg by mouth daily.     . cetirizine (ZYRTEC) 10 MG tablet Take 10 mg by mouth as needed for allergies.    . clotrimazole-betamethasone (LOTRISONE) cream Apply 1 application topically 2 (two) times daily. 45 g 1  . DULoxetine (CYMBALTA) 60 MG capsule Take 1 capsule (60 mg total) by mouth 2 (two) times daily. 180 capsule 4  . estradiol (ESTRACE) 0.5 MG tablet Take 0.5 mg by mouth daily.    Marland Kitchen gabapentin (NEURONTIN) 600 MG tablet Take 2 tablets in am and pm and 1 tablet at noon. (Patient taking differently: Take 1,200 mg by mouth 2 (two) times daily. ) 150 tablet 11  . gentamicin cream (GARAMYCIN) 0.1 % Apply 1 application topically 2 (two) times daily. 30 g 1  .  losartan-hydrochlorothiazide (HYZAAR) 50-12.5 MG tablet Take 1 tablet by mouth daily.     Marland Kitchen MYRBETRIQ 50 MG TB24 tablet Take 50 mg by mouth daily.     . pantoprazole (PROTONIX) 40 MG tablet Take 40 mg by mouth 2 (two) times daily.     Marland Kitchen  potassium chloride SA (K-DUR) 20 MEQ tablet Take 1 tablet (20 mEq total) by mouth daily. Take one tablet twice a day for five days, then one tablet once a day 35 tablet 0  . rOPINIRole (REQUIP) 2 MG tablet Take 1 tablet (2 mg total) by mouth at bedtime. 90 tablet 4  . traMADol (ULTRAM) 50 MG tablet Take 1-2 tablets (50-100 mg total) by mouth every 6 (six) hours as needed. 60 tablet 0  . doxycycline (VIBRA-TABS) 100 MG tablet Take 1 tablet (100 mg total) by mouth 2 (two) times daily. 20 tablet 0   No facility-administered medications prior to visit.    Review of Systems  Constitutional: Negative for chills and fever.  HENT: Positive for congestion.        Post-nasal drip  Respiratory: Positive for shortness of breath. Negative for cough, hemoptysis, sputum production and wheezing.   Cardiovascular: Positive for leg swelling. Negative for chest pain and palpitations.  Endo/Heme/Allergies: Positive for environmental allergies.     Objective:   Vitals:   10/11/19 1100  BP: 124/64  Pulse: (!) 111  Temp: 97.8 F (36.6 C)  TempSrc: Oral  SpO2: 98%  Weight: 234 lb 6.4 oz (106.3 kg)  Height: 5\' 4"  (1.626 m)   98% on  RA BMI Readings from Last 3 Encounters:  10/11/19 40.23 kg/m  05/25/19 41.10 kg/m  08/01/18 44.11 kg/m   Wt Readings from Last 3 Encounters:  10/11/19 234 lb 6.4 oz (106.3 kg)  05/25/19 232 lb (105.2 kg)  08/01/18 257 lb (116.6 kg)    Physical Exam Vitals reviewed.  Constitutional:      General: She is not in acute distress.    Appearance: Normal appearance. She is obese. She is not ill-appearing.  HENT:     Head: Normocephalic and atraumatic.  Eyes:     General: No scleral icterus. Cardiovascular:     Rate and Rhythm:  Normal rate. Rhythm irregular.     Heart sounds: No murmur heard.   Pulmonary:     Comments: Mild conversational dyspnea and tachypnea. CTAB. Abdominal:     General: There is no distension.     Palpations: Abdomen is soft.  Musculoskeletal:        General: Swelling present. No deformity.     Cervical back: Neck supple.  Lymphadenopathy:     Cervical: No cervical adenopathy.  Skin:    General: Skin is warm and dry.     Findings: No rash.  Neurological:     General: No focal deficit present.     Mental Status: She is alert.     Coordination: Coordination normal.  Psychiatric:        Mood and Affect: Mood normal.        Behavior: Behavior normal.      CBC    Component Value Date/Time   WBC 9.4 08/02/2018 0112   RBC 4.39 08/02/2018 0112   HGB 12.9 08/02/2018 0112   HGB 13.2 08/26/2011 1239   HCT 39.9 08/02/2018 0112   HCT 39.8 08/26/2011 1239   PLT 240 08/02/2018 0112   PLT 195 08/26/2011 1239   MCV 90.9 08/02/2018 0112   MCV 88.3 08/26/2011 1239   MCH 29.4 08/02/2018 0112   MCHC 32.3 08/02/2018 0112   RDW 17.0 (H) 08/02/2018 0112   RDW 15.7 (H) 08/26/2011 1239   LYMPHSABS 2.4 08/02/2018 0112   LYMPHSABS 1.6 08/26/2011 1239   MONOABS 0.8 08/02/2018 0112   MONOABS 0.4 08/26/2011 1239   EOSABS  0.1 08/02/2018 0112   EOSABS 0.1 08/26/2011 1239   BASOSABS 0.1 08/02/2018 0112   BASOSABS 0.0 08/26/2011 1239    CHEMISTRY No results for input(s): NA, K, CL, CO2, GLUCOSE, BUN, CREATININE, CALCIUM, MG, PHOS in the last 168 hours. CrCl cannot be calculated (Patient's most recent lab result is older than the maximum 21 days allowed.).  BNP 44.8 on 06/23/19  Chest Imaging- films reviewed: CT PE 05/19/2019-multiple scattered pulmonary micronodules throughout the lungs.  No parenchymal abnormalities.  Mildly dilated PA, but no reflux of contrast down IVC.  No PE.  No mediastinal or hilar adenopathy.  Pulmonary Functions Testing Results: PFT Results Latest Ref Rng & Units  08/10/2019  FVC-Pre L 2.05  FVC-Predicted Pre % 69  FVC-Post L 2.09  FVC-Predicted Post % 70  Pre FEV1/FVC % % 88  Post FEV1/FCV % % 92  FEV1-Pre L 1.80  FEV1-Predicted Pre % 80  FEV1-Post L 1.91  DLCO UNC% % 80  DLCO COR %Predicted % 106  TLC L 3.61  TLC % Predicted % 74  RV % Predicted % 70   2021- No significant obstruction or bronchodilator reversibility.  Mild restriction with severely reduced ERV.  Normal diffusion.  Flow volume loop with restricted peak expiratory flow.   Echocardiogram 06/12/2019: LVEF 65 to 70%, mild LVH with grade 1 diastolic dysfunction.  Normal LA, RV, RA.  Trivial MR and TR, otherwise normal valves.   EKG: sinus arrhythmia, L anterior fasicular block.  Mild L-axis deviation. T- wave inversions in aVL, delayed R wave progression across precordial's-both present on 05/25/2019 EKG.    Assessment & Plan:     ICD-10-CM   1. Morbid obesity due to excess calories (HCC)  E66.01   2. Restrictive pattern present on pulmonary function testing  R94.2   3. Pulmonary nodules  R91.8   4. Allergic rhinitis due to animal hair and dander  J30.81 fluticasone (FLONASE) 50 MCG/ACT nasal spray  5. Tachycardia  R00.0 EKG 12-Lead    CANCELED: EKG 12-Lead    CANCELED: EKG 12-Lead    Pulmonary restriction due to obesity.  No evidence of parenchymal abnormalities on CT scan. -Recommend weight loss as a long-term goal. She has already lost 6# with dietary changes. -Reviewed her CT scan today which does not show any findings concerning for fibrosis or chronic lung infections associated with immunosuppression.  Episodic SOB, environmental nasal allergies -Flonase 2 sprays bilaterally daily. Rinse and gargle after every use.  May need to trial oral antihistamine if she has a recurrent episode of thrush. -Trial of albuterol PRN when she has episodes of shortness of breath  Multiple pulmonary nodules, all <40mm suggests benign etiology -No additional follow-up required given her  low risk for malignancy (no tobacco or personal cancer history, no family history of lung cancer)  Palpitations and irregular heart rhythm, tachycardia -EKG today-personally reviewed showing sinus arrhythmia with a normal rate.  No additional follow-up required.  RTC  2 months.    Current Outpatient Medications:  .  acetaminophen (TYLENOL) 325 MG tablet, Take 650 mg by mouth every 6 (six) hours as needed for mild pain., Disp: , Rfl:  .  ALPRAZolam (XANAX) 0.25 MG tablet, , Disp: , Rfl:  .  amLODipine (NORVASC) 5 MG tablet, Take 5 mg by mouth daily. , Disp: , Rfl:  .  cetirizine (ZYRTEC) 10 MG tablet, Take 10 mg by mouth as needed for allergies., Disp: , Rfl:  .  clotrimazole-betamethasone (LOTRISONE) cream, Apply 1 application topically 2 (  two) times daily., Disp: 45 g, Rfl: 1 .  DULoxetine (CYMBALTA) 60 MG capsule, Take 1 capsule (60 mg total) by mouth 2 (two) times daily., Disp: 180 capsule, Rfl: 4 .  estradiol (ESTRACE) 0.5 MG tablet, Take 0.5 mg by mouth daily., Disp: , Rfl:  .  gabapentin (NEURONTIN) 600 MG tablet, Take 2 tablets in am and pm and 1 tablet at noon. (Patient taking differently: Take 1,200 mg by mouth 2 (two) times daily. ), Disp: 150 tablet, Rfl: 11 .  gentamicin cream (GARAMYCIN) 0.1 %, Apply 1 application topically 2 (two) times daily., Disp: 30 g, Rfl: 1 .  losartan-hydrochlorothiazide (HYZAAR) 50-12.5 MG tablet, Take 1 tablet by mouth daily. , Disp: , Rfl:  .  MYRBETRIQ 50 MG TB24 tablet, Take 50 mg by mouth daily. , Disp: , Rfl:  .  pantoprazole (PROTONIX) 40 MG tablet, Take 40 mg by mouth 2 (two) times daily. , Disp: , Rfl:  .  potassium chloride SA (K-DUR) 20 MEQ tablet, Take 1 tablet (20 mEq total) by mouth daily. Take one tablet twice a day for five days, then one tablet once a day, Disp: 35 tablet, Rfl: 0 .  rOPINIRole (REQUIP) 2 MG tablet, Take 1 tablet (2 mg total) by mouth at bedtime., Disp: 90 tablet, Rfl: 4 .  traMADol (ULTRAM) 50 MG tablet, Take 1-2 tablets  (50-100 mg total) by mouth every 6 (six) hours as needed., Disp: 60 tablet, Rfl: 0 .  albuterol (VENTOLIN HFA) 108 (90 Base) MCG/ACT inhaler, Inhale 2 puffs into the lungs every 6 (six) hours as needed for shortness of breath., Disp: 18 g, Rfl: 11 .  doxycycline (VIBRA-TABS) 100 MG tablet, Take 1 tablet (100 mg total) by mouth 2 (two) times daily., Disp: 20 tablet, Rfl: 0 .  fluticasone (FLONASE) 50 MCG/ACT nasal spray, Place 2 sprays into both nostrils daily., Disp: 16 g, Rfl: 11   Julian Hy, DO [facility] 10/11/2019 12:02 PM

## 2019-10-19 DIAGNOSIS — N390 Urinary tract infection, site not specified: Secondary | ICD-10-CM | POA: Diagnosis not present

## 2019-10-23 DIAGNOSIS — R002 Palpitations: Secondary | ICD-10-CM | POA: Diagnosis not present

## 2019-10-23 DIAGNOSIS — R300 Dysuria: Secondary | ICD-10-CM | POA: Diagnosis not present

## 2019-10-23 DIAGNOSIS — R102 Pelvic and perineal pain: Secondary | ICD-10-CM | POA: Diagnosis not present

## 2019-10-23 DIAGNOSIS — E538 Deficiency of other specified B group vitamins: Secondary | ICD-10-CM | POA: Diagnosis not present

## 2019-10-25 ENCOUNTER — Telehealth: Payer: Self-pay | Admitting: Critical Care Medicine

## 2019-10-25 ENCOUNTER — Institutional Professional Consult (permissible substitution): Payer: Medicare PPO | Admitting: Pulmonary Disease

## 2019-10-25 MED ORDER — AZELASTINE HCL 0.1 % NA SOLN: 1.0000 | Freq: Two times a day (BID) | NASAL | 5 refills | Status: DC

## 2019-10-25 NOTE — Telephone Encounter (Signed)
Spoke with the pt  She was started on flonase last ov  She states that after starting this she was itching all over her body and developed a "knot" inside her nose  She has since stopped med and no more itching and the knot is going down  She states needing alternative  I added flonase to allergy list and removed from med list  Beth, please advise, thanks!

## 2019-10-25 NOTE — Telephone Encounter (Signed)
Called and had to leave a vm for patient.  Left information that Beth recommended.  Left out call back number for patient for her to call us back.

## 2019-10-25 NOTE — Telephone Encounter (Signed)
Please have her use ocean nasal spray twice daily. If she is already using this and wants alternative try Astelin 1 puff per nostil twice daily

## 2019-10-26 ENCOUNTER — Other Ambulatory Visit: Payer: Self-pay | Admitting: Family Medicine

## 2019-10-26 ENCOUNTER — Ambulatory Visit
Admission: RE | Admit: 2019-10-26 | Discharge: 2019-10-26 | Disposition: A | Discharge status: Home / Self Care | Payer: Medicare PPO | Source: Ambulatory Visit | Attending: Family Medicine | Admitting: Family Medicine

## 2019-10-26 DIAGNOSIS — R109 Unspecified abdominal pain: Secondary | ICD-10-CM

## 2019-10-26 DIAGNOSIS — R509 Fever, unspecified: Secondary | ICD-10-CM | POA: Diagnosis not present

## 2019-10-26 DIAGNOSIS — I700 Atherosclerosis of aorta: Secondary | ICD-10-CM | POA: Diagnosis not present

## 2019-10-26 DIAGNOSIS — N281 Cyst of kidney, acquired: Secondary | ICD-10-CM | POA: Diagnosis not present

## 2019-10-26 DIAGNOSIS — M47816 Spondylosis without myelopathy or radiculopathy, lumbar region: Secondary | ICD-10-CM | POA: Diagnosis not present

## 2019-10-26 MED ORDER — IOPAMIDOL (ISOVUE-300) INJECTION 61%
100.0000 mL | Freq: Once | INTRAVENOUS | Status: AC | PRN
  Administered 2019-10-26: 100 mL via INTRAVENOUS

## 2019-10-30 DIAGNOSIS — E538 Deficiency of other specified B group vitamins: Secondary | ICD-10-CM | POA: Diagnosis not present

## 2019-10-31 DIAGNOSIS — N3281 Overactive bladder: Secondary | ICD-10-CM | POA: Diagnosis not present

## 2019-11-06 DIAGNOSIS — E538 Deficiency of other specified B group vitamins: Secondary | ICD-10-CM | POA: Diagnosis not present

## 2019-11-08 ENCOUNTER — Other Ambulatory Visit: Payer: Self-pay

## 2019-11-08 ENCOUNTER — Emergency Department (HOSPITAL_COMMUNITY)
Admission: EM | Admit: 2019-11-08 | Discharge: 2019-11-09 | Disposition: A | Discharge status: Home / Self Care | Payer: Medicare PPO | Attending: Emergency Medicine | Admitting: Emergency Medicine

## 2019-11-08 DIAGNOSIS — R1031 Right lower quadrant pain: Secondary | ICD-10-CM | POA: Diagnosis present

## 2019-11-08 DIAGNOSIS — N390 Urinary tract infection, site not specified: Secondary | ICD-10-CM | POA: Diagnosis not present

## 2019-11-08 DIAGNOSIS — Z79899 Other long term (current) drug therapy: Secondary | ICD-10-CM | POA: Insufficient documentation

## 2019-11-08 DIAGNOSIS — K219 Gastro-esophageal reflux disease without esophagitis: Secondary | ICD-10-CM | POA: Insufficient documentation

## 2019-11-08 DIAGNOSIS — G629 Polyneuropathy, unspecified: Secondary | ICD-10-CM | POA: Diagnosis not present

## 2019-11-08 DIAGNOSIS — I10 Essential (primary) hypertension: Secondary | ICD-10-CM | POA: Diagnosis not present

## 2019-11-08 DIAGNOSIS — R110 Nausea: Secondary | ICD-10-CM | POA: Diagnosis not present

## 2019-11-08 DIAGNOSIS — G6289 Other specified polyneuropathies: Secondary | ICD-10-CM | POA: Diagnosis not present

## 2019-11-08 DIAGNOSIS — R6883 Chills (without fever): Secondary | ICD-10-CM | POA: Insufficient documentation

## 2019-11-08 DIAGNOSIS — R9431 Abnormal electrocardiogram [ECG] [EKG]: Secondary | ICD-10-CM | POA: Diagnosis not present

## 2019-11-08 LAB — COMPREHENSIVE METABOLIC PANEL
ALT: 16 U/L (ref 0–44)
AST: 17 U/L (ref 15–41)
Albumin: 3.3 g/dL — ABNORMAL LOW (ref 3.5–5.0)
Alkaline Phosphatase: 100 U/L (ref 38–126)
Anion gap: 11 (ref 5–15)
BUN: 13 mg/dL (ref 8–23)
CO2: 22 mmol/L (ref 22–32)
Calcium: 9.3 mg/dL (ref 8.9–10.3)
Chloride: 108 mmol/L (ref 98–111)
Creatinine, Ser: 0.82 mg/dL (ref 0.44–1.00)
GFR calc Af Amer: >60 mL/min (ref >60)
GFR calc non Af Amer: >60 mL/min (ref >60)
Glucose, Bld: 119 mg/dL — ABNORMAL HIGH (ref 70–99)
Potassium: 3.3 mmol/L — ABNORMAL LOW (ref 3.5–5.1)
Sodium: 141 mmol/L (ref 135–145)
Total Bilirubin: 0.6 mg/dL (ref 0.3–1.2)
Total Protein: 6.2 g/dL — ABNORMAL LOW (ref 6.5–8.1)

## 2019-11-08 LAB — CBC
HCT: 37.4 % (ref 36.0–46.0)
Hemoglobin: 11.9 g/dL — ABNORMAL LOW (ref 12.0–15.0)
MCH: 26.7 pg (ref 26.0–34.0)
MCHC: 31.8 g/dL (ref 30.0–36.0)
MCV: 83.9 fL (ref 80.0–100.0)
Platelets: 253 10*3/uL (ref 150–400)
RBC: 4.46 MIL/uL (ref 3.87–5.11)
RDW: 15 % (ref 11.5–15.5)
WBC: 9.5 10*3/uL (ref 4.0–10.5)
nRBC: 0 % (ref 0.0–0.2)

## 2019-11-08 LAB — URINALYSIS, ROUTINE W REFLEX MICROSCOPIC
Bilirubin Urine: NEGATIVE
Glucose, UA: NEGATIVE mg/dL
Hgb urine dipstick: NEGATIVE
Ketones, ur: NEGATIVE mg/dL
Nitrite: POSITIVE — AB
Protein, ur: NEGATIVE mg/dL
Specific Gravity, Urine: 1.017 (ref 1.005–1.030)
pH: 5 (ref 5.0–8.0)

## 2019-11-08 LAB — LIPASE, BLOOD: Lipase: 22 U/L (ref 11–51)

## 2019-11-08 NOTE — ED Triage Notes (Signed)
Pt c/o abdominal pain x 2 weeks. Also c/o SOB and nausea.

## 2019-11-09 MED ORDER — ONDANSETRON 4 MG PO TBDP
4.0000 mg | ORAL_TABLET | Freq: Three times a day (TID) | ORAL | 0 refills | Status: DC | PRN
Start: 2019-11-09 — End: 2021-03-28

## 2019-11-09 MED ORDER — SULFAMETHOXAZOLE-TRIMETHOPRIM 800-160 MG PO TABS
1.0000 | ORAL_TABLET | Freq: Two times a day (BID) | ORAL | 0 refills | Status: AC
Start: 2019-11-09 — End: 2019-11-23

## 2019-11-09 MED ORDER — CEPHALEXIN 250 MG PO CAPS: 500.0000 mg | ORAL_CAPSULE | Freq: Once | ORAL | Status: DC

## 2019-11-09 MED ORDER — ONDANSETRON 4 MG PO TBDP
4.0000 mg | ORAL_TABLET | Freq: Once | ORAL | Status: AC
  Administered 2019-11-09: 4 mg via ORAL
  Filled 2019-11-09: qty 1

## 2019-11-09 MED ORDER — SULFAMETHOXAZOLE-TRIMETHOPRIM 800-160 MG PO TABS
1.0000 | ORAL_TABLET | Freq: Once | ORAL | Status: AC
  Administered 2019-11-09: 1 via ORAL
  Filled 2019-11-09: qty 1

## 2019-11-09 MED ORDER — HYDROCODONE-ACETAMINOPHEN 5-325 MG PO TABS
1.0000 | ORAL_TABLET | Freq: Once | ORAL | Status: AC
  Administered 2019-11-09: 1 via ORAL
  Filled 2019-11-09: qty 1

## 2019-11-09 MED ORDER — HYDROCODONE-ACETAMINOPHEN 5-325 MG PO TABS: 1.0000 | ORAL_TABLET | ORAL | 0 refills | Status: DC | PRN

## 2019-11-09 NOTE — ED Provider Notes (Signed)
[facility] EMERGENCY DEPARTMENT Provider Note   CSN: [id number] Arrival date & time: 11/08/19  2136     History Chief Complaint  Patient presents with  . Abdominal Pain    [name] [name] is a 64 y.o. female.  Patient to ED for evaluation of lower abdominal pain affecting the suprapubic and RLQ abdomen that has been on and off for 2 weeks. She reports the pain was worse yesterday prompting ED evaluation. She has nausea without vomiting. No known fever but reports having chills. She denies urinary frequency or dysuria but also reports having botox injections for bladder strengthening. The pain does not radiate into the back or flank. No hematuria.   The history is provided by the patient. No language interpreter was used.  Abdominal Pain Associated symptoms: chills and nausea   Associated symptoms: no dysuria, no fever, no hematuria and no vomiting        Past Medical History:  Diagnosis Date  . Allergic rhinitis   . Arthritis   . Depression   . Factor V Leiden carrier ([place]) 08/26/2011  . Fatty liver   . Fibroids    dysfuction uterine bleeding  . GERD (gastroesophageal reflux disease)   . Hypertension   . Osteoarthritis   . Peripheral neuropathy   . Psoriatic arthritis ([place])   . Restless leg syndrome     Patient Active Problem List   Diagnosis Date Noted  . Pulmonary hypertension, unspecified ([place]) 05/25/2019  . Educated about COVID-19 virus infection 05/24/2019  . Obstructive hypertrophic cardiomyopathy ([place]) 05/24/2019  . OAB (overactive bladder) 09/29/2017  . OSA (obstructive sleep apnea) 09/29/2017  . Urge urinary incontinence 09/29/2017  . Fibromyalgia 11/11/2016  . Atrial septal hypertrophy 10/21/2015  . Paresthesia of both feet 07/31/2015  . Bradycardia 08/14/2014  . Obesity 10/09/2013  . Chest pain 10/08/2013  . Factor V Leiden carrier ([place]) 08/26/2011  . Protein S deficiency ([place]) 08/26/2011  . MORBID OBESITY 06/06/2010  .  DEPRESSION 06/06/2010  . RESTLESS LEG SYNDROME 06/06/2010  . Essential hypertension 06/06/2010  . ALLERGIC RHINITIS 06/06/2010  . GERD 06/06/2010  . FATTY LIVER DISEASE 06/06/2010  . OSTEOARTHRITIS 06/06/2010  . MYOFASCIAL PAIN SYNDROME 06/06/2010  . EDEMA 06/06/2010  . DYSPNEA 06/06/2010  . CHEST PAIN UNSPECIFIED 06/06/2010    Past Surgical History:  Procedure Laterality Date  . CHOLECYSTECTOMY    . KNEE SURGERY     right TKR  . LUMBAR SPINE SURGERY    . TUBAL LIGATION     bilateral  . URETHRAL SLING    . VAGINAL HYSTERECTOMY       OB History   No obstetric history on file.     Family History  Problem Relation Age of Onset  . Coronary artery disease Father 16       deceased- TIAs  . Stroke Father   . Hypertension Mother 57       alive  . Deep vein thrombosis Mother   . Neuropathy Mother   . Osteoporosis Mother   . Liver disease Sister   . Other Sister        ITP  . Cancer Sister        breast  . Heart failure Maternal Grandmother     Social History   Tobacco Use  . Smoking status: Never Smoker  . Smokeless tobacco: Never Used  Vaping Use  . Vaping Use: Never used  Substance Use Topics  . Alcohol use: No    Alcohol/week: 0.0  standard drinks  . Drug use: No    Home Medications Prior to Admission medications   Medication Sig Start Date End Date Taking? Authorizing Provider  acetaminophen (TYLENOL) 325 MG tablet Take 650 mg by mouth every 6 (six) hours as needed for mild pain.    [provider]  albuterol (VENTOLIN HFA) 108 (90 Base) MCG/ACT inhaler Inhale 2 puffs into the lungs every 6 (six) hours as needed for shortness of breath. 10/11/19   Julian Hy, DO  ALPRAZolam Duanne Moron) 0.25 MG tablet  12/16/18   [provider]  amLODipine (NORVASC) 5 MG tablet Take 5 mg by mouth daily.  07/07/18   [provider]  azelastine (ASTELIN) 0.1 % nasal spray Place 1 spray into both nostrils 2 (two) times daily. Use in each nostril as  directed 10/25/19   Martyn Ehrich, NP  cetirizine (ZYRTEC) 10 MG tablet Take 10 mg by mouth as needed for allergies.    [provider]  clotrimazole-betamethasone (LOTRISONE) cream Apply 1 application topically 2 (two) times daily. 09/14/18   Edrick Kins, DPM  doxycycline (VIBRA-TABS) 100 MG tablet Take 1 tablet (100 mg total) by mouth 2 (two) times daily. 10/12/18   Edrick Kins, DPM  DULoxetine (CYMBALTA) 60 MG capsule Take 1 capsule (60 mg total) by mouth 2 (two) times daily. 05/02/18   Lomax, Amy, NP  estradiol (ESTRACE) 0.5 MG tablet Take 0.5 mg by mouth daily.    [provider]  gabapentin (NEURONTIN) 600 MG tablet Take 2 tablets in am and pm and 1 tablet at noon. Patient taking differently: Take 1,200 mg by mouth 2 (two) times daily.  05/24/18   Kathrynn Ducking, MD  gentamicin cream (GARAMYCIN) 0.1 % Apply 1 application topically 2 (two) times daily. 08/08/18   Edrick Kins, DPM  losartan-hydrochlorothiazide (HYZAAR) 50-12.5 MG tablet Take 1 tablet by mouth daily.  07/07/18   [provider]  MYRBETRIQ 50 MG TB24 tablet Take 50 mg by mouth daily.  03/29/17   [provider]  pantoprazole (PROTONIX) 40 MG tablet Take 40 mg by mouth 2 (two) times daily.     [provider]  potassium chloride SA (K-DUR) 20 MEQ tablet Take 1 tablet (20 mEq total) by mouth daily. Take one tablet twice a day for five days, then one tablet once a day 04/04/08   Delora Fuel, MD  rOPINIRole (REQUIP) 2 MG tablet Take 1 tablet (2 mg total) by mouth at bedtime. 05/02/18 08/02/27  Lomax, Amy, NP  traMADol (ULTRAM) 50 MG tablet Take 1-2 tablets (50-100 mg total) by mouth every 6 (six) hours as needed. 11/07/18   Edrick Kins, DPM    Allergies    Codeine, Diclofenac sodium, and Flonase [fluticasone]  Review of Systems   Review of Systems  Constitutional: Positive for appetite change and chills. Negative for fever.  HENT: Negative.   Respiratory: Negative.    Cardiovascular: Negative.   Gastrointestinal: Positive for abdominal pain and nausea. Negative for vomiting.  Genitourinary: Negative for dysuria, flank pain, frequency and hematuria.  Musculoskeletal: Negative.   Skin: Negative.   Neurological: Negative.     Physical Exam Updated Vital Signs BP 136/80 (BP Location: Left Arm)   Pulse (!) 59   Temp 98.3 F (36.8 C) (Oral)   Resp 17   Ht 5\' 4"  (1.626 m)   Wt 105.2 kg   SpO2 96%   BMI 39.82 kg/m   Physical Exam Vitals and nursing note  reviewed.  Constitutional:      Appearance: She is well-developed.  HENT:     Head: Normocephalic.  Cardiovascular:     Rate and Rhythm: Normal rate and regular rhythm.  Pulmonary:     Effort: Pulmonary effort is normal.     Breath sounds: Normal breath sounds.  Abdominal:     General: Bowel sounds are normal.     Palpations: Abdomen is soft.     Tenderness: There is abdominal tenderness in the right lower quadrant and suprapubic area. There is no right CVA tenderness, guarding or rebound.  Musculoskeletal:        General: Normal range of motion.     Cervical back: Normal range of motion and neck supple.  Skin:    General: Skin is warm and dry.     Findings: No rash.  Neurological:     Mental Status: She is alert.     Cranial Nerves: No cranial nerve deficit.     ED Results / Procedures / Treatments   Labs (all labs ordered are listed, but only abnormal results are displayed) Labs Reviewed  COMPREHENSIVE METABOLIC PANEL - Abnormal; Notable for the following components:      Result Value   Potassium 3.3 (*)    Glucose, Bld 119 (*)    Total Protein 6.2 (*)    Albumin 3.3 (*)    All other components within normal limits  CBC - Abnormal; Notable for the following components:   Hemoglobin 11.9 (*)    All other components within normal limits  URINALYSIS, ROUTINE W REFLEX MICROSCOPIC - Abnormal; Notable for the following components:   APPearance HAZY (*)    Nitrite POSITIVE (*)     Leukocytes,Ua TRACE (*)    Bacteria, UA MANY (*)    All other components within normal limits  LIPASE, BLOOD    EKG None  Radiology No results found.  Procedures Procedures (including critical care time)  Medications Ordered in ED Medications  HYDROcodone-acetaminophen (NORCO/VICODIN) 5-325 MG per tablet 1 tablet (1 tablet Oral Given 11/09/19 0643)  ondansetron (ZOFRAN-ODT) disintegrating tablet 4 mg (4 mg Oral Given 11/09/19 7209)  sulfamethoxazole-trimethoprim (BACTRIM DS) 800-160 MG per tablet 1 tablet (1 tablet Oral Given 11/09/19 4709)    ED Course  I have reviewed the triage vital signs and the nursing notes.  Pertinent labs & imaging results that were available during my care of the patient were reviewed by me and considered in my medical decision making (see chart for details).    MDM Rules/Calculators/A&P                          Patient to ED for abdominal pain, nausea, chills x 2 weeks.   UA suggests UTI. When this was discussed with the patient she reports she has had 3 "back-to-back" UTI's, finishing her last abx 1 week ago. Labs otherwise unremarkable. VSS.   Will culture urine and treat with bactrim for 14 days. Discussed following up with her doctor for review of the urine cultures in 48-72 hours.   Final Clinical Impression(s) / ED Diagnoses Final diagnoses:  None   1. UTI, recurrent  Rx / DC Orders ED Discharge Orders    None       Charlann Lange, PA-C 11/09/19 0654    Merryl Hacker, MD 11/09/19 [phone]

## 2019-11-09 NOTE — Discharge Instructions (Addendum)
Please follow up with Dr. Dema Severin in 2-3 days to review the urine culture. Take medications as prescribed. Return to the emergency department with any high fever, severe pain, uncontrolled vomiting or new concern.

## 2019-11-13 DIAGNOSIS — E538 Deficiency of other specified B group vitamins: Secondary | ICD-10-CM | POA: Diagnosis not present

## 2019-11-15 DIAGNOSIS — L409 Psoriasis, unspecified: Secondary | ICD-10-CM | POA: Diagnosis not present

## 2019-11-15 DIAGNOSIS — M7918 Myalgia, other site: Secondary | ICD-10-CM | POA: Diagnosis not present

## 2019-11-15 DIAGNOSIS — Z7189 Other specified counseling: Secondary | ICD-10-CM | POA: Diagnosis not present

## 2019-11-15 DIAGNOSIS — M150 Primary generalized (osteo)arthritis: Secondary | ICD-10-CM | POA: Diagnosis not present

## 2019-11-15 DIAGNOSIS — L4050 Arthropathic psoriasis, unspecified: Secondary | ICD-10-CM | POA: Diagnosis not present

## 2019-11-15 DIAGNOSIS — L97522 Non-pressure chronic ulcer of other part of left foot with fat layer exposed: Secondary | ICD-10-CM | POA: Diagnosis not present

## 2019-11-15 DIAGNOSIS — M2550 Pain in unspecified joint: Secondary | ICD-10-CM | POA: Diagnosis not present

## 2019-11-15 DIAGNOSIS — N3000 Acute cystitis without hematuria: Secondary | ICD-10-CM | POA: Diagnosis not present

## 2019-11-15 DIAGNOSIS — Z79899 Other long term (current) drug therapy: Secondary | ICD-10-CM | POA: Diagnosis not present

## 2019-11-20 DIAGNOSIS — N9489 Other specified conditions associated with female genital organs and menstrual cycle: Secondary | ICD-10-CM | POA: Diagnosis not present

## 2019-11-20 DIAGNOSIS — N816 Rectocele: Secondary | ICD-10-CM | POA: Diagnosis not present

## 2019-11-20 DIAGNOSIS — R300 Dysuria: Secondary | ICD-10-CM | POA: Diagnosis not present

## 2019-11-20 DIAGNOSIS — N3281 Overactive bladder: Secondary | ICD-10-CM | POA: Diagnosis not present

## 2019-12-06 ENCOUNTER — Emergency Department (HOSPITAL_COMMUNITY)
Admission: EM | Admit: 2019-12-06 | Discharge: 2019-12-06 | Discharge status: Against Medical Advice | Payer: Medicare PPO

## 2019-12-06 ENCOUNTER — Other Ambulatory Visit: Payer: Self-pay

## 2019-12-07 DIAGNOSIS — B373 Candidiasis of vulva and vagina: Secondary | ICD-10-CM | POA: Diagnosis not present

## 2019-12-15 ENCOUNTER — Other Ambulatory Visit: Payer: Self-pay

## 2019-12-15 ENCOUNTER — Ambulatory Visit: Payer: Medicare PPO | Admitting: Critical Care Medicine

## 2019-12-15 ENCOUNTER — Encounter: Payer: Self-pay | Admitting: Critical Care Medicine

## 2019-12-15 VITALS — BP 130/70 | HR 58 | Temp 97.2°F | Ht 64.0 in | Wt 231.0 lb

## 2019-12-15 DIAGNOSIS — R0609 Other forms of dyspnea: Secondary | ICD-10-CM

## 2019-12-15 DIAGNOSIS — J309 Allergic rhinitis, unspecified: Secondary | ICD-10-CM | POA: Diagnosis not present

## 2019-12-15 DIAGNOSIS — Z23 Encounter for immunization: Secondary | ICD-10-CM

## 2019-12-15 DIAGNOSIS — R0600 Dyspnea, unspecified: Secondary | ICD-10-CM | POA: Diagnosis not present

## 2019-12-15 NOTE — Progress Notes (Signed)
Synopsis: Referred in July 2021 for SOB by Harlan Stains, MD.  Subjective:   PATIENT ID: [name] [name] GENDER: female DOB: 1955/10/11, MRN: [id number]  Chief Complaint  Patient presents with  . Follow-up    Patient has had UTI for quiet awhile and is just starting to get over that but thinks that she is having a reaction to the nasal spray she states that she was having a hard time functioning after using it. Shortness of breath has got better since last visit. Denies cough     [name] [name] is a 65 year old woman with a history of episodic shortness of breath who presents for follow-up.  She has not had a significant shortness of breath.  She tried her inhaler one time with perceived benefit, but she stopped what she was doing and went inside to rest.  She denies cough.  For her allergic rhinosinusitis she was intolerant to Flonase and has recently had to stop taking azelastine due to feeling very fatigued during the day.  She was taking this only in the morning.  She felt she could barely get out of bed.  She has tried staying off of it for 2 days with improvement in her symptoms.  She has had multiple recent UTIs treated with antibiotics, but is finally feeling better.     OV 10/11/19: [name] [name] is a 63 y/o woman who presents for evaluation of shortness of breath.  She has episodes of shortness of breath that happen infrequently, sometimes months apart.  These are associated with fatigue.  She was evaluated by cardiology, who performed an echocardiogram and stress test, which were essentially unchanged.  She had PFTs performed which showed restriction.  She was referred to pulmonary for evaluation.  Her episodes of shortness of breath can last all day.  They are not associated with wheezing, coughing, sputum production, or hemoptysis.  She has no previous history of lung disease and there is no family history of lung disease.  She notices that her allergies have been worse since moving in  with her son, who has a dog.  She has had postnasal drip which improved when she was on Flonase, but she stopped it when she developed thrush.  She has never smoked or vaped.  She felt like when she was off Cymbalta her breathing was better, but had to be restarted for her neuropathy.  She was also recently discontinued from amlodipine and has felt better since this was discontinued.  She follows with rheumatology for [facility] for psoriatic arthritis.  No fever, chills, sweats.  She has had a faster heart rate and palpitations at times, which is new.  She has chronic stable leg edema.  Prior to her evaluation by cardiology she had a CTA performed due to family history of factor V Leiden deficiency and VTE in her mother.  She has no personal history of VTE.  She has been working on losing weight and has lost 67 pounds recently.  She has been focusing on drinking more water and eating a low carbohydrate diet.      Past Medical History:  Diagnosis Date  . Allergic rhinitis   . Arthritis   . Depression   . Factor V Leiden carrier ([place]) 08/26/2011  . Fatty liver   . Fibroids    dysfuction uterine bleeding  . GERD (gastroesophageal reflux disease)   . Hypertension   . Osteoarthritis   . Peripheral neuropathy   . Psoriatic arthritis ([place])   . Restless  leg syndrome      Family History  Problem Relation Age of Onset  . Coronary artery disease Father 84       deceased- TIAs  . Stroke Father   . Hypertension Mother 68       alive  . Deep vein thrombosis Mother   . Neuropathy Mother   . Osteoporosis Mother   . Liver disease Sister   . Other Sister        ITP  . Cancer Sister        breast  . Heart failure Maternal Grandmother      Past Surgical History:  Procedure Laterality Date  . CHOLECYSTECTOMY    . KNEE SURGERY     right TKR  . LUMBAR SPINE SURGERY    . TUBAL LIGATION     bilateral  . URETHRAL SLING    . VAGINAL HYSTERECTOMY      Social History   Socioeconomic  History  . Marital status: Widowed    Spouse name: Not on file  . Number of children: 2  . Years of education: Not on file  . Highest education level: Not on file  Occupational History  . 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 Smoking status: Never Smoker  . Smokeless tobacco: Never Used  Vaping Use  . Vaping Use: Never used  Substance and Sexual Activity  . Alcohol use: No    Alcohol/week: 0.0 standard drinks  . Drug use: No  . Sexual activity: Not on file  Other Topics Concern  . Not on file  Social History Narrative   Lives at home son and two children.  Is retired, has 2 yr college degree.  Has 2 children.  Drinks caffeine.     Social Determinants of Health   Financial Resource Strain:   . Difficulty of Paying Living Expenses: Not on file  Food Insecurity:   . Worried About Charity fundraiser in the Last Year: Not on file  . Ran Out of Food in the Last Year: Not on file  Transportation Needs:   . Lack of Transportation (Medical): Not on file  . Lack of Transportation (Non-Medical): Not on file  Physical Activity:   . Days of Exercise per Week: Not on file  . Minutes of Exercise per Session: Not on file  Stress:   . Feeling of Stress : Not on file  Social Connections:   . Frequency of Communication with Friends and Family: Not on file  . Frequency of Social Gatherings with Friends and Family: Not on file  . Attends Religious Services: Not on file  . Active Member of Clubs or Organizations: Not on file  . Attends Archivist Meetings: Not on file  . Marital Status: Not on file  Intimate Partner Violence:   . Fear of Current or Ex-Partner: Not on file  . Emotionally Abused: Not on file  . Physically Abused: Not on file  . Sexually Abused: Not on file     Allergies  Allergen Reactions  . Codeine Nausea Only  . Diclofenac Sodium Other (See Comments)    Worsens RLS  . Flonase [Fluticasone] Itching      Immunization History  Administered Date(s) Administered  . Influenza,inj,Quad PF,6+ Mos 02/04/2018  . Influenza-Unspecified 01/28/2017  . PFIZER SARS-COV-2 Vaccination 05/20/2019, 06/13/2019  . Zoster Recombinat (Shingrix) 12/20/2017, 03/26/2018, 04/24/2018    Outpatient Medications Prior to Visit  Medication Sig Dispense Refill  .  acetaminophen (TYLENOL) 325 MG tablet Take 650 mg by mouth every 6 (six) hours as needed for mild pain.    Marland Kitchen albuterol (VENTOLIN HFA) 108 (90 Base) MCG/ACT inhaler Inhale 2 puffs into the lungs every 6 (six) hours as needed for shortness of breath. 18 g 11  . ALPRAZolam (XANAX) 0.25 MG tablet     . azelastine (ASTELIN) 0.1 % nasal spray Place 1 spray into both nostrils 2 (two) times daily. Use in each nostril as directed 30 mL 5  . cetirizine (ZYRTEC) 10 MG tablet Take 10 mg by mouth as needed for allergies.    . DULoxetine (CYMBALTA) 60 MG capsule Take 1 capsule (60 mg total) by mouth 2 (two) times daily. 180 capsule 4  . gabapentin (NEURONTIN) 600 MG tablet Take 2 tablets in am and pm and 1 tablet at noon. (Patient taking differently: Take 1,200 mg by mouth 2 (two) times daily. ) 150 tablet 11  . HYDROcodone-acetaminophen (NORCO/VICODIN) 5-325 MG tablet Take 1 tablet by mouth every 4 (four) hours as needed. 8 tablet 0  . losartan-hydrochlorothiazide (HYZAAR) 50-12.5 MG tablet Take 1 tablet by mouth daily.     Marland Kitchen MYRBETRIQ 50 MG TB24 tablet Take 50 mg by mouth daily.     . ondansetron (ZOFRAN ODT) 4 MG disintegrating tablet Take 1 tablet (4 mg total) by mouth every 8 (eight) hours as needed for nausea or vomiting. 20 tablet 0  . pantoprazole (PROTONIX) 40 MG tablet Take 40 mg by mouth 2 (two) times daily.     . potassium chloride SA (K-DUR) 20 MEQ tablet Take 1 tablet (20 mEq total) by mouth daily. Take one tablet twice a day for five days, then one tablet once a day 35 tablet 0  . rOPINIRole (REQUIP) 2 MG tablet Take 1 tablet (2 mg total) by mouth at bedtime.  90 tablet 4  . traMADol (ULTRAM) 50 MG tablet Take 1-2 tablets (50-100 mg total) by mouth every 6 (six) hours as needed. 60 tablet 0  . amLODipine (NORVASC) 5 MG tablet Take 5 mg by mouth daily.     . clotrimazole-betamethasone (LOTRISONE) cream Apply 1 application topically 2 (two) times daily. 45 g 1  . doxycycline (VIBRA-TABS) 100 MG tablet Take 1 tablet (100 mg total) by mouth 2 (two) times daily. 20 tablet 0  . estradiol (ESTRACE) 0.5 MG tablet Take 0.5 mg by mouth daily.    Marland Kitchen gentamicin cream (GARAMYCIN) 0.1 % Apply 1 application topically 2 (two) times daily. 30 g 1   No facility-administered medications prior to visit.    Review of Systems  Constitutional: Negative for chills and fever.  HENT: Positive for congestion.        Post-nasal drip  Respiratory: Positive for shortness of breath. Negative for cough, hemoptysis, sputum production and wheezing.   Cardiovascular: Positive for leg swelling. Negative for chest pain and palpitations.  Endo/Heme/Allergies: Positive for environmental allergies.     Objective:   Vitals:   12/15/19 0921  BP: 130/70  Pulse: (!) 58  Temp: (!) 97.2 F (36.2 C)  TempSrc: Temporal  SpO2: 99%  Weight: 231 lb (104.8 kg)  Height: 5\' 4"  (1.626 m)   99% on  RA BMI Readings from Last 3 Encounters:  12/15/19 39.65 kg/m  11/08/19 39.82 kg/m  10/11/19 40.23 kg/m   Wt Readings from Last 3 Encounters:  12/15/19 231 lb (104.8 kg)  11/08/19 232 lb (105.2 kg)  10/11/19 234 lb 6.4 oz (106.3 kg)  Physical Exam Vitals reviewed.  Constitutional:      General: She is not in acute distress.    Appearance: She is obese. She is not ill-appearing.  HENT:     Head: Normocephalic and atraumatic.  Cardiovascular:     Rate and Rhythm: Normal rate and regular rhythm.     Heart sounds: No murmur heard.   Pulmonary:     Comments: CTAB, no conversational dyspnea. Musculoskeletal:        General: No deformity.     Cervical back: Neck supple.   Lymphadenopathy:     Cervical: No cervical adenopathy.  Skin:    General: Skin is warm and dry.     Findings: No rash.  Neurological:     General: No focal deficit present.     Mental Status: She is alert.     Coordination: Coordination normal.  Psychiatric:        Mood and Affect: Mood normal.        Behavior: Behavior normal.      CBC    Component Value Date/Time   WBC 9.5 11/08/2019 2157   RBC 4.46 11/08/2019 2157   HGB 11.9 (L) 11/08/2019 2157   HGB 13.2 08/26/2011 1239   HCT 37.4 11/08/2019 2157   HCT 39.8 08/26/2011 1239   PLT 253 11/08/2019 2157   PLT 195 08/26/2011 1239   MCV 83.9 11/08/2019 2157   MCV 88.3 08/26/2011 1239   MCH 26.7 11/08/2019 2157   MCHC 31.8 11/08/2019 2157   RDW 15.0 11/08/2019 2157   RDW 15.7 (H) 08/26/2011 1239   LYMPHSABS 2.4 08/02/2018 0112   LYMPHSABS 1.6 08/26/2011 1239   MONOABS 0.8 08/02/2018 0112   MONOABS 0.4 08/26/2011 1239   EOSABS 0.1 08/02/2018 0112   EOSABS 0.1 08/26/2011 1239   BASOSABS 0.1 08/02/2018 0112   BASOSABS 0.0 08/26/2011 1239    CHEMISTRY No results for input(s): NA, K, CL, CO2, GLUCOSE, BUN, CREATININE, CALCIUM, MG, PHOS in the last 168 hours. CrCl cannot be calculated (Patient's most recent lab result is older than the maximum 21 days allowed.).  BNP 44.8 on 06/23/19  Chest Imaging- films reviewed: CT PE 05/19/2019-multiple scattered pulmonary micronodules throughout the lungs.  No parenchymal abnormalities.  Mildly dilated PA, but no reflux of contrast down IVC.  No PE.  No mediastinal or hilar adenopathy.  CT abdomen pelvis 10/26/2019-lung images reviewed.  Persistent pulmonary nodules, unchanged in appearance.  Pulmonary Functions Testing Results: PFT Results Latest Ref Rng & Units 08/10/2019  FVC-Pre L 2.05  FVC-Predicted Pre % 69  FVC-Post L 2.09  FVC-Predicted Post % 70  Pre FEV1/FVC % % 88  Post FEV1/FCV % % 92  FEV1-Pre L 1.80  FEV1-Predicted Pre % 80  FEV1-Post L 1.91  DLCO uncorrected  ml/min/mmHg 15.09  DLCO UNC% % 80  DLVA Predicted % 106  TLC L 3.61  TLC % Predicted % 74  RV % Predicted % 70   2021- No significant obstruction or bronchodilator reversibility.  Mild restriction with severely reduced ERV.  Normal diffusion.  Flow volume loop with restricted peak expiratory flow.   Echocardiogram 06/12/2019: LVEF 65 to 70%, mild LVH with grade 1 diastolic dysfunction.  Normal LA, RV, RA.  Trivial MR and TR, otherwise normal valves.   EKG: sinus arrhythmia, L anterior fasicular block.  Mild L-axis deviation. T- wave inversions in aVL, delayed R wave progression across precordial's-both present on 05/25/2019 EKG.    Assessment & Plan:     ICD-10-CM  1. Allergic rhinitis, unspecified seasonality, unspecified trigger  J30.9   2. DOE (dyspnea on exertion)  R06.00     Pulmonary restriction due to obesity.  No evidence of parenchymal abnormalities on CT scan. -Recommend resuming her efforts at weight loss. She understands this is a long-term goal will require combination of regular physical activity and dietary changes. -Previous CT scan did not demonstrate fibrosis.  Episodic SOB, environmental nasal allergies -Continue azelastine nasal spray once daily-recommend using at night to avoid excessive fatigue -Okay to continue nasal saline rinses. -Anticipate that she would have similar symptoms with oral antihistamines. -Okay to continue albuterol PRN when she has episodes of shortness of breath -Flu vaccine today.  Up-to-date on Covid vaccines.  Recommend pneumonia vaccines.  Multiple pulmonary nodules, all <106mm suggests benign etiology -No additional follow-up required given her low risk for malignancy (no tobacco or personal cancer history, no family history of lung cancer)    RTC  2 months.  40 minutes spent on this encounter, including greater than 50% spent face-to-face.   Current Outpatient Medications:  .  acetaminophen (TYLENOL) 325 MG tablet, Take 650 mg by  mouth every 6 (six) hours as needed for mild pain., Disp: , Rfl:  .  albuterol (VENTOLIN HFA) 108 (90 Base) MCG/ACT inhaler, Inhale 2 puffs into the lungs every 6 (six) hours as needed for shortness of breath., Disp: 18 g, Rfl: 11 .  ALPRAZolam (XANAX) 0.25 MG tablet, , Disp: , Rfl:  .  azelastine (ASTELIN) 0.1 % nasal spray, Place 1 spray into both nostrils 2 (two) times daily. Use in each nostril as directed, Disp: 30 mL, Rfl: 5 .  cetirizine (ZYRTEC) 10 MG tablet, Take 10 mg by mouth as needed for allergies., Disp: , Rfl:  .  DULoxetine (CYMBALTA) 60 MG capsule, Take 1 capsule (60 mg total) by mouth 2 (two) times daily., Disp: 180 capsule, Rfl: 4 .  gabapentin (NEURONTIN) 600 MG tablet, Take 2 tablets in am and pm and 1 tablet at noon. (Patient taking differently: Take 1,200 mg by mouth 2 (two) times daily. ), Disp: 150 tablet, Rfl: 11 .  HYDROcodone-acetaminophen (NORCO/VICODIN) 5-325 MG tablet, Take 1 tablet by mouth every 4 (four) hours as needed., Disp: 8 tablet, Rfl: 0 .  losartan-hydrochlorothiazide (HYZAAR) 50-12.5 MG tablet, Take 1 tablet by mouth daily. , Disp: , Rfl:  .  MYRBETRIQ 50 MG TB24 tablet, Take 50 mg by mouth daily. , Disp: , Rfl:  .  ondansetron (ZOFRAN ODT) 4 MG disintegrating tablet, Take 1 tablet (4 mg total) by mouth every 8 (eight) hours as needed for nausea or vomiting., Disp: 20 tablet, Rfl: 0 .  pantoprazole (PROTONIX) 40 MG tablet, Take 40 mg by mouth 2 (two) times daily. , Disp: , Rfl:  .  potassium chloride SA (K-DUR) 20 MEQ tablet, Take 1 tablet (20 mEq total) by mouth daily. Take one tablet twice a day for five days, then one tablet once a day, Disp: 35 tablet, Rfl: 0 .  rOPINIRole (REQUIP) 2 MG tablet, Take 1 tablet (2 mg total) by mouth at bedtime., Disp: 90 tablet, Rfl: 4 .  traMADol (ULTRAM) 50 MG tablet, Take 1-2 tablets (50-100 mg total) by mouth every 6 (six) hours as needed., Disp: 60 tablet, Rfl: 0   Julian Hy, DO [facility] 12/15/2019 9:37 AM

## 2019-12-15 NOTE — Patient Instructions (Addendum)
Thank you for visiting Dr. Carlis Abbott at [facility]. We recommend the following:  Keep using albuterol as needed for shortness of breath. Switch to using azelastine nasal spray at night to help with fatigue.    Return if symptoms worsen or fail to improve.    Please do your part to reduce the spread of COVID-19.

## 2019-12-18 DIAGNOSIS — G629 Polyneuropathy, unspecified: Secondary | ICD-10-CM | POA: Diagnosis not present

## 2019-12-18 DIAGNOSIS — M79672 Pain in left foot: Secondary | ICD-10-CM | POA: Diagnosis not present

## 2019-12-18 DIAGNOSIS — G2581 Restless legs syndrome: Secondary | ICD-10-CM | POA: Diagnosis not present

## 2019-12-18 DIAGNOSIS — R5382 Chronic fatigue, unspecified: Secondary | ICD-10-CM | POA: Diagnosis not present

## 2019-12-18 DIAGNOSIS — M79671 Pain in right foot: Secondary | ICD-10-CM | POA: Diagnosis not present

## 2019-12-18 DIAGNOSIS — N3281 Overactive bladder: Secondary | ICD-10-CM | POA: Diagnosis not present

## 2019-12-18 DIAGNOSIS — K219 Gastro-esophageal reflux disease without esophagitis: Secondary | ICD-10-CM | POA: Diagnosis not present

## 2019-12-18 DIAGNOSIS — I10 Essential (primary) hypertension: Secondary | ICD-10-CM | POA: Diagnosis not present

## 2019-12-18 DIAGNOSIS — E538 Deficiency of other specified B group vitamins: Secondary | ICD-10-CM | POA: Diagnosis not present

## 2020-01-03 DIAGNOSIS — M5441 Lumbago with sciatica, right side: Secondary | ICD-10-CM | POA: Diagnosis not present

## 2020-01-08 ENCOUNTER — Ambulatory Visit: Payer: Medicare PPO | Admitting: Orthopedic Surgery

## 2020-01-22 ENCOUNTER — Other Ambulatory Visit: Payer: Self-pay

## 2020-01-22 ENCOUNTER — Ambulatory Visit (INDEPENDENT_AMBULATORY_CARE_PROVIDER_SITE_OTHER): Payer: Medicare PPO

## 2020-01-22 ENCOUNTER — Ambulatory Visit: Payer: Medicare PPO | Admitting: Orthopedic Surgery

## 2020-01-22 ENCOUNTER — Encounter: Payer: Self-pay | Admitting: Orthopedic Surgery

## 2020-01-22 ENCOUNTER — Ambulatory Visit: Payer: Self-pay

## 2020-01-22 ENCOUNTER — Ambulatory Visit (INDEPENDENT_AMBULATORY_CARE_PROVIDER_SITE_OTHER): Payer: Medicare PPO | Admitting: Orthopedic Surgery

## 2020-01-22 VITALS — Ht 64.0 in | Wt 230.0 lb

## 2020-01-22 DIAGNOSIS — M79671 Pain in right foot: Secondary | ICD-10-CM | POA: Diagnosis not present

## 2020-01-22 DIAGNOSIS — M79672 Pain in left foot: Secondary | ICD-10-CM

## 2020-01-22 NOTE — Progress Notes (Signed)
Office Visit Note   Patient: [name] [name]           Date of Birth: 1956-03-07           MRN: [id number] Visit Date: 01/22/2020              Requested by: Harlan Stains, MD [place],  [place] [address] PCP: Harlan Stains, MD  Chief Complaint  Patient presents with  . Right Foot - Pain  . Left Foot - Pain      HPI: Patient is a 64 year old woman who is seen for initial evaluation for bilateral foot pain right worse than left across the midfoot.  Patient states she has had pain for years but is getting much worse.  Patient states she does have neuropathy just started Lyrica which helps she states she is not diabetic.  She states she underwent the fifth metatarsal head resection of the left foot with Dr. Amalia Hailey last year.  Patient states she has a history of psoriatic arthritis.  Assessment & Plan: Visit Diagnoses:  1. Bilateral foot pain     Plan: Recommended a stiff soled cushioned sneaker to unload pressure from the midfoot.  Discussed that if we cannot resolve her symptoms with shoewear modifications a surgical option would be to proceed with midfoot fusion and a dorsiflexion osteotomy of the first metatarsal.  We will follow-up as needed.  Follow-Up Instructions: No follow-ups on file.   Ortho Exam  Patient is alert, oriented, no adenopathy, well-dressed, normal affect, normal respiratory effort. Examination patient has clawing of the lesser toes bilaterally she has dorsiflexion 10 degrees past neutral with her knee extended she has a good dorsalis pedis pulse bilaterally she has good subtalar motion.  There is no redness or cellulitis around the fifth metatarsal head resection.  She does have a callus beneath the first metatarsal head bilaterally worse on the right than the left with a plantarflexed first ray and a cavus hindfoot.  She has palpable spurring across the midfoot and this reproduces her pain with palpation worse on the right than the  left.  Imaging: XR Foot Complete Left  Result Date: 01/22/2020 Three-view radiographs of the left foot shows arthritic changes through the midfoot with less bony spurs than the right foot.  She is status post fifth metatarsal head excision on the left foot.  XR Foot Complete Right  Result Date: 01/22/2020 Three-view radiographs of the right foot shows large osteophytic bone spurs and joint space collapse across the midfoot including the talus navicular and medial cuneiform joints.  No images are attached to the encounter.  Labs: Lab Results  Component Value Date   REPTSTATUS 01/11/2007 FINAL 01/09/2007   CULT KLEBSIELLA PNEUMONIAE 01/09/2007   LABORGA KLEBSIELLA PNEUMONIAE 01/09/2007     Lab Results  Component Value Date   ALBUMIN 3.3 (L) 11/08/2019   ALBUMIN 3.2 (L) 10/12/2012   ALBUMIN 3.7 07/31/2010   ALBUMIN 3.7 07/31/2010    No results found for: MG No results found for: VD25OH  No results found for: PREALBUMIN CBC EXTENDED Latest Ref Rng & Units 11/08/2019 08/02/2018 10/09/2013  WBC 4.0 - 10.5 K/uL 9.5 9.4 10.0  RBC 3.87 - 5.11 MIL/uL 4.46 4.39 4.43  HGB 12.0 - 15.0 g/dL 11.9(L) 12.9 12.9  HCT 36 - 46 % 37.4 39.9 38.8  PLT 150 - 400 K/uL 253 240 177  NEUTROABS 1.7 - 7.7 K/uL - 5.9 -  LYMPHSABS 0.7 - 4.0 K/uL - 2.4 -  Body mass index is 39.48 kg/m.  Orders:  Orders Placed This Encounter  Procedures  . XR Foot Complete Left  . XR Foot Complete Right   No orders of the defined types were placed in this encounter.    Procedures: No procedures performed  Clinical Data: No additional findings.  ROS:  All other systems negative, except as noted in the HPI. Review of Systems  Objective: Vital Signs: Ht 5\' 4"  (6.045 m)   Wt 230 lb (104.3 kg)   BMI 39.48 kg/m   Specialty Comments:  No specialty comments available.  PMFS History: Patient Active Problem List   Diagnosis Date Noted  . Pulmonary hypertension, unspecified ([place]) 05/25/2019  .  Educated about COVID-19 virus infection 05/24/2019  . Obstructive hypertrophic cardiomyopathy ([place]) 05/24/2019  . OAB (overactive bladder) 09/29/2017  . OSA (obstructive sleep apnea) 09/29/2017  . Urge urinary incontinence 09/29/2017  . Fibromyalgia 11/11/2016  . Atrial septal hypertrophy 10/21/2015  . Paresthesia of both feet 07/31/2015  . Bradycardia 08/14/2014  . Obesity 10/09/2013  . Chest pain 10/08/2013  . Factor V Leiden carrier ([place]) 08/26/2011  . Protein S deficiency ([place]) 08/26/2011  . MORBID OBESITY 06/06/2010  . DEPRESSION 06/06/2010  . RESTLESS LEG SYNDROME 06/06/2010  . Essential hypertension 06/06/2010  . ALLERGIC RHINITIS 06/06/2010  . GERD 06/06/2010  . FATTY LIVER DISEASE 06/06/2010  . OSTEOARTHRITIS 06/06/2010  . MYOFASCIAL PAIN SYNDROME 06/06/2010  . EDEMA 06/06/2010  . DYSPNEA 06/06/2010  . CHEST PAIN UNSPECIFIED 06/06/2010   Past Medical History:  Diagnosis Date  . Allergic rhinitis   . Arthritis   . Depression   . Factor V Leiden carrier ([place]) 08/26/2011  . Fatty liver   . Fibroids    dysfuction uterine bleeding  . GERD (gastroesophageal reflux disease)   . Hypertension   . Osteoarthritis   . Peripheral neuropathy   . Psoriatic arthritis ([place])   . Restless leg syndrome     Family History  Problem Relation Age of Onset  . Coronary artery disease Father 19       deceased- TIAs  . Stroke Father   . Hypertension Mother 52       alive  . Deep vein thrombosis Mother   . Neuropathy Mother   . Osteoporosis Mother   . Liver disease Sister   . Other Sister        ITP  . Cancer Sister        breast  . Heart failure Maternal Grandmother     Past Surgical History:  Procedure Laterality Date  . CHOLECYSTECTOMY    . KNEE SURGERY     right TKR  . LUMBAR SPINE SURGERY    . TUBAL LIGATION     bilateral  . URETHRAL SLING    . VAGINAL HYSTERECTOMY     Social History   Occupational History  . 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 Smoking status: Never Smoker  . Smokeless tobacco: Never Used  Vaping Use  . Vaping Use: Never used  Substance and Sexual Activity  . Alcohol use: No    Alcohol/week: 0.0 standard drinks  . Drug use: No  . Sexual activity: Not on file

## 2020-01-29 DIAGNOSIS — G629 Polyneuropathy, unspecified: Secondary | ICD-10-CM | POA: Diagnosis not present

## 2020-01-29 DIAGNOSIS — Z79899 Other long term (current) drug therapy: Secondary | ICD-10-CM | POA: Diagnosis not present

## 2020-03-16 DIAGNOSIS — L03031 Cellulitis of right toe: Secondary | ICD-10-CM | POA: Diagnosis not present

## 2020-03-16 DIAGNOSIS — Z6838 Body mass index (BMI) 38.0-38.9, adult: Secondary | ICD-10-CM | POA: Diagnosis not present

## 2020-03-19 DIAGNOSIS — L03115 Cellulitis of right lower limb: Secondary | ICD-10-CM | POA: Diagnosis not present

## 2020-03-20 DIAGNOSIS — E538 Deficiency of other specified B group vitamins: Secondary | ICD-10-CM | POA: Diagnosis not present

## 2020-04-05 DIAGNOSIS — H2513 Age-related nuclear cataract, bilateral: Secondary | ICD-10-CM | POA: Diagnosis not present

## 2020-04-17 DIAGNOSIS — U071 COVID-19: Secondary | ICD-10-CM | POA: Diagnosis not present

## 2020-04-17 DIAGNOSIS — R5382 Chronic fatigue, unspecified: Secondary | ICD-10-CM | POA: Diagnosis not present

## 2020-04-17 DIAGNOSIS — R21 Rash and other nonspecific skin eruption: Secondary | ICD-10-CM | POA: Diagnosis not present

## 2020-04-17 DIAGNOSIS — G629 Polyneuropathy, unspecified: Secondary | ICD-10-CM | POA: Diagnosis not present

## 2020-04-17 DIAGNOSIS — M25552 Pain in left hip: Secondary | ICD-10-CM | POA: Diagnosis not present

## 2020-04-23 DIAGNOSIS — L299 Pruritus, unspecified: Secondary | ICD-10-CM | POA: Diagnosis not present

## 2020-04-23 DIAGNOSIS — L501 Idiopathic urticaria: Secondary | ICD-10-CM | POA: Diagnosis not present

## 2020-05-09 ENCOUNTER — Encounter: Payer: Self-pay | Admitting: Orthopaedic Surgery

## 2020-05-09 ENCOUNTER — Ambulatory Visit: Payer: Medicare PPO | Admitting: Orthopaedic Surgery

## 2020-05-09 ENCOUNTER — Telehealth: Payer: Self-pay | Admitting: Orthopaedic Surgery

## 2020-05-09 ENCOUNTER — Ambulatory Visit (INDEPENDENT_AMBULATORY_CARE_PROVIDER_SITE_OTHER): Payer: Medicare PPO

## 2020-05-09 DIAGNOSIS — I10 Essential (primary) hypertension: Secondary | ICD-10-CM | POA: Diagnosis not present

## 2020-05-09 DIAGNOSIS — L4050 Arthropathic psoriasis, unspecified: Secondary | ICD-10-CM | POA: Diagnosis not present

## 2020-05-09 DIAGNOSIS — M76892 Other specified enthesopathies of left lower limb, excluding foot: Secondary | ICD-10-CM | POA: Diagnosis not present

## 2020-05-09 DIAGNOSIS — Z1211 Encounter for screening for malignant neoplasm of colon: Secondary | ICD-10-CM | POA: Diagnosis not present

## 2020-05-09 DIAGNOSIS — E538 Deficiency of other specified B group vitamins: Secondary | ICD-10-CM | POA: Diagnosis not present

## 2020-05-09 DIAGNOSIS — Z0000 Encounter for general adult medical examination without abnormal findings: Secondary | ICD-10-CM | POA: Diagnosis not present

## 2020-05-09 DIAGNOSIS — F324 Major depressive disorder, single episode, in partial remission: Secondary | ICD-10-CM | POA: Diagnosis not present

## 2020-05-09 DIAGNOSIS — G2581 Restless legs syndrome: Secondary | ICD-10-CM | POA: Diagnosis not present

## 2020-05-09 DIAGNOSIS — G629 Polyneuropathy, unspecified: Secondary | ICD-10-CM | POA: Diagnosis not present

## 2020-05-09 MED ORDER — MELOXICAM 7.5 MG PO TABS: 7.5000 mg | ORAL_TABLET | Freq: Two times a day (BID) | ORAL | 2 refills | Status: DC | PRN

## 2020-05-09 NOTE — Telephone Encounter (Signed)
Patient called advised she is going to the beach Wednesday of next week and wanted to know should she be walking a lot on the beach? The number to contact patient is [phone]

## 2020-05-09 NOTE — Telephone Encounter (Signed)
She can walk as much as she thinks her symptoms will allow.  Try to avoid soft sand

## 2020-05-09 NOTE — Telephone Encounter (Signed)
Patient aware.

## 2020-05-09 NOTE — Progress Notes (Signed)
Office Visit Note   Patient: [name] [name]           Date of Birth: January 29, 1956           MRN: [id number] Visit Date: 05/09/2020              Requested by: Harlan Stains, MD [place],  [place] [address] PCP: Harlan Stains, MD   Assessment & Plan: Visit Diagnoses:  1. Hamstring tendinitis of left thigh     Plan: Impression is proximal hamstring tendinosis.  Recommend course of outpatient PT with modalities as well as meloxicam for a couple weeks.  She rest and stretch as well.  Follow-up as needed.  Follow-Up Instructions: Return if symptoms worsen or fail to improve.   Orders:  Orders Placed This Encounter  Procedures  . XR Pelvis 1-2 Views  . Ambulatory referral to Physical Therapy   Meds ordered this encounter  Medications  . meloxicam (MOBIC) 7.5 MG tablet    Sig: Take 1 tablet (7.5 mg total) by mouth 2 (two) times daily as needed for pain.    Dispense:  30 tablet    Refill:  2      Procedures: No procedures performed   Clinical Data: No additional findings.   Subjective: Chief Complaint  Patient presents with  . Left Leg - Pain    [name] [name] is a very pleasant 65 year old female here for evaluation of 1 month of left leg and buttock pain.  Denies any numbness and tingling.  She feels like there is a pulling sensation.  Denies any back pain or radicular symptoms.  She has pain when she leans forward to tie her shoes.  Feels better when she is lying down.  Takes Tylenol and Advil currently on a prednisone Dosepak which has helped some.   Review of Systems  Constitutional: Negative.   HENT: Negative.   Eyes: Negative.   Respiratory: Negative.   Cardiovascular: Negative.   Endocrine: Negative.   Musculoskeletal: Negative.   Neurological: Negative.   Hematological: Negative.   Psychiatric/Behavioral: Negative.   All other systems reviewed and are negative.    Objective: Vital Signs: There were no vitals taken for this  visit.  Physical Exam Vitals and nursing note reviewed.  Constitutional:      Appearance: She is well-developed and well-nourished.  HENT:     Head: Normocephalic and atraumatic.  Eyes:     Extraocular Movements: EOM normal.  Pulmonary:     Effort: Pulmonary effort is normal.  Abdominal:     Palpations: Abdomen is soft.  Musculoskeletal:     Cervical back: Neck supple.  Skin:    General: Skin is warm.     Capillary Refill: Capillary refill takes less than 2 seconds.  Neurological:     Mental Status: She is alert and oriented to person, place, and time.  Psychiatric:        Mood and Affect: Mood and affect normal.        Behavior: Behavior normal.        Thought Content: Thought content normal.        Judgment: Judgment normal.     Ortho Exam She has tenderness directly on the ischial tuberosity and the proximal hamstring insertion.  Strength is intact.  No sciatic tension signs.  Range of motion of the hip to the groin. Specialty Comments:  No specialty comments available.  Imaging: XR Pelvis 1-2 Views  Result Date: 05/09/2020 No acute or  structural abnormalities.  Mild hip arthritis.  Slight irregularity of the greater trochanter    PMFS History: Patient Active Problem List   Diagnosis Date Noted  . Pulmonary hypertension, unspecified ([place]) 05/25/2019  . Educated about COVID-19 virus infection 05/24/2019  . Obstructive hypertrophic cardiomyopathy ([place]) 05/24/2019  . OAB (overactive bladder) 09/29/2017  . OSA (obstructive sleep apnea) 09/29/2017  . Urge urinary incontinence 09/29/2017  . Fibromyalgia 11/11/2016  . Atrial septal hypertrophy 10/21/2015  . Paresthesia of both feet 07/31/2015  . Bradycardia 08/14/2014  . Obesity 10/09/2013  . Chest pain 10/08/2013  . Factor V Leiden carrier ([place]) 08/26/2011  . Protein S deficiency ([place]) 08/26/2011  . MORBID OBESITY 06/06/2010  . DEPRESSION 06/06/2010  . RESTLESS LEG SYNDROME 06/06/2010  . Essential  hypertension 06/06/2010  . ALLERGIC RHINITIS 06/06/2010  . GERD 06/06/2010  . FATTY LIVER DISEASE 06/06/2010  . OSTEOARTHRITIS 06/06/2010  . MYOFASCIAL PAIN SYNDROME 06/06/2010  . EDEMA 06/06/2010  . DYSPNEA 06/06/2010  . CHEST PAIN UNSPECIFIED 06/06/2010   Past Medical History:  Diagnosis Date  . Allergic rhinitis   . Arthritis   . Depression   . Factor V Leiden carrier ([place]) 08/26/2011  . Fatty liver   . Fibroids    dysfuction uterine bleeding  . GERD (gastroesophageal reflux disease)   . Hypertension   . Osteoarthritis   . Peripheral neuropathy   . Psoriatic arthritis ([place])   . Restless leg syndrome     Family History  Problem Relation Age of Onset  . Coronary artery disease Father 40       deceased- TIAs  . Stroke Father   . Hypertension Mother 18       alive  . Deep vein thrombosis Mother   . Neuropathy Mother   . Osteoporosis Mother   . Liver disease Sister   . Other Sister        ITP  . Cancer Sister        breast  . Heart failure Maternal Grandmother     Past Surgical History:  Procedure Laterality Date  . CHOLECYSTECTOMY    . KNEE SURGERY     right TKR  . LUMBAR SPINE SURGERY    . TUBAL LIGATION     bilateral  . URETHRAL SLING    . VAGINAL HYSTERECTOMY     Social History   Occupational History  . 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 Smoking status: Never Smoker  . Smokeless tobacco: Never Used  Vaping Use  . Vaping Use: Never used  Substance and Sexual Activity  . Alcohol use: No    Alcohol/week: 0.0 standard drinks  . Drug use: No  . Sexual activity: Not on file

## 2020-05-22 DIAGNOSIS — Z780 Asymptomatic menopausal state: Secondary | ICD-10-CM | POA: Diagnosis not present

## 2020-06-04 ENCOUNTER — Other Ambulatory Visit: Payer: Self-pay

## 2020-06-04 ENCOUNTER — Encounter: Payer: Self-pay | Admitting: Physical Therapy

## 2020-06-04 ENCOUNTER — Ambulatory Visit: Payer: Medicare PPO | Attending: Orthopaedic Surgery | Admitting: Physical Therapy

## 2020-06-04 DIAGNOSIS — M25652 Stiffness of left hip, not elsewhere classified: Secondary | ICD-10-CM | POA: Insufficient documentation

## 2020-06-04 DIAGNOSIS — M62838 Other muscle spasm: Secondary | ICD-10-CM | POA: Diagnosis not present

## 2020-06-04 DIAGNOSIS — R29898 Other symptoms and signs involving the musculoskeletal system: Secondary | ICD-10-CM | POA: Diagnosis not present

## 2020-06-04 DIAGNOSIS — M25552 Pain in left hip: Secondary | ICD-10-CM | POA: Diagnosis not present

## 2020-06-04 DIAGNOSIS — M25651 Stiffness of right hip, not elsewhere classified: Secondary | ICD-10-CM | POA: Insufficient documentation

## 2020-06-04 DIAGNOSIS — M25551 Pain in right hip: Secondary | ICD-10-CM | POA: Insufficient documentation

## 2020-06-04 NOTE — Therapy (Signed)
[facility] [address]  [place], [place], [address] Phone: [phone]   Fax:  [phone]  Physical Therapy Evaluation  Patient Details  Name: [name] [name] MRN: [id number] Date of Birth: 1955/05/01 Referring Provider (PT): Frankey Shown, MD   Encounter Date: 06/04/2020   PT End of Session - 06/04/20 0844    Visit Number 1    Number of Visits 7    Date for PT Re-Evaluation 07/16/20    Authorization Type [employer] Medicare    PT Start Time 0800    PT Stop Time 0839    PT Time Calculation (min) 39 min    Activity Tolerance Patient tolerated treatment well;Patient limited by pain    Behavior During Therapy [facility] for tasks assessed/performed           Past Medical History:  Diagnosis Date  . Allergic rhinitis   . Arthritis   . Depression   . Factor V Leiden carrier ([place]) 08/26/2011  . Fatty liver   . Fibroids    dysfuction uterine bleeding  . GERD (gastroesophageal reflux disease)   . Hypertension   . Osteoarthritis   . Peripheral neuropathy   . Psoriatic arthritis ([place])   . Restless leg syndrome     Past Surgical History:  Procedure Laterality Date  . CHOLECYSTECTOMY    . KNEE SURGERY     right TKR  . LUMBAR SPINE SURGERY    . TUBAL LIGATION     bilateral  . URETHRAL SLING    . VAGINAL HYSTERECTOMY      There were no vitals filed for this visit.    Subjective Assessment - 06/04/20 0804    Subjective Patient reports that she was seen by Dr. Erlinda Hong for L buttock pain- was told that this is tendinitis. It is improved but still there as she was previously unable to bend down to tie her shoes. Pain has been present for the past 2 months. Reports that around that time she climbed up on a stool and was pulling weeds, but unsure if this was the cause. Pain is located over the inferior L buttock with intermittent radiation down the posterior thigh. Denies N/T. Worse when bending forward, yard work, standing,  sitting. Better with laying down, walking.    Pertinent History psoriatic arthritis, peripheral neuropathy, HTN, GERD, depression, R TKA, lumbar spine surgery    Limitations House hold activities;Sitting;Standing    How long can you sit comfortably? 1 hour    How long can you stand comfortably? 15-20 minutes    How long can you walk comfortably? unlimited    Diagnostic tests none    Patient Stated Goals decrease pain    Currently in Pain? Yes    Pain Score 2     Pain Location Buttocks    Pain Orientation Left    Pain Descriptors / Indicators Aching;Throbbing    Pain Type Acute pain    Pain Radiating Towards posterior thigh              [facility] PT Assessment - 06/04/20 0809      Assessment   Medical Diagnosis Hamstring tendinitis of L thigh    Referring Provider (PT) Frankey Shown, MD    Onset Date/Surgical Date 04/06/20    Next MD Visit not scheduled    Prior Therapy yes      Precautions   Precautions None      Balance Screen   Has the patient fallen  in the past 6 months No    Has the patient had a decrease in activity level because of a fear of falling?  No    Is the patient reluctant to leave their home because of a fear of falling?  No      Home Environment   Living Environment Private residence    Living Arrangements Children    Available Help at Discharge Family    Type of [place] to enter    Entrance Stairs-Number of Steps 3    Entrance Stairs-Rails Right    [place] One level    [place] None      Prior Function   Level of Independence Independent    Vocation Part time employment;Retired    Biomedical scientist sitting, walking    Leisure none      Cognition   Overall Cognitive Status Within Functional Limits for tasks assessed      Sensation   Light Touch Not tested   reports limited sensation in B foot d/t neuropathy     Coordination   Gross Motor Movements are Fluid and Coordinated Yes      Posture/Postural  Control   Posture/Postural Control Postural limitations    Postural Limitations Rounded Shoulders;Increased thoracic kyphosis      ROM / Strength   AROM / PROM / Strength AROM;Strength      AROM   AROM Assessment Site Hip    Right/Left Hip Right;Left    Right Hip External Rotation  30   groin and lateral hip pain   Right Hip Internal Rotation  25    Left Hip External Rotation  29   groin and buttock pain   Left Hip Internal Rotation  29   pulling in buttock     Strength   Strength Assessment Site Hip;Knee;Ankle    Right/Left Hip Right;Left    Right Hip Flexion 4/5    Right Hip ABduction 4+/5    Right Hip ADduction 4+/5    Left Hip Flexion 4/5    Left Hip ABduction 4+/5    Left Hip ADduction 4+/5    Right/Left Knee Right;Left    Right Knee Flexion 4+/5    Right Knee Extension 5/5    Left Knee Flexion 4+/5   pain in L buttock   Left Knee Extension 5/5    Right/Left Ankle Right;Left    Right Ankle Dorsiflexion 4+/5    Right Ankle Plantar Flexion 4/5    Left Ankle Dorsiflexion 4+/5    Left Ankle Plantar Flexion 4/5      Flexibility   Soft Tissue Assessment /Muscle Length yes    Hamstrings B WFL; c/o more intense stretch on L    Quadriceps B moderately tight in mod thomas   c/o groin pain on L   Piriformis B mod-severely tight in fig 4 and KTOS   c/o groin pain on L     Palpation   Palpation comment TTP over B PSIS, B greater trochanters, B proximal glutes and piriformis with severe tightness; not TTP over B HS or HS insertion today      Ambulation/Gait   Assistive device None    Gait Pattern Step-through pattern;Decreased step length - left;Decreased step length - right;Decreased weight shift to left;Lateral trunk lean to right;Lateral trunk lean to left    Ambulation Surface Level;Indoor    Gait velocity slightly decreased  Objective measurements completed on examination: See above findings.               PT Education -  06/04/20 0843    Education Details prognosis, POC, HEP- advised to avoid pushing into pain    Person(s) Educated Patient    Methods Explanation;Demonstration;Tactile cues;Verbal cues;Handout    Comprehension Returned demonstration;Verbalized understanding            PT Short Term Goals - 06/04/20 0846      PT SHORT TERM GOAL #1   Title Patient to be independent with initial HEP.    Time 3    Period Weeks    Status New    Target Date 06/25/20             PT Long Term Goals - 06/04/20 0847      PT LONG TERM GOAL #1   Title Patient to be independent with advanced HEP.    Time 6    Period Weeks    Status New    Target Date 07/16/20      PT LONG TERM GOAL #2   Title Patient to demonstrate B hip AROM WFL and without pain limiting.    Time 6    Period Weeks    Status New    Target Date 07/16/20      PT LONG TERM GOAL #3   Title Patient to demonstrate B piriformis flexibility with mild-moderate tightness remaining.    Time 6    Period Weeks    Status New    Target Date 07/16/20      PT LONG TERM GOAL #4   Title Patient to report tolerance for sitting without limitation in order to tolerate work duties.    Time 6    Period Weeks    Status New    Target Date 07/16/20      PT LONG TERM GOAL #5   Title Patient to report ability to bend over to pick something off the ground without pain limiting.    Time 6    Period Weeks    Status New    Target Date 07/16/20                  Plan - 06/04/20 0844    Clinical Impression Statement Patient is a 65 y/o F presenting to OPPT with c/o insidious L buttock pain for the past 2 months. Pain is located over the inferior buttock with radiation down the posterior thigh. Denies N/T. Worse when bending forward, yard work, standing, sitting. Patient today presenting with rounded and slightly kyphotic posture, limited B hip flexor strength, decreased B hip AROM, tightness in B piriformis and hip flexors, pain with L HS  stretching, TTP over B PSIS, B greater trochanters, B proximal glutes and piriformis with severe tightness, and gait deviations. Patient was educated on gentle stretching and strengthening HEP- patient reported understanding. Would benefit from skilled PT services1/week for 6 weeks to address aforementioned impairments.    Personal Factors and Comorbidities Age;Comorbidity 3+;Past/Current Experience;Profession;Time since onset of injury/illness/exacerbation    Comorbidities psoriatic arthritis, peripheral neuropathy, HTN, GERD, depression, R TKA, lumbar spine surgery    Examination-Activity Limitations Bend;Stairs;Squat;Stand;Lift;Locomotion Level;Sit    Examination-Participation Restrictions Church;Cleaning;Community Activity;Shop;Driving;Yard Work;Laundry;Meal Prep;Occupation    Stability/Clinical Decision Making Stable/Uncomplicated    Clinical Decision Making Moderate    Rehab Potential Good    PT Frequency 1x / week    PT Duration 6 weeks    PT Treatment/Interventions ADLs/Self Care  Home Management;Cryotherapy;Electrical Stimulation;Iontophoresis 4mg /ml Dexamethasone;Moist Heat;Balance training;Therapeutic exercise;Therapeutic activities;Functional mobility training;Stair training;Gait training;Ultrasound;Neuromuscular re-education;Patient/family education;Manual techniques;Taping;Energy conservation;Dry needling;Passive range of motion    PT Next Visit Plan hip FOTO, reassess HEP; progress hip stretching and mobility    Consulted and Agree with Plan of Care Patient           Patient will benefit from skilled therapeutic intervention in order to improve the following deficits and impairments:  Hypomobility,Decreased activity tolerance,Decreased strength,Pain,Increased fascial restricitons,Difficulty walking,Increased muscle spasms,Improper body mechanics,Decreased range of motion,Impaired flexibility,Postural dysfunction  Visit Diagnosis: Pain in left hip  Pain in right hip  Stiffness  of left hip, not elsewhere classified  Stiffness of right hip, not elsewhere classified  Other muscle spasm  Other symptoms and signs involving the musculoskeletal system     Problem List Patient Active Problem List   Diagnosis Date Noted  . Pulmonary hypertension, unspecified ([place]) 05/25/2019  . Educated about COVID-19 virus infection 05/24/2019  . Obstructive hypertrophic cardiomyopathy ([place]) 05/24/2019  . OAB (overactive bladder) 09/29/2017  . OSA (obstructive sleep apnea) 09/29/2017  . Urge urinary incontinence 09/29/2017  . Fibromyalgia 11/11/2016  . Atrial septal hypertrophy 10/21/2015  . Paresthesia of both feet 07/31/2015  . Bradycardia 08/14/2014  . Obesity 10/09/2013  . Chest pain 10/08/2013  . Factor V Leiden carrier ([place]) 08/26/2011  . Protein S deficiency ([place]) 08/26/2011  . MORBID OBESITY 06/06/2010  . DEPRESSION 06/06/2010  . RESTLESS LEG SYNDROME 06/06/2010  . Essential hypertension 06/06/2010  . ALLERGIC RHINITIS 06/06/2010  . GERD 06/06/2010  . FATTY LIVER DISEASE 06/06/2010  . OSTEOARTHRITIS 06/06/2010  . MYOFASCIAL PAIN SYNDROME 06/06/2010  . EDEMA 06/06/2010  . DYSPNEA 06/06/2010  . CHEST PAIN UNSPECIFIED 06/06/2010     Janene Harvey, PT, DPT 06/04/20 9:32 AM   [facility] [address]  [place], [place], [address] Phone: [phone]   Fax:  [phone]  Name: TIANNI ESCAMILLA MRN: [id number] Date of Birth: 1955-07-09

## 2020-06-07 DIAGNOSIS — L503 Dermatographic urticaria: Secondary | ICD-10-CM | POA: Diagnosis not present

## 2020-06-07 DIAGNOSIS — L300 Nummular dermatitis: Secondary | ICD-10-CM | POA: Diagnosis not present

## 2020-06-14 ENCOUNTER — Ambulatory Visit: Payer: Medicare PPO

## 2020-06-14 ENCOUNTER — Other Ambulatory Visit: Payer: Self-pay

## 2020-06-14 DIAGNOSIS — M25551 Pain in right hip: Secondary | ICD-10-CM | POA: Diagnosis not present

## 2020-06-14 DIAGNOSIS — M25652 Stiffness of left hip, not elsewhere classified: Secondary | ICD-10-CM

## 2020-06-14 DIAGNOSIS — M62838 Other muscle spasm: Secondary | ICD-10-CM | POA: Diagnosis not present

## 2020-06-14 DIAGNOSIS — M25651 Stiffness of right hip, not elsewhere classified: Secondary | ICD-10-CM

## 2020-06-14 DIAGNOSIS — M25552 Pain in left hip: Secondary | ICD-10-CM | POA: Diagnosis not present

## 2020-06-14 DIAGNOSIS — R29898 Other symptoms and signs involving the musculoskeletal system: Secondary | ICD-10-CM | POA: Diagnosis not present

## 2020-06-14 NOTE — Therapy (Signed)
[facility] [address]  [place], [place], [address] Phone: [phone]   Fax:  [phone]  Physical Therapy Treatment  Patient Details  Name: [name] [name] MRN: [id number] Date of Birth: 02-02-1956 Referring Provider (PT): Frankey Shown, MD   Encounter Date: 06/14/2020   PT End of Session - 06/14/20 0840    Visit Number 2    Number of Visits 7    Date for PT Re-Evaluation 07/16/20    Authorization Type [employer] Medicare    PT Start Time 0802    PT Stop Time 0841    PT Time Calculation (min) 39 min    Activity Tolerance Patient tolerated treatment well    Behavior During Therapy [facility] for tasks assessed/performed           Past Medical History:  Diagnosis Date  . Allergic rhinitis   . Arthritis   . Depression   . Factor V Leiden carrier ([place]) 08/26/2011  . Fatty liver   . Fibroids    dysfuction uterine bleeding  . GERD (gastroesophageal reflux disease)   . Hypertension   . Osteoarthritis   . Peripheral neuropathy   . Psoriatic arthritis ([place])   . Restless leg syndrome     Past Surgical History:  Procedure Laterality Date  . CHOLECYSTECTOMY    . KNEE SURGERY     right TKR  . LUMBAR SPINE SURGERY    . TUBAL LIGATION     bilateral  . URETHRAL SLING    . VAGINAL HYSTERECTOMY      There were no vitals filed for this visit.   Subjective Assessment - 06/14/20 0805    Subjective Pt reports that she has been getting cramps in her inner thigh and post thigh while doing her stretches.    Pertinent History psoriatic arthritis, peripheral neuropathy, HTN, GERD, depression, R TKA, lumbar spine surgery    Diagnostic tests none    Patient Stated Goals decrease pain    Currently in Pain? No/denies              [facility] PT Assessment - 06/14/20 0001      Observation/Other Assessments   Focus on Therapeutic Outcomes (FOTO)  Hip;64,  Predicted: 77                         [facility] Adult PT  Treatment/Exercise - 06/14/20 0001      Exercises   Exercises Knee/Hip      Knee/Hip Exercises: Stretches   Active Hamstring Stretch Both;1 rep;30 seconds    Active Hamstring Stretch Limitations seated    Piriformis Stretch Right;Left;1 rep;30 seconds    Piriformis Stretch Limitations KTOS supine      Knee/Hip Exercises: Aerobic   Nustep L2x6min      Knee/Hip Exercises: Standing   Hip Flexion Stengthening;Both;1 set;10 reps;Knee bent;Limitations    Hip Flexion Limitations Rtband around foot      Knee/Hip Exercises: Seated   Hamstring Curl Strengthening;Both;2 sets;10 reps;Limitations    Hamstring Limitations Rtband      Knee/Hip Exercises: Supine   Straight Leg Raises Strengthening;Both;1 set;10 reps    Straight Leg Raises Limitations cues to keep TKE and for eccentric lowering to mat    Other Supine Knee/Hip Exercises pelvic tilt 10 reps                    PT Short Term Goals - 06/04/20 7416  PT SHORT TERM GOAL #1   Title Patient to be independent with initial HEP.    Time 3    Period Weeks    Status New    Target Date 06/25/20             PT Long Term Goals - 06/04/20 0847      PT LONG TERM GOAL #1   Title Patient to be independent with advanced HEP.    Time 6    Period Weeks    Status New    Target Date 07/16/20      PT LONG TERM GOAL #2   Title Patient to demonstrate B hip AROM WFL and without pain limiting.    Time 6    Period Weeks    Status New    Target Date 07/16/20      PT LONG TERM GOAL #3   Title Patient to demonstrate B piriformis flexibility with mild-moderate tightness remaining.    Time 6    Period Weeks    Status New    Target Date 07/16/20      PT LONG TERM GOAL #4   Title Patient to report tolerance for sitting without limitation in order to tolerate work duties.    Time 6    Period Weeks    Status New    Target Date 07/16/20      PT LONG TERM GOAL #5   Title Patient to report ability to bend over to pick  something off the ground without pain limiting.    Time 6    Period Weeks    Status New    Target Date 07/16/20                 Plan - 06/14/20 0841    Clinical Impression Statement Pt responded well to session, reviewed inital HEP with pt to ensure understanding and proper performance of exercises. She demonstrated and verbalized understanding of exercises, with modifications given to pt comfort. She had reports of inner thigh and hs cramps with doing the piriformis and hs stretches, she noted that they subsided with instruction to avoid stretching too far into motions. She noted that she felt better at the end of session after doing the exercises.    Personal Factors and Comorbidities Age;Comorbidity 3+;Past/Current Experience;Profession;Time since onset of injury/illness/exacerbation    Comorbidities psoriatic arthritis, peripheral neuropathy, HTN, GERD, depression, R TKA, lumbar spine surgery    PT Frequency 1x / week    PT Duration 6 weeks    PT Treatment/Interventions ADLs/Self Care Home Management;Cryotherapy;Electrical Stimulation;Iontophoresis 4mg /ml Dexamethasone;Moist Heat;Balance training;Therapeutic exercise;Therapeutic activities;Functional mobility training;Stair training;Gait training;Ultrasound;Neuromuscular re-education;Patient/family education;Manual techniques;Taping;Energy conservation;Dry needling;Passive range of motion    PT Next Visit Plan progress hip stretching and mobility    Consulted and Agree with Plan of Care Patient           Patient will benefit from skilled therapeutic intervention in order to improve the following deficits and impairments:  Hypomobility,Decreased activity tolerance,Decreased strength,Pain,Increased fascial restricitons,Difficulty walking,Increased muscle spasms,Improper body mechanics,Decreased range of motion,Impaired flexibility,Postural dysfunction  Visit Diagnosis: Pain in left hip  Pain in right hip  Stiffness of left hip,  not elsewhere classified  Stiffness of right hip, not elsewhere classified     Problem List Patient Active Problem List   Diagnosis Date Noted  . Pulmonary hypertension, unspecified ([place]) 05/25/2019  . Educated about COVID-19 virus infection 05/24/2019  . Obstructive hypertrophic cardiomyopathy ([place]) 05/24/2019  . OAB (overactive bladder) 09/29/2017  . OSA (  obstructive sleep apnea) 09/29/2017  . Urge urinary incontinence 09/29/2017  . Fibromyalgia 11/11/2016  . Atrial septal hypertrophy 10/21/2015  . Paresthesia of both feet 07/31/2015  . Bradycardia 08/14/2014  . Obesity 10/09/2013  . Chest pain 10/08/2013  . Factor V Leiden carrier ([place]) 08/26/2011  . Protein S deficiency ([place]) 08/26/2011  . MORBID OBESITY 06/06/2010  . DEPRESSION 06/06/2010  . RESTLESS LEG SYNDROME 06/06/2010  . Essential hypertension 06/06/2010  . ALLERGIC RHINITIS 06/06/2010  . GERD 06/06/2010  . FATTY LIVER DISEASE 06/06/2010  . OSTEOARTHRITIS 06/06/2010  . MYOFASCIAL PAIN SYNDROME 06/06/2010  . EDEMA 06/06/2010  . DYSPNEA 06/06/2010  . CHEST PAIN UNSPECIFIED 06/06/2010    Artist Pais, PTA 06/14/2020, 8:51 AM  [facility] [address]  [place], [place], [address] Phone: [phone]   Fax:  [phone]  Name: [name] [name] MRN: [id number] Date of Birth: 31-Jan-1956

## 2020-06-19 ENCOUNTER — Other Ambulatory Visit: Payer: Self-pay

## 2020-06-19 ENCOUNTER — Ambulatory Visit: Payer: Medicare PPO | Admitting: Physical Therapy

## 2020-06-19 ENCOUNTER — Encounter: Payer: Self-pay | Admitting: Physical Therapy

## 2020-06-19 DIAGNOSIS — R29898 Other symptoms and signs involving the musculoskeletal system: Secondary | ICD-10-CM | POA: Diagnosis not present

## 2020-06-19 DIAGNOSIS — M25552 Pain in left hip: Secondary | ICD-10-CM

## 2020-06-19 DIAGNOSIS — M62838 Other muscle spasm: Secondary | ICD-10-CM | POA: Diagnosis not present

## 2020-06-19 DIAGNOSIS — M25651 Stiffness of right hip, not elsewhere classified: Secondary | ICD-10-CM | POA: Diagnosis not present

## 2020-06-19 DIAGNOSIS — M25652 Stiffness of left hip, not elsewhere classified: Secondary | ICD-10-CM | POA: Diagnosis not present

## 2020-06-19 DIAGNOSIS — M25551 Pain in right hip: Secondary | ICD-10-CM

## 2020-06-19 NOTE — Therapy (Signed)
[facility] [address]  [place], [place], [address] Phone: [phone]   Fax:  [phone]  Physical Therapy Treatment  Patient Details  Name: [name] [name] MRN: [id number] Date of Birth: 1955-10-10 Referring Provider (PT): Frankey Shown, MD   Encounter Date: 06/19/2020   PT End of Session - 06/19/20 1053    Visit Number 3    Number of Visits 7    Date for PT Re-Evaluation 07/16/20    Authorization Type [employer]    Authorization Time Period 5 visits approved 06/04/20-07/16/20    Authorization - Visit Number 2    Authorization - Number of Visits 5    PT Start Time 1016    PT Stop Time 1054    PT Time Calculation (min) 38 min    Activity Tolerance Patient tolerated treatment well    Behavior During Therapy [facility] for tasks assessed/performed           Past Medical History:  Diagnosis Date  . Allergic rhinitis   . Arthritis   . Depression   . Factor V Leiden carrier ([place]) 08/26/2011  . Fatty liver   . Fibroids    dysfuction uterine bleeding  . GERD (gastroesophageal reflux disease)   . Hypertension   . Osteoarthritis   . Peripheral neuropathy   . Psoriatic arthritis ([place])   . Restless leg syndrome     Past Surgical History:  Procedure Laterality Date  . CHOLECYSTECTOMY    . KNEE SURGERY     right TKR  . LUMBAR SPINE SURGERY    . TUBAL LIGATION     bilateral  . URETHRAL SLING    . VAGINAL HYSTERECTOMY      There were no vitals filed for this visit.   Subjective Assessment - 06/19/20 1018    Subjective Doing pretty good. Still getting some cramps in her legs and her LB is still hurting some. Cramps occur at night.    Pertinent History psoriatic arthritis, peripheral neuropathy, HTN, GERD, depression, R TKA, lumbar spine surgery    Diagnostic tests none    Patient Stated Goals decrease pain    Currently in Pain? No/denies                             [facility] Adult PT  Treatment/Exercise - 06/19/20 0001      Knee/Hip Exercises: Stretches   Active Hamstring Stretch Both;30 seconds;2 reps    Active Hamstring Stretch Limitations seated    Piriformis Stretch Left;2 reps;30 seconds    Piriformis Stretch Limitations modified ER stretch w/ folded pillow under knee    Other Knee/Hip Stretches L supine fig 4 stretch 2x30"   preference for this version     Knee/Hip Exercises: Aerobic   Nustep L4x6 min (UEs/LEs)      Knee/Hip Exercises: Standing   Hip Flexion Stengthening;Both;10 reps;Knee bent;Limitations;2 sets    Hip Flexion Limitations Rtband around foot   good posture     Knee/Hip Exercises: Seated   Hamstring Curl Strengthening;Both;10 reps;Limitations;1 set   c/o challenge, but good form   Hamstring Limitations green TB      Knee/Hip Exercises: Supine   Bridges Strengthening;Both;1 set;10 reps   cueing to avoid HS cramp     Knee/Hip Exercises: Prone   Hamstring Curl 1 set;10 reps    Hamstring Curl Limitations 2#; slow eccentric lower   c/o more challenge on L  Hip Extension Strengthening;Right;Left;1 set;10 reps    Hip Extension Limitations knee extended   more challenge on L                 PT Education - 06/19/20 1052    Education Details update to HEP; administered red TB for [employer]) Educated Patient    Methods Explanation;Demonstration;Tactile cues;Verbal cues;Handout    Comprehension Verbalized understanding;Returned demonstration            PT Short Term Goals - 06/19/20 1054      PT SHORT TERM GOAL #1   Title Patient to be independent with initial HEP.    Time 3    Period Weeks    Status On-going    Target Date 06/25/20             PT Long Term Goals - 06/19/20 1054      PT LONG TERM GOAL #1   Title Patient to be independent with advanced HEP.    Time 6    Period Weeks    Status On-going      PT LONG TERM GOAL #2   Title Patient to demonstrate B hip AROM WFL and without pain limiting.    Time  6    Period Weeks    Status On-going      PT LONG TERM GOAL #3   Title Patient to demonstrate B piriformis flexibility with mild-moderate tightness remaining.    Time 6    Period Weeks    Status On-going      PT LONG TERM GOAL #4   Title Patient to report tolerance for sitting without limitation in order to tolerate work duties.    Time 6    Period Weeks    Status On-going      PT LONG TERM GOAL #5   Title Patient to report ability to bend over to pick something off the ground without pain limiting.    Time 6    Period Weeks    Status On-going                 Plan - 06/19/20 1054    Clinical Impression Statement Patient arrived to session with report of nocturnal cramps in the L LE as well as some continued LBP. Reviewed LE stretches with patient tolerating these well- good flexibility in HS, however more so limited in piriformis/ER. Patient reported preference for supine vs. sitting piriformis stretch, thus updated this into HEP. Initiated glute strengthening with patient requiring cueing to avoid HS cramping. Intermittent stretching required between reps to avoid cramping sensation. Initiated prone HS and glute strengthening with focus on slow eccentric lowering; patient noted increased challenge on L vs. R LE. Increased banded resistance with HEP ther-ex, which patient tolerated well. Updated HEP figure 4 stretch and administered red TB for resisted march. Patient reported understanding and without complaints at end of session.    Personal Factors and Comorbidities Age;Comorbidity 3+;Past/Current Experience;Profession;Time since onset of injury/illness/exacerbation    Comorbidities psoriatic arthritis, peripheral neuropathy, HTN, GERD, depression, R TKA, lumbar spine surgery    PT Frequency 1x / week    PT Duration 6 weeks    PT Treatment/Interventions ADLs/Self Care Home Management;Cryotherapy;Electrical Stimulation;Iontophoresis 4mg /ml Dexamethasone;Moist Heat;Balance  training;Therapeutic exercise;Therapeutic activities;Functional mobility training;Stair training;Gait training;Ultrasound;Neuromuscular re-education;Patient/family education;Manual techniques;Taping;Energy conservation;Dry needling;Passive range of motion    PT Next Visit Plan progress hip stretching and mobility    Consulted and Agree with Plan of Care Patient  Patient will benefit from skilled therapeutic intervention in order to improve the following deficits and impairments:  Hypomobility,Decreased activity tolerance,Decreased strength,Pain,Increased fascial restricitons,Difficulty walking,Increased muscle spasms,Improper body mechanics,Decreased range of motion,Impaired flexibility,Postural dysfunction  Visit Diagnosis: Pain in left hip  Pain in right hip  Stiffness of left hip, not elsewhere classified  Stiffness of right hip, not elsewhere classified  Other muscle spasm  Other symptoms and signs involving the musculoskeletal system     Problem List Patient Active Problem List   Diagnosis Date Noted  . Pulmonary hypertension, unspecified ([place]) 05/25/2019  . Educated about COVID-19 virus infection 05/24/2019  . Obstructive hypertrophic cardiomyopathy ([place]) 05/24/2019  . OAB (overactive bladder) 09/29/2017  . OSA (obstructive sleep apnea) 09/29/2017  . Urge urinary incontinence 09/29/2017  . Fibromyalgia 11/11/2016  . Atrial septal hypertrophy 10/21/2015  . Paresthesia of both feet 07/31/2015  . Bradycardia 08/14/2014  . Obesity 10/09/2013  . Chest pain 10/08/2013  . Factor V Leiden carrier ([place]) 08/26/2011  . Protein S deficiency ([place]) 08/26/2011  . MORBID OBESITY 06/06/2010  . DEPRESSION 06/06/2010  . RESTLESS LEG SYNDROME 06/06/2010  . Essential hypertension 06/06/2010  . ALLERGIC RHINITIS 06/06/2010  . GERD 06/06/2010  . FATTY LIVER DISEASE 06/06/2010  . OSTEOARTHRITIS 06/06/2010  . MYOFASCIAL PAIN SYNDROME 06/06/2010  . EDEMA 06/06/2010  .  DYSPNEA 06/06/2010  . CHEST PAIN UNSPECIFIED 06/06/2010     Janene Harvey, PT, DPT 06/19/20 10:55 AM   [facility] [address]  [place], [place], [address] Phone: [phone]   Fax:  [phone]  Name: [name] [name] MRN: [id number] Date of Birth: 03-Jun-1955

## 2020-06-26 ENCOUNTER — Other Ambulatory Visit: Payer: Self-pay

## 2020-06-26 ENCOUNTER — Ambulatory Visit: Payer: Medicare PPO

## 2020-06-26 DIAGNOSIS — M25651 Stiffness of right hip, not elsewhere classified: Secondary | ICD-10-CM | POA: Diagnosis not present

## 2020-06-26 DIAGNOSIS — M25552 Pain in left hip: Secondary | ICD-10-CM

## 2020-06-26 DIAGNOSIS — M62838 Other muscle spasm: Secondary | ICD-10-CM

## 2020-06-26 DIAGNOSIS — M25551 Pain in right hip: Secondary | ICD-10-CM | POA: Diagnosis not present

## 2020-06-26 DIAGNOSIS — M25652 Stiffness of left hip, not elsewhere classified: Secondary | ICD-10-CM | POA: Diagnosis not present

## 2020-06-26 DIAGNOSIS — R29898 Other symptoms and signs involving the musculoskeletal system: Secondary | ICD-10-CM | POA: Diagnosis not present

## 2020-06-26 NOTE — Therapy (Signed)
[facility] [address]  [place], [place], [address] Phone: [phone]   Fax:  [phone]  Physical Therapy Treatment  Patient Details  Name: [name] [name] MRN: [id number] Date of Birth: 1955-08-11 Referring Provider (PT): Frankey Shown, MD   Encounter Date: 06/26/2020   PT End of Session - 06/26/20 1046    Visit Number 4    Number of Visits 7    Date for PT Re-Evaluation 07/16/20    Authorization Type [employer]    Authorization Time Period 5 visits approved 06/04/20-07/16/20    Authorization - Visit Number 2    Authorization - Number of Visits 5    PT Start Time 0954    PT Stop Time 1038    PT Time Calculation (min) 44 min    Activity Tolerance Patient tolerated treatment well    Behavior During Therapy [facility] for tasks assessed/performed           Past Medical History:  Diagnosis Date  . Allergic rhinitis   . Arthritis   . Depression   . Factor V Leiden carrier ([place]) 08/26/2011  . Fatty liver   . Fibroids    dysfuction uterine bleeding  . GERD (gastroesophageal reflux disease)   . Hypertension   . Osteoarthritis   . Peripheral neuropathy   . Psoriatic arthritis ([place])   . Restless leg syndrome     Past Surgical History:  Procedure Laterality Date  . CHOLECYSTECTOMY    . KNEE SURGERY     right TKR  . LUMBAR SPINE SURGERY    . TUBAL LIGATION     bilateral  . URETHRAL SLING    . VAGINAL HYSTERECTOMY      There were no vitals filed for this visit.   Subjective Assessment - 06/26/20 0956    Subjective Pt doing ok, notes some flare ups with her arthritis has not been doing exercises as much, less cramping d/t her increasing her water intake.    Pertinent History psoriatic arthritis, peripheral neuropathy, HTN, GERD, depression, R TKA, lumbar spine surgery    Diagnostic tests none    Patient Stated Goals decrease pain    Currently in Pain? Yes    Pain Score 5     Pain Location Back     Pain Orientation Mid    Pain Descriptors / Indicators Constant;Burning    Pain Type Acute pain    Pain Radiating Towards posterior thigh    Multiple Pain Sites --                             [facility] Adult PT Treatment/Exercise - 06/26/20 0001      Exercises   Exercises Knee/Hip      Knee/Hip Exercises: Stretches   Active Hamstring Stretch Right;Left;2 reps;30 seconds    Active Hamstring Stretch Limitations seated    Hip Flexor Stretch Right;Left;2 reps;30 seconds;Limitations    Hip Flexor Stretch Limitations mod thomas with strap    Piriformis Stretch Both;2 reps;30 seconds    Piriformis Stretch Limitations KTOS supine with towel for assist      Knee/Hip Exercises: Aerobic   Nustep L4x70min      Knee/Hip Exercises: Machines for Strengthening   Cybex Knee Extension 10 reps 5#    Cybex Knee Flexion 15# 10 reps      Knee/Hip Exercises: Standing   Hip Flexion Stengthening;Both;2 sets;10 reps;Knee straight;Knee bent;Limitations   second set  marches   Hip Flexion Limitations Gtband above ankles    Hip Abduction Stengthening;Both;1 set;10 reps;Knee straight    Abduction Limitations Gtband above ankles    Functional Squat 1 set;5 reps;Limitations    Functional Squat Limitations 2HHA at counter      Knee/Hip Exercises: Seated   Sit to Sand without UE support;1 set;10 reps   decreased WS to the L side; cues for equal WB     Knee/Hip Exercises: Supine   Bridges Strengthening;Both;1 set;10 reps;Limitations    Bridges Limitations R tband hip abd    Straight Leg Raises Strengthening;Both;1 set;10 reps    Straight Leg Raises Limitations good form    Other Supine Knee/Hip Exercises bent knee raise 10 reps B with Rtband   cues for abdominal bracing                 PT Education - 06/26/20 1029    Education Details HEP update; Access Code [email or web address]    Person(s) Educated Patient    Methods Explanation;Demonstration;Verbal cues;Handout    Comprehension  Verbalized understanding;Returned demonstration;Verbal cues required            PT Short Term Goals - 06/19/20 1054      PT SHORT TERM GOAL #1   Title Patient to be independent with initial HEP.    Time 3    Period Weeks    Status On-going    Target Date 06/25/20             PT Long Term Goals - 06/19/20 1054      PT LONG TERM GOAL #1   Title Patient to be independent with advanced HEP.    Time 6    Period Weeks    Status On-going      PT LONG TERM GOAL #2   Title Patient to demonstrate B hip AROM WFL and without pain limiting.    Time 6    Period Weeks    Status On-going      PT LONG TERM GOAL #3   Title Patient to demonstrate B piriformis flexibility with mild-moderate tightness remaining.    Time 6    Period Weeks    Status On-going      PT LONG TERM GOAL #4   Title Patient to report tolerance for sitting without limitation in order to tolerate work duties.    Time 6    Period Weeks    Status On-going      PT LONG TERM GOAL #5   Title Patient to report ability to bend over to pick something off the ground without pain limiting.    Time 6    Period Weeks    Status On-going                 Plan - 06/26/20 1047    Clinical Impression Statement Pt had no complaints during session. Cues were needed during session to isolate hip muscles during strengthening, keep abdominal bracing, and to equally shift weight during STS. Showed increase ability to perform mod thomas position and noted that it provided a good stretch. She has been having flare ups d/t her arthritis causing some low back and hip pain but was able to complete the exercises w/o any increase in pain. She still has a lot of tightness in her B piriformis and hip flexors.    Personal Factors and Comorbidities Age;Comorbidity 3+;Past/Current Experience;Profession;Time since onset of injury/illness/exacerbation    Comorbidities psoriatic arthritis, peripheral neuropathy, HTN, GERD, depression, R  TKA, lumbar spine surgery    PT Frequency 1x / week    PT Duration 6 weeks    PT Treatment/Interventions ADLs/Self Care Home Management;Cryotherapy;Electrical Stimulation;Iontophoresis 4mg /ml Dexamethasone;Moist Heat;Balance training;Therapeutic exercise;Therapeutic activities;Functional mobility training;Stair training;Gait training;Ultrasound;Neuromuscular re-education;Patient/family education;Manual techniques;Taping;Energy conservation;Dry needling;Passive range of motion    PT Next Visit Plan progress hip stretching and mobility    Consulted and Agree with Plan of Care Patient           Patient will benefit from skilled therapeutic intervention in order to improve the following deficits and impairments:  Hypomobility,Decreased activity tolerance,Decreased strength,Pain,Increased fascial restricitons,Difficulty walking,Increased muscle spasms,Improper body mechanics,Decreased range of motion,Impaired flexibility,Postural dysfunction  Visit Diagnosis: Pain in left hip  Pain in right hip  Stiffness of left hip, not elsewhere classified  Stiffness of right hip, not elsewhere classified  Other muscle spasm     Problem List Patient Active Problem List   Diagnosis Date Noted  . Pulmonary hypertension, unspecified ([place]) 05/25/2019  . Educated about COVID-19 virus infection 05/24/2019  . Obstructive hypertrophic cardiomyopathy ([place]) 05/24/2019  . OAB (overactive bladder) 09/29/2017  . OSA (obstructive sleep apnea) 09/29/2017  . Urge urinary incontinence 09/29/2017  . Fibromyalgia 11/11/2016  . Atrial septal hypertrophy 10/21/2015  . Paresthesia of both feet 07/31/2015  . Bradycardia 08/14/2014  . Obesity 10/09/2013  . Chest pain 10/08/2013  . Factor V Leiden carrier ([place]) 08/26/2011  . Protein S deficiency ([place]) 08/26/2011  . MORBID OBESITY 06/06/2010  . DEPRESSION 06/06/2010  . RESTLESS LEG SYNDROME 06/06/2010  . Essential hypertension 06/06/2010  . ALLERGIC RHINITIS  06/06/2010  . GERD 06/06/2010  . FATTY LIVER DISEASE 06/06/2010  . OSTEOARTHRITIS 06/06/2010  . MYOFASCIAL PAIN SYNDROME 06/06/2010  . EDEMA 06/06/2010  . DYSPNEA 06/06/2010  . CHEST PAIN UNSPECIFIED 06/06/2010    Artist Pais, PTA 06/26/2020, 10:55 AM  [facility] [address]  [place], [place], [address] Phone: [phone]   Fax:  [phone]  Name: [name] [name] MRN: [id number] Date of Birth: 10/21/1955

## 2020-07-03 ENCOUNTER — Ambulatory Visit: Payer: Medicare PPO | Admitting: Physical Therapy

## 2020-07-04 ENCOUNTER — Telehealth: Payer: Self-pay | Admitting: Orthopaedic Surgery

## 2020-07-04 NOTE — Telephone Encounter (Signed)
Patient states her last visit gave her problems with right hip. Patient is asking for medical advice and also asking for pain medication. Please call patient at [phone].

## 2020-07-04 NOTE — Telephone Encounter (Signed)
Last visit with who? [place] or PT?  Not sure what crystal is referring to.  Probably needs to come in to see [place] first.  Thanks.

## 2020-07-04 NOTE — Telephone Encounter (Signed)
Please make appt. Thanks.

## 2020-07-09 DIAGNOSIS — M2550 Pain in unspecified joint: Secondary | ICD-10-CM | POA: Diagnosis not present

## 2020-07-09 DIAGNOSIS — Z79899 Other long term (current) drug therapy: Secondary | ICD-10-CM | POA: Diagnosis not present

## 2020-07-09 DIAGNOSIS — L4050 Arthropathic psoriasis, unspecified: Secondary | ICD-10-CM | POA: Diagnosis not present

## 2020-07-09 DIAGNOSIS — M150 Primary generalized (osteo)arthritis: Secondary | ICD-10-CM | POA: Diagnosis not present

## 2020-07-09 DIAGNOSIS — M7918 Myalgia, other site: Secondary | ICD-10-CM | POA: Diagnosis not present

## 2020-07-09 DIAGNOSIS — Z7189 Other specified counseling: Secondary | ICD-10-CM | POA: Diagnosis not present

## 2020-07-09 DIAGNOSIS — L409 Psoriasis, unspecified: Secondary | ICD-10-CM | POA: Diagnosis not present

## 2020-07-09 DIAGNOSIS — L97522 Non-pressure chronic ulcer of other part of left foot with fat layer exposed: Secondary | ICD-10-CM | POA: Diagnosis not present

## 2020-07-09 DIAGNOSIS — M65332 Trigger finger, left middle finger: Secondary | ICD-10-CM | POA: Diagnosis not present

## 2020-07-10 ENCOUNTER — Encounter: Payer: Self-pay | Admitting: Orthopaedic Surgery

## 2020-07-10 ENCOUNTER — Ambulatory Visit: Payer: Medicare PPO | Admitting: Orthopaedic Surgery

## 2020-07-10 ENCOUNTER — Ambulatory Visit (INDEPENDENT_AMBULATORY_CARE_PROVIDER_SITE_OTHER): Payer: Medicare PPO

## 2020-07-10 ENCOUNTER — Ambulatory Visit: Payer: Medicare PPO

## 2020-07-10 ENCOUNTER — Other Ambulatory Visit: Payer: Self-pay

## 2020-07-10 DIAGNOSIS — M545 Low back pain, unspecified: Secondary | ICD-10-CM

## 2020-07-10 MED ORDER — METHOCARBAMOL 500 MG PO TABS: 500.0000 mg | ORAL_TABLET | Freq: Two times a day (BID) | ORAL | 0 refills | Status: DC | PRN

## 2020-07-10 NOTE — Progress Notes (Signed)
Office Visit Note   Patient: [name] [name]           Date of Birth: 1955/11/10           MRN: [id number] Visit Date: 07/10/2020              Requested by: Harlan Stains, MD [place],  [place] [address] PCP: Harlan Stains, MD   Assessment & Plan: Visit Diagnoses:  1. Low back pain, unspecified back pain laterality, unspecified chronicity, unspecified whether sciatica present     Plan: Impression is chronic right lower back pain and right lower extremity radiculopathy as well as underlying right hip arthritis.  I believe the majority of the patient's symptoms are coming from her back at this time.  She is currently in physical therapy for her left hamstring tendinitis so we will go ahead and add lumbar stretches and modalities to her current regimen.  Rheumatologist called in a steroid taper yesterday for which she will start today.  I have added a muscle relaxer to this.  If her symptoms worsen or do not continue to improve over the next several weeks, she will let us know.  Otherwise follow-up with [place] as needed.  Follow-Up Instructions: Return if symptoms worsen or fail to improve.   Orders:  Orders Placed This Encounter  Procedures  . XR Lumbar Spine 2-3 Views   Meds ordered this encounter  Medications  . methocarbamol (ROBAXIN) 500 MG tablet    Sig: Take 1 tablet (500 mg total) by mouth 2 (two) times daily as needed.    Dispense:  20 tablet    Refill:  0      Procedures: No procedures performed   Clinical Data: No additional findings.   Subjective: Chief Complaint  Patient presents with  . Right Hip - Pain  . Lower Back - Pain    HPI is a very pleasant 65 year old female who comes in today with right lower back pain for the past week.  She noticed a flareup following a lot of work in her yard about 1 to 2 weeks ago.  The pain she has is to the right lower back and radiates down into the buttock down the back of the leg and into the  foot.  She has increased pain with bending over and lying flat in the bed.  She also notes pain to the groin if she is standing a long period of time.  She has been taking Advil, Tylenol and Mobic which does seem to help.  She notes a burning sensation at times to the right lower extremity.  No bowel or bladder change or saddle paresthesias.  She is status post lumbar surgery by Dr. Ronnald Ramp several years ago.  She has not had an epidural steroid injection or facet block in a while.  No previous injection to the right hip.  Review of Systems as detailed in HPI.  All others reviewed and are negative.   Objective: Vital Signs: There were no vitals taken for this visit.  Physical Exam well-developed well-nourished female in no acute distress.  Alert and oriented x3.  Ortho Exam lumbar spine exam reveals marked tenderness to the right lower paraspinous musculature.  She has a negative straight leg raise.  She does have a positive logroll and positive FADIR.  No focal weakness.  She has increased pain with lumbar flexion.  No pain with extension or rotation.  She is neurovascular intact distally.  Specialty Comments:  No specialty comments available.  Imaging: XR Lumbar Spine 2-3 Views  Result Date: 07/10/2020 X-rays reveal multilevel spondylosis, degenerative scoliosis and moderate facet changes.    PMFS History: Patient Active Problem List   Diagnosis Date Noted  . Pulmonary hypertension, unspecified ([place]) 05/25/2019  . Educated about COVID-19 virus infection 05/24/2019  . Obstructive hypertrophic cardiomyopathy ([place]) 05/24/2019  . OAB (overactive bladder) 09/29/2017  . OSA (obstructive sleep apnea) 09/29/2017  . Urge urinary incontinence 09/29/2017  . Fibromyalgia 11/11/2016  . Atrial septal hypertrophy 10/21/2015  . Paresthesia of both feet 07/31/2015  . Bradycardia 08/14/2014  . Obesity 10/09/2013  . Chest pain 10/08/2013  . Factor V Leiden carrier ([place]) 08/26/2011  . Protein S  deficiency ([place]) 08/26/2011  . MORBID OBESITY 06/06/2010  . DEPRESSION 06/06/2010  . RESTLESS LEG SYNDROME 06/06/2010  . Essential hypertension 06/06/2010  . ALLERGIC RHINITIS 06/06/2010  . GERD 06/06/2010  . FATTY LIVER DISEASE 06/06/2010  . OSTEOARTHRITIS 06/06/2010  . MYOFASCIAL PAIN SYNDROME 06/06/2010  . EDEMA 06/06/2010  . DYSPNEA 06/06/2010  . CHEST PAIN UNSPECIFIED 06/06/2010   Past Medical History:  Diagnosis Date  . Allergic rhinitis   . Arthritis   . Depression   . Factor V Leiden carrier ([place]) 08/26/2011  . Fatty liver   . Fibroids    dysfuction uterine bleeding  . GERD (gastroesophageal reflux disease)   . Hypertension   . Osteoarthritis   . Peripheral neuropathy   . Psoriatic arthritis ([place])   . Restless leg syndrome     Family History  Problem Relation Age of Onset  . Coronary artery disease Father 24       deceased- TIAs  . Stroke Father   . Hypertension Mother 69       alive  . Deep vein thrombosis Mother   . Neuropathy Mother   . Osteoporosis Mother   . Liver disease Sister   . Other Sister        ITP  . Cancer Sister        breast  . Heart failure Maternal Grandmother     Past Surgical History:  Procedure Laterality Date  . CHOLECYSTECTOMY    . KNEE SURGERY     right TKR  . LUMBAR SPINE SURGERY    . TUBAL LIGATION     bilateral  . URETHRAL SLING    . VAGINAL HYSTERECTOMY     Social History   Occupational History  . 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 Smoking status: Never Smoker  . Smokeless tobacco: Never Used  Vaping Use  . Vaping Use: Never used  Substance and Sexual Activity  . Alcohol use: No    Alcohol/week: 0.0 standard drinks  . Drug use: No  . Sexual activity: Not on file

## 2020-07-17 ENCOUNTER — Ambulatory Visit: Payer: Medicare PPO | Attending: Orthopaedic Surgery | Admitting: Physical Therapy

## 2020-07-17 ENCOUNTER — Other Ambulatory Visit: Payer: Self-pay

## 2020-07-17 ENCOUNTER — Encounter: Payer: Self-pay | Admitting: Physical Therapy

## 2020-07-17 DIAGNOSIS — M25552 Pain in left hip: Secondary | ICD-10-CM | POA: Insufficient documentation

## 2020-07-17 DIAGNOSIS — M545 Low back pain, unspecified: Secondary | ICD-10-CM | POA: Insufficient documentation

## 2020-07-17 DIAGNOSIS — M25652 Stiffness of left hip, not elsewhere classified: Secondary | ICD-10-CM | POA: Diagnosis not present

## 2020-07-17 DIAGNOSIS — G8929 Other chronic pain: Secondary | ICD-10-CM | POA: Diagnosis not present

## 2020-07-17 DIAGNOSIS — M25551 Pain in right hip: Secondary | ICD-10-CM | POA: Diagnosis not present

## 2020-07-17 DIAGNOSIS — M25651 Stiffness of right hip, not elsewhere classified: Secondary | ICD-10-CM | POA: Insufficient documentation

## 2020-07-17 DIAGNOSIS — M62838 Other muscle spasm: Secondary | ICD-10-CM | POA: Insufficient documentation

## 2020-07-17 DIAGNOSIS — R29898 Other symptoms and signs involving the musculoskeletal system: Secondary | ICD-10-CM | POA: Diagnosis not present

## 2020-07-17 NOTE — Therapy (Signed)
[facility] [address]  [place], [place], [address] Phone: [phone]   Fax:  [phone]  Physical Therapy Treatment  Patient Details  Name: [name] [name] MRN: [id number] Date of Birth: [birth year] Referring Provider (PT): Frankey Shown, MD   Encounter Date: 07/17/2020   PT End of Session - 07/17/20 1059    Visit Number 5    Number of Visits 11    Date for PT Re-Evaluation 08/28/20    Authorization Type [employer]    Authorization Time Period 5 visits approved 06/04/20-07/16/20    Authorization - Visit Number 2    Authorization - Number of Visits 5    PT Start Time 1010    PT Stop Time 1105    PT Time Calculation (min) 55 min    Activity Tolerance Patient tolerated treatment well;Patient limited by pain    Behavior During Therapy [facility] for tasks assessed/performed           Past Medical History:  Diagnosis Date  . Allergic rhinitis   . Arthritis   . Depression   . Factor V Leiden carrier ([place]) 08/26/2011  . Fatty liver   . Fibroids    dysfuction uterine bleeding  . GERD (gastroesophageal reflux disease)   . Hypertension   . Osteoarthritis   . Peripheral neuropathy   . Psoriatic arthritis ([place])   . Restless leg syndrome     Past Surgical History:  Procedure Laterality Date  . CHOLECYSTECTOMY    . KNEE SURGERY     right TKR  . LUMBAR SPINE SURGERY    . TUBAL LIGATION     bilateral  . URETHRAL SLING    . VAGINAL HYSTERECTOMY      There were no vitals filed for this visit.   Subjective Assessment - 07/17/20 1012    Subjective Bringing in referral from [facility] stating "please add exercises/stretches/modalities for chronic LBP." Has flared up her LBP and unsure if it is d/t PT or if it is d/t the raking and tilling she has been doing in the yard. LBP is located over the midline LBP with radiation to the R hip. Notes some improvement with meds. Denies N/T. Reports weakness in the LEs.  Worse with bending forward, sweeping, mopping, vacuum, lifting. Reports no recent pain in the L proximal HS, but still notes weakness, especially with stairs.    Pertinent History psoriatic arthritis, peripheral neuropathy, HTN, GERD, depression, R TKA, lumbar spine surgery    Diagnostic tests none    Patient Stated Goals decrease pain    Currently in Pain? Yes    Multiple Pain Sites Yes    Pain Score 4    Pain Location Back    Pain Orientation Lower   midline   Pain Descriptors / Indicators Sharp;Dull    Pain Type Chronic pain;Acute pain    Pain Radiating Towards to the R hip              [facility] PT Assessment - 07/17/20 1024      Assessment   Medical Diagnosis Hamstring tendinitis of L thigh    Referring Provider (PT) Frankey Shown, MD    Onset Date/Surgical Date 04/06/20      AROM   AROM Assessment Site Lumbar    Right Hip Flexion 93   pain in hip   Right Hip External Rotation  35    Right Hip Internal Rotation  23    Left Hip Flexion 85  Left Hip External Rotation  33    Left Hip Internal Rotation  26    Lumbar Flexion ankles   "pulling"   Lumbar Extension mod-severely limited   pain   Lumbar - Right Side Bend distal thigh   pain   Lumbar - Left Side Bend distal thigh   pain   Lumbar - Right Rotation moderately limited   pain   Lumbar - Left Rotation severely limited      Strength   Right Hip Flexion 4+/5    Right Hip ABduction 4+/5    Right Hip ADduction 4/5    Left Hip Flexion 4+/5    Left Hip ABduction 4+/5    Left Hip ADduction 4/5    Right Knee Flexion 5/5    Right Knee Extension 5/5    Left Knee Flexion 5/5    Left Knee Extension 5/5    Right Ankle Dorsiflexion 4+/5    Right Ankle Plantar Flexion 4+/5    Left Ankle Dorsiflexion 4+/5    Left Ankle Plantar Flexion 4+/5      Flexibility   Piriformis R mod-severely tight in fig 4 and KTOS, L moderately tight      Palpation   Palpation comment very TTP and considerable soft tissue restriction in B lumbar  paraspinals, QL, proximal glutes, and piriformis; TTP over midline of L4/5                         [facility] Adult PT Treatment/Exercise - 07/17/20 0001      Modalities   Modalities Moist Heat      Moist Heat Therapy   Number Minutes Moist Heat 10 Minutes    Moist Heat Location Lumbar Spine                  PT Education - 07/17/20 1058    Education Details update/consolidation of HEP; discussion on objective and subjective progress and remaining deficits    Person(s) Educated Patient    Methods Explanation;Demonstration;Tactile cues;Verbal cues;Handout    Comprehension Verbalized understanding;Returned demonstration            PT Short Term Goals - 07/17/20 1020      PT SHORT TERM GOAL #1   Title Patient to be independent with initial HEP.    Time 3    Period Weeks    Status Partially Met   reports partial compliance d/t LBP   Target Date 08/07/20             PT Long Term Goals - 07/17/20 1020      PT LONG TERM GOAL #1   Title Patient to be independent with advanced HEP.    Time 6    Period Weeks    Status Partially Met   reports partial compliance d/t LBP   Target Date 08/28/20      PT LONG TERM GOAL #2   Title Patient to demonstrate B hip AROM WFL and without pain limiting.    Time 6    Period Weeks    Status Partially Met   improved in B hip ER, still limited     PT LONG TERM GOAL #3   Title Patient to demonstrate B piriformis flexibility with mild-moderate tightness remaining.    Time 6    Period Weeks    Status On-going   improved in L hip, B tightness still remaining   Target Date 08/28/20      PT LONG TERM  GOAL #4   Title Patient to report tolerance for sitting without limitation in order to tolerate work duties.    Time 6    Period Weeks    Status Achieved   "been great" no longer having issues     PT LONG TERM GOAL #5   Title Patient to report ability to bend over to pick something off the ground without pain limiting.     Time 6    Period Weeks    Status Partially Met   met for L HS pain, but now LBP is limiting   Target Date 08/28/20      Additional Long Term Goals   Additional Long Term Goals Yes      PT LONG TERM GOAL #6   Title Patient to demonstrate lumbar AROM WFL and without pain limiting.    Time 6    Period Weeks    Status New    Target Date 08/28/20                 Plan - 07/17/20 1100    Clinical Impression Statement Patient arrived to session with referral from referring MD office stating "please add exercises/stretches/modalities for chronic LBP." Notes that she flared up her LBP- unsure if d/t her PT exercises or d/t the raking and tilling she had been doing in the garden. Pain is located over the midline of the LB with radiation to the R hip. Denies N/T. Worse with bending forward, sweeping, mopping, vacuum, lifting. Notes improved L proximal HS pain. Patient no longer has pain in sitting while at work as she has adjusted her desk chair. Notes that her L proximal HS pain is resolved with bending, however now the LB is limiting with this activity. L piriformis flexibility has improved, now with R side more limiting. B hip ER AROM has improved, however still limited in IR and flexion. Assessed Lumbar AROM, which is painful and limited in all planes of motion. Strength testing revealed good improvement, now with B hip adduction only limiting. L proximal HS pain has seems to have resolved at this time, with LBP as primary focus now. Updated goals to address this. Patient reported understanding of HEP update. Ended session with moist heat to LB for pain relief. No complaints at end of session. Patient would benefit from additional skilled PT services 1x/week for 6 weeks to address aforementioned impairments.    Personal Factors and Comorbidities Age;Comorbidity 3+;Past/Current Experience;Profession;Time since onset of injury/illness/exacerbation    Comorbidities psoriatic arthritis, peripheral  neuropathy, HTN, GERD, depression, R TKA, lumbar spine surgery    PT Frequency 1x / week    PT Duration 6 weeks    PT Treatment/Interventions ADLs/Self Care Home Management;Cryotherapy;Electrical Stimulation;Iontophoresis 41m/ml Dexamethasone;Moist Heat;Balance training;Therapeutic exercise;Therapeutic activities;Functional mobility training;Stair training;Gait training;Ultrasound;Neuromuscular re-education;Patient/family education;Manual techniques;Taping;Energy conservation;Dry needling;Passive range of motion    PT Next Visit Plan review HEP; lumbar and hip mobility; modalities PRN    Consulted and Agree with Plan of Care Patient           Patient will benefit from skilled therapeutic intervention in order to improve the following deficits and impairments:  Hypomobility,Decreased activity tolerance,Decreased strength,Pain,Increased fascial restricitons,Difficulty walking,Increased muscle spasms,Improper body mechanics,Decreased range of motion,Impaired flexibility,Postural dysfunction  Visit Diagnosis: Chronic midline low back pain without sciatica  Pain in right hip  Pain in left hip  Stiffness of right hip, not elsewhere classified  Stiffness of left hip, not elsewhere classified  Other muscle spasm  Other symptoms and signs involving  the musculoskeletal system     Problem List Patient Active Problem List   Diagnosis Date Noted  . Pulmonary hypertension, unspecified ([place]) 05/25/2019  . Educated about COVID-19 virus infection 05/24/2019  . Obstructive hypertrophic cardiomyopathy ([place]) 05/24/2019  . OAB (overactive bladder) 09/29/2017  . OSA (obstructive sleep apnea) 09/29/2017  . Urge urinary incontinence 09/29/2017  . Fibromyalgia 11/11/2016  . Atrial septal hypertrophy 10/21/2015  . Paresthesia of both feet 07/31/2015  . Bradycardia 08/14/2014  . Obesity 10/09/2013  . Chest pain 10/08/2013  . Factor V Leiden carrier ([place]) 08/26/2011  . Protein S deficiency ([place])  08/26/2011  . MORBID OBESITY 06/06/2010  . DEPRESSION 06/06/2010  . RESTLESS LEG SYNDROME 06/06/2010  . Essential hypertension 06/06/2010  . ALLERGIC RHINITIS 06/06/2010  . GERD 06/06/2010  . FATTY LIVER DISEASE 06/06/2010  . OSTEOARTHRITIS 06/06/2010  . MYOFASCIAL PAIN SYNDROME 06/06/2010  . EDEMA 06/06/2010  . DYSPNEA 06/06/2010  . CHEST PAIN UNSPECIFIED 06/06/2010     Janene Harvey, PT, DPT 07/17/20 11:18 AM   [facility] [address]  [place], [place], [address] Phone: [phone]   Fax:  [phone]  Name: [name] [name] MRN: [id number] Date of Birth: 08-Aug-1955

## 2020-07-24 ENCOUNTER — Ambulatory Visit: Payer: Medicare PPO

## 2020-07-24 ENCOUNTER — Other Ambulatory Visit: Payer: Self-pay

## 2020-07-24 DIAGNOSIS — G8929 Other chronic pain: Secondary | ICD-10-CM

## 2020-07-24 DIAGNOSIS — M25551 Pain in right hip: Secondary | ICD-10-CM | POA: Diagnosis not present

## 2020-07-24 DIAGNOSIS — M25552 Pain in left hip: Secondary | ICD-10-CM

## 2020-07-24 DIAGNOSIS — R29898 Other symptoms and signs involving the musculoskeletal system: Secondary | ICD-10-CM | POA: Diagnosis not present

## 2020-07-24 DIAGNOSIS — M62838 Other muscle spasm: Secondary | ICD-10-CM

## 2020-07-24 DIAGNOSIS — M25651 Stiffness of right hip, not elsewhere classified: Secondary | ICD-10-CM | POA: Diagnosis not present

## 2020-07-24 DIAGNOSIS — M25652 Stiffness of left hip, not elsewhere classified: Secondary | ICD-10-CM | POA: Diagnosis not present

## 2020-07-24 DIAGNOSIS — M545 Low back pain, unspecified: Secondary | ICD-10-CM | POA: Diagnosis not present

## 2020-07-24 NOTE — Therapy (Signed)
[facility] [address]  [place], [place], [address] Phone: [phone]   Fax:  [phone]  Physical Therapy Treatment  Patient Details  Name: [name] [name] MRN: [id number] Date of Birth: 03/03/1956 Referring Provider (PT): Frankey Shown, MD   Encounter Date: 07/24/2020   PT End of Session - 07/24/20 1039    Visit Number 6    Number of Visits 11    Date for PT Re-Evaluation 08/28/20    Authorization Type [employer]    Authorization Time Period 6 visits approved until 08/28/20    Authorization - Visit Number 2    Authorization - Number of Visits 6    PT Start Time 0935    PT Stop Time 1025    PT Time Calculation (min) 50 min    Activity Tolerance Patient tolerated treatment well;Patient limited by pain    Behavior During Therapy [facility] for tasks assessed/performed           Past Medical History:  Diagnosis Date  . Allergic rhinitis   . Arthritis   . Depression   . Factor V Leiden carrier ([place]) 08/26/2011  . Fatty liver   . Fibroids    dysfuction uterine bleeding  . GERD (gastroesophageal reflux disease)   . Hypertension   . Osteoarthritis   . Peripheral neuropathy   . Psoriatic arthritis ([place])   . Restless leg syndrome     Past Surgical History:  Procedure Laterality Date  . CHOLECYSTECTOMY    . KNEE SURGERY     right TKR  . LUMBAR SPINE SURGERY    . TUBAL LIGATION     bilateral  . URETHRAL SLING    . VAGINAL HYSTERECTOMY      There were no vitals filed for this visit.   Subjective Assessment - 07/24/20 0936    Subjective Pt reports her arthritis has been acting up, mowed the yard yesterday her low back bothered her while doing it.    Pertinent History psoriatic arthritis, peripheral neuropathy, HTN, GERD, depression, R TKA, lumbar spine surgery    Diagnostic tests none    Patient Stated Goals decrease pain    Currently in Pain? Yes    Pain Score 4     Pain Location Back   B hips    Pain Orientation Lower    Pain Descriptors / Indicators Constant                             [facility] Adult PT Treatment/Exercise - 07/24/20 0001      Exercises   Exercises Knee/Hip;Lumbar      Lumbar Exercises: Standing   Functional Squats 10 reps    Functional Squats Limitations TRX squats cues to increase L WS      Lumbar Exercises: Sidelying   Other Sidelying Lumbar Exercises R/L open book 10x3"      Lumbar Exercises: Prone   Other Prone Lumbar Exercises prone lumbar extension 10x3"      Knee/Hip Exercises: Standing   Hip Flexion Stengthening;Both;1 set;10 reps;Knee bent    Hip Flexion Limitations R Tband around foot      Modalities   Modalities Moist Heat      Moist Heat Therapy   Number Minutes Moist Heat 10 Minutes    Moist Heat Location Lumbar Spine      Manual Therapy   Manual Therapy Soft tissue mobilization;Myofascial release    Soft tissue  mobilization DTM to B lumbar paraspinals and QL    Myofascial Release manual TPR to B lumbar paraspinals                  PT Education - 07/24/20 1028    Education Details HEp update: Access Code: [email or web address]    Person(s) Educated Patient    Methods Explanation;Verbal cues;Handout    Comprehension Verbalized understanding;Returned demonstration;Verbal cues required            PT Short Term Goals - 07/17/20 1020      PT SHORT TERM GOAL #1   Title Patient to be independent with initial HEP.    Time 3    Period Weeks    Status Partially Met   reports partial compliance d/t LBP   Target Date 08/07/20             PT Long Term Goals - 07/17/20 1020      PT LONG TERM GOAL #1   Title Patient to be independent with advanced HEP.    Time 6    Period Weeks    Status Partially Met   reports partial compliance d/t LBP   Target Date 08/28/20      PT LONG TERM GOAL #2   Title Patient to demonstrate B hip AROM WFL and without pain limiting.    Time 6    Period Weeks    Status Partially Met    improved in B hip ER, still limited     PT LONG TERM GOAL #3   Title Patient to demonstrate B piriformis flexibility with mild-moderate tightness remaining.    Time 6    Period Weeks    Status On-going   improved in L hip, B tightness still remaining   Target Date 08/28/20      PT LONG TERM GOAL #4   Title Patient to report tolerance for sitting without limitation in order to tolerate work duties.    Time 6    Period Weeks    Status Achieved   "been great" no longer having issues     PT LONG TERM GOAL #5   Title Patient to report ability to bend over to pick something off the ground without pain limiting.    Time 6    Period Weeks    Status Partially Met   met for L HS pain, but now LBP is limiting   Target Date 08/28/20      Additional Long Term Goals   Additional Long Term Goals Yes      PT LONG TERM GOAL #6   Title Patient to demonstrate lumbar AROM WFL and without pain limiting.    Time 6    Period Weeks    Status New    Target Date 08/28/20                 Plan - 07/24/20 1203    Clinical Impression Statement Focused mostly on lumbar AROM this session. Pt noted a lot of tightness with the prone extensions and open book exercises. Gave her instructions on proper squat form, she showed a good performance after this. Also noted increased soft tissue restriction in B throacolumbar paraspinals with some TTP. She had a good response to MT with reports of  "less tightness in her back muscles". Ended session with heat for pain and to increase tissue extensibility.  She would benefit from [facility] and continued thoracolumbar mobility to address soft tissue restrictions.    Personal Factors  and Comorbidities Age;Comorbidity 3+;Past/Current Experience;Profession;Time since onset of injury/illness/exacerbation    Comorbidities psoriatic arthritis, peripheral neuropathy, HTN, GERD, depression, R TKA, lumbar spine surgery    PT Frequency 1x / week    PT Duration 6 weeks    PT  Treatment/Interventions ADLs/Self Care Home Management;Cryotherapy;Electrical Stimulation;Iontophoresis 28m/ml Dexamethasone;Moist Heat;Balance training;Therapeutic exercise;Therapeutic activities;Functional mobility training;Stair training;Gait training;Ultrasound;Neuromuscular re-education;Patient/family education;Manual techniques;Taping;Energy conservation;Dry needling;Passive range of motion    PT Next Visit Plan review HEP; lumbar and hip mobility; modalities PRN    Consulted and Agree with Plan of Care Patient           Patient will benefit from skilled therapeutic intervention in order to improve the following deficits and impairments:  Hypomobility,Decreased activity tolerance,Decreased strength,Pain,Increased fascial restricitons,Difficulty walking,Increased muscle spasms,Improper body mechanics,Decreased range of motion,Impaired flexibility,Postural dysfunction  Visit Diagnosis: Chronic midline low back pain without sciatica  Pain in right hip  Pain in left hip  Stiffness of right hip, not elsewhere classified  Stiffness of left hip, not elsewhere classified  Other muscle spasm  Other symptoms and signs involving the musculoskeletal system     Problem List Patient Active Problem List   Diagnosis Date Noted  . Pulmonary hypertension, unspecified ([place] 05/25/2019  . Educated about COVID-19 virus infection 05/24/2019  . Obstructive hypertrophic cardiomyopathy ([place] 05/24/2019  . OAB (overactive bladder) 09/29/2017  . OSA (obstructive sleep apnea) 09/29/2017  . Urge urinary incontinence 09/29/2017  . Fibromyalgia 11/11/2016  . Atrial septal hypertrophy 10/21/2015  . Paresthesia of both feet 07/31/2015  . Bradycardia 08/14/2014  . Obesity 10/09/2013  . Chest pain 10/08/2013  . Factor V Leiden carrier ([place] 08/26/2011  . Protein S deficiency ([place] 08/26/2011  . MORBID OBESITY 06/06/2010  . DEPRESSION 06/06/2010  . RESTLESS LEG SYNDROME 06/06/2010  . Essential  hypertension 06/06/2010  . ALLERGIC RHINITIS 06/06/2010  . GERD 06/06/2010  . FATTY LIVER DISEASE 06/06/2010  . OSTEOARTHRITIS 06/06/2010  . MYOFASCIAL PAIN SYNDROME 06/06/2010  . EDEMA 06/06/2010  . DYSPNEA 06/06/2010  . CHEST PAIN UNSPECIFIED 06/06/2010    BArtist Pais PTA 07/24/2020, 12:10 PM  [facility] [place] [address]Phone: [phone]  Fax:  [phone] Name: [name] COONRADTMRN: [id number] of Birth: 101/04/1955

## 2020-07-31 ENCOUNTER — Other Ambulatory Visit: Payer: Self-pay

## 2020-07-31 ENCOUNTER — Encounter: Payer: Self-pay | Admitting: Physical Therapy

## 2020-07-31 ENCOUNTER — Ambulatory Visit: Payer: Medicare PPO | Attending: Orthopaedic Surgery | Admitting: Physical Therapy

## 2020-07-31 DIAGNOSIS — M25651 Stiffness of right hip, not elsewhere classified: Secondary | ICD-10-CM | POA: Insufficient documentation

## 2020-07-31 DIAGNOSIS — M62838 Other muscle spasm: Secondary | ICD-10-CM | POA: Diagnosis not present

## 2020-07-31 DIAGNOSIS — R29898 Other symptoms and signs involving the musculoskeletal system: Secondary | ICD-10-CM | POA: Diagnosis not present

## 2020-07-31 DIAGNOSIS — M25652 Stiffness of left hip, not elsewhere classified: Secondary | ICD-10-CM | POA: Insufficient documentation

## 2020-07-31 DIAGNOSIS — G8929 Other chronic pain: Secondary | ICD-10-CM | POA: Diagnosis not present

## 2020-07-31 DIAGNOSIS — M25552 Pain in left hip: Secondary | ICD-10-CM | POA: Diagnosis not present

## 2020-07-31 DIAGNOSIS — M545 Low back pain, unspecified: Secondary | ICD-10-CM | POA: Diagnosis not present

## 2020-07-31 DIAGNOSIS — M25551 Pain in right hip: Secondary | ICD-10-CM | POA: Insufficient documentation

## 2020-07-31 NOTE — Therapy (Signed)
[facility] [address]  [place], [place], [address] Phone: [phone]   Fax:  [phone]  Physical Therapy Treatment  Patient Details  Name: [name] [name] MRN: [id number] Date of Birth: 01-May-1955 Referring Provider (PT): Frankey Shown, MD   Encounter Date: 07/31/2020   PT End of Session - 07/31/20 1012    Visit Number 7    Number of Visits 11    Date for PT Re-Evaluation 08/28/20    Authorization Type [employer]    Authorization Time Period 6 visits approved until 08/28/20    Authorization - Visit Number 3    Authorization - Number of Visits 6    PT Start Time 0931    PT Stop Time 1011    PT Time Calculation (min) 40 min    Activity Tolerance Patient tolerated treatment well    Behavior During Therapy [facility] for tasks assessed/performed           Past Medical History:  Diagnosis Date  . Allergic rhinitis   . Arthritis   . Depression   . Factor V Leiden carrier ([place]) 08/26/2011  . Fatty liver   . Fibroids    dysfuction uterine bleeding  . GERD (gastroesophageal reflux disease)   . Hypertension   . Osteoarthritis   . Peripheral neuropathy   . Psoriatic arthritis ([place])   . Restless leg syndrome     Past Surgical History:  Procedure Laterality Date  . CHOLECYSTECTOMY    . KNEE SURGERY     right TKR  . LUMBAR SPINE SURGERY    . TUBAL LIGATION     bilateral  . URETHRAL SLING    . VAGINAL HYSTERECTOMY      There were no vitals filed for this visit.   Subjective Assessment - 07/31/20 0935    Subjective Noting some LE weakness which she notices when doing her HEP.    Pertinent History psoriatic arthritis, peripheral neuropathy, HTN, GERD, depression, R TKA, lumbar spine surgery    Diagnostic tests none    Patient Stated Goals decrease pain    Currently in Pain? Yes    Pain Score 2     Pain Location Back    Pain Orientation Lower    Pain Descriptors / Indicators Constant    Pain Type  Acute pain                             [facility] Adult PT Treatment/Exercise - 07/31/20 0001      Lumbar Exercises: Stretches   Lower Trunk Rotation Limitations x10 to tolerance    Piriformis Stretch Right;Left;1 rep;30 seconds    Piriformis Stretch Limitations 1 each of KTOS and fig 4   strap assist   Other Lumbar Stretch Exercise prayer stretch with green pball 5x5" anterior, R, L      Lumbar Exercises: Aerobic   Nustep L4 x 6 min (UEs/LEs)      Lumbar Exercises: Supine   Other Supine Lumbar Exercises hooklying pillow squeeze 10x10"      Lumbar Exercises: Sidelying   Other Sidelying Lumbar Exercises R/L open book 10x3"      Lumbar Exercises: Prone   Other Prone Lumbar Exercises partial prone press up on hands 10x3"   c/o hips tightening up; no pain     Knee/Hip Exercises: Standing   Hip Abduction Stengthening;Both;1 set;10 reps;Knee straight    Abduction Limitations more challenge to maintain  upriught postue when standing on L LE    Functional Squat 1 set;10 reps    Functional Squat Limitations 2HHA at counter   cues to avoid excessive hanging on counter   Other Standing Knee Exercises TRX squat 10x   good form                 PT Education - 07/31/20 1011    Education Details detailed review of HEP    Person(s) Educated Patient    Methods Explanation;Demonstration;Tactile cues;Verbal cues    Comprehension Verbalized understanding;Returned demonstration            PT Short Term Goals - 07/17/20 1020      PT SHORT TERM GOAL #1   Title Patient to be independent with initial HEP.    Time 3    Period Weeks    Status Partially Met   reports partial compliance d/t LBP   Target Date 08/07/20             PT Long Term Goals - 07/31/20 1012      PT LONG TERM GOAL #1   Title Patient to be independent with advanced HEP.    Time 6    Period Weeks    Status Partially Met   reports partial compliance d/t LBP     PT LONG TERM GOAL #2   Title  Patient to demonstrate B hip AROM WFL and without pain limiting.    Time 6    Period Weeks    Status Partially Met   improved in B hip ER, still limited     PT LONG TERM GOAL #3   Title Patient to demonstrate B piriformis flexibility with mild-moderate tightness remaining.    Time 6    Period Weeks    Status On-going   improved in L hip, B tightness still remaining     PT LONG TERM GOAL #4   Title Patient to report tolerance for sitting without limitation in order to tolerate work duties.    Time 6    Period Weeks    Status Achieved   "been great" no longer having issues     PT LONG TERM GOAL #5   Title Patient to report ability to bend over to pick something off the ground without pain limiting.    Time 6    Period Weeks    Status Partially Met   met for L HS pain, but now LBP is limiting     PT LONG TERM GOAL #6   Title Patient to demonstrate lumbar AROM WFL and without pain limiting.    Time 6    Period Weeks    Status On-going                 Plan - 07/31/20 1012    Clinical Impression Statement Patient arrived to session with report of noticing some LE weakness when going her HEP. Reviewed previous HEP updates with intermittent cueing for form to assess patient's tolerance and carryover. Strap assist was provided to improve positioning with hip stretching. Lumbar ROM seemed to still be limited in rotation and extension motions. Patient did report tightness in her hips with prone press up but denied pain. Offered prone on elbows as a more comfortable/lower intensity option. Patient did describe LE muscle fatigue after performing first set of squats but reported pain as 0/10. Encouraged patient to continue her HEP d/t good tolerance. Patient reported understanding and without complaints at end of session.  Personal Factors and Comorbidities Age;Comorbidity 3+;Past/Current Experience;Profession;Time since onset of injury/illness/exacerbation    Comorbidities psoriatic  arthritis, peripheral neuropathy, HTN, GERD, depression, R TKA, lumbar spine surgery    PT Frequency 1x / week    PT Duration 6 weeks    PT Treatment/Interventions ADLs/Self Care Home Management;Cryotherapy;Electrical Stimulation;Iontophoresis 63m/ml Dexamethasone;Moist Heat;Balance training;Therapeutic exercise;Therapeutic activities;Functional mobility training;Stair training;Gait training;Ultrasound;Neuromuscular re-education;Patient/family education;Manual techniques;Taping;Energy conservation;Dry needling;Passive range of motion    PT Next Visit Plan lumbar and hip mobility; modalities PRN    Consulted and Agree with Plan of Care Patient           Patient will benefit from skilled therapeutic intervention in order to improve the following deficits and impairments:  Hypomobility,Decreased activity tolerance,Decreased strength,Pain,Increased fascial restricitons,Difficulty walking,Increased muscle spasms,Improper body mechanics,Decreased range of motion,Impaired flexibility,Postural dysfunction  Visit Diagnosis: Chronic midline low back pain without sciatica  Pain in right hip  Pain in left hip  Stiffness of right hip, not elsewhere classified  Stiffness of left hip, not elsewhere classified  Other muscle spasm  Other symptoms and signs involving the musculoskeletal system     Problem List Patient Active Problem List   Diagnosis Date Noted  . Pulmonary hypertension, unspecified ([place] 05/25/2019  . Educated about COVID-19 virus infection 05/24/2019  . Obstructive hypertrophic cardiomyopathy ([place] 05/24/2019  . OAB (overactive bladder) 09/29/2017  . OSA (obstructive sleep apnea) 09/29/2017  . Urge urinary incontinence 09/29/2017  . Fibromyalgia 11/11/2016  . Atrial septal hypertrophy 10/21/2015  . Paresthesia of both feet 07/31/2015  . Bradycardia 08/14/2014  . Obesity 10/09/2013  . Chest pain 10/08/2013  . Factor V Leiden carrier ([place] 08/26/2011  . Protein S  deficiency ([place] 08/26/2011  . MORBID OBESITY 06/06/2010  . DEPRESSION 06/06/2010  . RESTLESS LEG SYNDROME 06/06/2010  . Essential hypertension 06/06/2010  . ALLERGIC RHINITIS 06/06/2010  . GERD 06/06/2010  . FATTY LIVER DISEASE 06/06/2010  . OSTEOARTHRITIS 06/06/2010  . MYOFASCIAL PAIN SYNDROME 06/06/2010  . EDEMA 06/06/2010  . DYSPNEA 06/06/2010  . CHEST PAIN UNSPECIFIED 06/06/2010     YJanene Harvey PT, DPT 07/31/20 10:14 AM   [facility] [place] [address]Phone: [phone]  Fax:  [phone] Name: [name] FENTRESSMRN: [id number] of Birth: 106/29/1957

## 2020-08-02 DIAGNOSIS — L503 Dermatographic urticaria: Secondary | ICD-10-CM | POA: Diagnosis not present

## 2020-08-02 DIAGNOSIS — L300 Nummular dermatitis: Secondary | ICD-10-CM | POA: Diagnosis not present

## 2020-08-02 DIAGNOSIS — L821 Other seborrheic keratosis: Secondary | ICD-10-CM | POA: Diagnosis not present

## 2020-08-06 DIAGNOSIS — I10 Essential (primary) hypertension: Secondary | ICD-10-CM | POA: Diagnosis not present

## 2020-08-06 DIAGNOSIS — N3281 Overactive bladder: Secondary | ICD-10-CM | POA: Diagnosis not present

## 2020-08-07 ENCOUNTER — Ambulatory Visit: Payer: Medicare PPO

## 2020-08-07 ENCOUNTER — Other Ambulatory Visit: Payer: Self-pay

## 2020-08-07 DIAGNOSIS — M545 Low back pain, unspecified: Secondary | ICD-10-CM

## 2020-08-07 DIAGNOSIS — M25652 Stiffness of left hip, not elsewhere classified: Secondary | ICD-10-CM

## 2020-08-07 DIAGNOSIS — M25552 Pain in left hip: Secondary | ICD-10-CM | POA: Diagnosis not present

## 2020-08-07 DIAGNOSIS — R29898 Other symptoms and signs involving the musculoskeletal system: Secondary | ICD-10-CM | POA: Diagnosis not present

## 2020-08-07 DIAGNOSIS — G8929 Other chronic pain: Secondary | ICD-10-CM | POA: Diagnosis not present

## 2020-08-07 DIAGNOSIS — M25551 Pain in right hip: Secondary | ICD-10-CM | POA: Diagnosis not present

## 2020-08-07 DIAGNOSIS — M25651 Stiffness of right hip, not elsewhere classified: Secondary | ICD-10-CM | POA: Diagnosis not present

## 2020-08-07 DIAGNOSIS — M62838 Other muscle spasm: Secondary | ICD-10-CM

## 2020-08-07 NOTE — Therapy (Signed)
[facility] [address]  [place], [place], [address] Phone: [phone]   Fax:  [phone]  Physical Therapy Treatment  Patient Details  Name: [name] [name] MRN: [id number] Date of Birth: 02-12-1956 Referring Provider (PT): Frankey Shown, MD   Encounter Date: 08/07/2020   PT End of Session - 08/07/20 1012    Visit Number 8    Number of Visits 11    Date for PT Re-Evaluation 08/28/20    Authorization Type [employer]    Authorization Time Period 6 visits approved until 08/28/20    Authorization - Visit Number 4    Authorization - Number of Visits 6    PT Start Time 0931    PT Stop Time 1012    PT Time Calculation (min) 41 min    Activity Tolerance Patient tolerated treatment well    Behavior During Therapy [facility] for tasks assessed/performed           Past Medical History:  Diagnosis Date  . Allergic rhinitis   . Arthritis   . Depression   . Factor V Leiden carrier ([place]) 08/26/2011  . Fatty liver   . Fibroids    dysfuction uterine bleeding  . GERD (gastroesophageal reflux disease)   . Hypertension   . Osteoarthritis   . Peripheral neuropathy   . Psoriatic arthritis ([place])   . Restless leg syndrome     Past Surgical History:  Procedure Laterality Date  . CHOLECYSTECTOMY    . KNEE SURGERY     right TKR  . LUMBAR SPINE SURGERY    . TUBAL LIGATION     bilateral  . URETHRAL SLING    . VAGINAL HYSTERECTOMY      There were no vitals filed for this visit.   Subjective Assessment - 08/07/20 0932    Subjective Pt reports that her arthritis has been acting up today in her hands.    Pertinent History psoriatic arthritis, peripheral neuropathy, HTN, GERD, depression, R TKA, lumbar spine surgery    Diagnostic tests none    Patient Stated Goals decrease pain    Currently in Pain? Yes    Pain Score 4     Pain Location Back    Pain Orientation Lower;Right;Left    Pain Type Acute pain    Pain  Radiating Towards spreads out to the hip                             [facility] Adult PT Treatment/Exercise - 08/07/20 0001      Lumbar Exercises: Stretches   Active Hamstring Stretch Right;Left;2 reps;30 seconds    Active Hamstring Stretch Limitations seated hip hinge    Lower Trunk Rotation Limitations 10x5" R/L supine    Press Ups 10 reps    Press Ups Limitations 10x3"    Piriformis Stretch Right;Left;30 seconds    Piriformis Stretch Limitations supine with towel assist    Figure 4 Stretch 30 seconds;Supine;With overpressure    Other Lumbar Stretch Exercise R/L open book 10x5"    Other Lumbar Stretch Exercise childs pose 3x10"; decreased ROM d/t tightness      Lumbar Exercises: Aerobic   Recumbent Bike L3x7mn; L2x239m      Lumbar Exercises: Machines for Strengthening   Cybex Knee Extension 10 reps 5# B LE    Cybex Knee Flexion 10 reps 15# B LE      Lumbar Exercises: Standing   Heel  Raises 10 reps;2 seconds    Heel Raises Limitations counter support    Functional Squats 10 reps    Functional Squats Limitations counter support      Lumbar Exercises: Seated   Other Seated Lumbar Exercises 3 way green pball rollouts 5x10"                    PT Short Term Goals - 08/07/20 0951      PT SHORT TERM GOAL #1   Title Patient to be independent with initial HEP.    Time 3    Period Weeks    Status Achieved   reports partial compliance d/t LBP   Target Date 08/07/20             PT Long Term Goals - 07/31/20 1012      PT LONG TERM GOAL #1   Title Patient to be independent with advanced HEP.    Time 6    Period Weeks    Status Partially Met   reports partial compliance d/t LBP     PT LONG TERM GOAL #2   Title Patient to demonstrate B hip AROM WFL and without pain limiting.    Time 6    Period Weeks    Status Partially Met   improved in B hip ER, still limited     PT LONG TERM GOAL #3   Title Patient to demonstrate B piriformis flexibility  with mild-moderate tightness remaining.    Time 6    Period Weeks    Status On-going   improved in L hip, B tightness still remaining     PT LONG TERM GOAL #4   Title Patient to report tolerance for sitting without limitation in order to tolerate work duties.    Time 6    Period Weeks    Status Achieved   "been great" no longer having issues     PT LONG TERM GOAL #5   Title Patient to report ability to bend over to pick something off the ground without pain limiting.    Time 6    Period Weeks    Status Partially Met   met for L HS pain, but now LBP is limiting     PT LONG TERM GOAL #6   Title Patient to demonstrate lumbar AROM WFL and without pain limiting.    Time 6    Period Weeks    Status On-going                 Plan - 08/07/20 1015    Clinical Impression Statement Pt reports having continued tightness and pain in her low back going across her B buttocks. She reports compliance with HEP and performs the exercises well. She is still having tightness with trunk rotation and extension. She is also still showing tightness in her B piriformis muscles and hamstrings L greater than R.  Will continue to work on LE and lumbar stretching,mobility, and LE strength to achieve functional goals.    Personal Factors and Comorbidities Age;Comorbidity 3+;Past/Current Experience;Profession;Time since onset of injury/illness/exacerbation    Comorbidities psoriatic arthritis, peripheral neuropathy, HTN, GERD, depression, R TKA, lumbar spine surgery    PT Frequency 1x / week    PT Duration 6 weeks    PT Treatment/Interventions ADLs/Self Care Home Management;Cryotherapy;Electrical Stimulation;Iontophoresis 57m/ml Dexamethasone;Moist Heat;Balance training;Therapeutic exercise;Therapeutic activities;Functional mobility training;Stair training;Gait training;Ultrasound;Neuromuscular re-education;Patient/family education;Manual techniques;Taping;Energy conservation;Dry needling;Passive range of  motion    PT Next Visit Plan lumbar and hip  mobility; modalities PRN    Consulted and Agree with Plan of Care Patient           Patient will benefit from skilled therapeutic intervention in order to improve the following deficits and impairments:  Hypomobility,Decreased activity tolerance,Decreased strength,Pain,Increased fascial restricitons,Difficulty walking,Increased muscle spasms,Improper body mechanics,Decreased range of motion,Impaired flexibility,Postural dysfunction  Visit Diagnosis: Chronic midline low back pain without sciatica  Pain in right hip  Pain in left hip  Stiffness of right hip, not elsewhere classified  Stiffness of left hip, not elsewhere classified  Other muscle spasm  Other symptoms and signs involving the musculoskeletal system     Problem List Patient Active Problem List   Diagnosis Date Noted  . Pulmonary hypertension, unspecified ([place]) 05/25/2019  . Educated about COVID-19 virus infection 05/24/2019  . Obstructive hypertrophic cardiomyopathy ([place]) 05/24/2019  . OAB (overactive bladder) 09/29/2017  . OSA (obstructive sleep apnea) 09/29/2017  . Urge urinary incontinence 09/29/2017  . Fibromyalgia 11/11/2016  . Atrial septal hypertrophy 10/21/2015  . Paresthesia of both feet 07/31/2015  . Bradycardia 08/14/2014  . Obesity 10/09/2013  . Chest pain 10/08/2013  . Factor V Leiden carrier ([place]) 08/26/2011  . Protein S deficiency ([place]) 08/26/2011  . MORBID OBESITY 06/06/2010  . DEPRESSION 06/06/2010  . RESTLESS LEG SYNDROME 06/06/2010  . Essential hypertension 06/06/2010  . ALLERGIC RHINITIS 06/06/2010  . GERD 06/06/2010  . FATTY LIVER DISEASE 06/06/2010  . OSTEOARTHRITIS 06/06/2010  . MYOFASCIAL PAIN SYNDROME 06/06/2010  . EDEMA 06/06/2010  . DYSPNEA 06/06/2010  . CHEST PAIN UNSPECIFIED 06/06/2010    Artist Pais, PTA 08/07/2020, 11:17 AM  [facility] [address]  [place], [place], [address] Phone: [phone]   Fax:  [phone]  Name: [name] [name] MRN: [id number] Date of Birth: 01/26/1956

## 2020-08-14 ENCOUNTER — Ambulatory Visit: Payer: Medicare PPO

## 2020-08-14 ENCOUNTER — Other Ambulatory Visit: Payer: Self-pay

## 2020-08-14 DIAGNOSIS — G8929 Other chronic pain: Secondary | ICD-10-CM | POA: Diagnosis not present

## 2020-08-14 DIAGNOSIS — M62838 Other muscle spasm: Secondary | ICD-10-CM | POA: Diagnosis not present

## 2020-08-14 DIAGNOSIS — M545 Low back pain, unspecified: Secondary | ICD-10-CM | POA: Diagnosis not present

## 2020-08-14 DIAGNOSIS — M25551 Pain in right hip: Secondary | ICD-10-CM

## 2020-08-14 DIAGNOSIS — R29898 Other symptoms and signs involving the musculoskeletal system: Secondary | ICD-10-CM | POA: Diagnosis not present

## 2020-08-14 DIAGNOSIS — M25652 Stiffness of left hip, not elsewhere classified: Secondary | ICD-10-CM

## 2020-08-14 DIAGNOSIS — M25552 Pain in left hip: Secondary | ICD-10-CM | POA: Diagnosis not present

## 2020-08-14 DIAGNOSIS — M25651 Stiffness of right hip, not elsewhere classified: Secondary | ICD-10-CM | POA: Diagnosis not present

## 2020-08-14 NOTE — Therapy (Signed)
[facility] [address]  [place], [place], [address] Phone: [phone]   Fax:  [phone]  Physical Therapy Treatment  Patient Details  Name: [name] [name] MRN: [id number] Date of Birth: [birth year] Referring Provider (PT): Frankey Shown, MD   Encounter Date: 08/14/2020   PT End of Session - 08/14/20 1015    Visit Number 9    Number of Visits 11    Date for PT Re-Evaluation 08/28/20    Authorization Type [employer]    Authorization Time Period 6 visits approved until 08/28/20    Authorization - Visit Number 5    Authorization - Number of Visits 6    PT Start Time 0931    PT Stop Time 1013    PT Time Calculation (min) 42 min    Activity Tolerance Patient tolerated treatment well    Behavior During Therapy [facility] for tasks assessed/performed           Past Medical History:  Diagnosis Date  . Allergic rhinitis   . Arthritis   . Depression   . Factor V Leiden carrier ([place]) 08/26/2011  . Fatty liver   . Fibroids    dysfuction uterine bleeding  . GERD (gastroesophageal reflux disease)   . Hypertension   . Osteoarthritis   . Peripheral neuropathy   . Psoriatic arthritis ([place])   . Restless leg syndrome     Past Surgical History:  Procedure Laterality Date  . CHOLECYSTECTOMY    . KNEE SURGERY     right TKR  . LUMBAR SPINE SURGERY    . TUBAL LIGATION     bilateral  . URETHRAL SLING    . VAGINAL HYSTERECTOMY      There were no vitals filed for this visit.   Subjective Assessment - 08/14/20 0936    Subjective Pt reports arthritis still bothering her all over her hands, shoulders, and hips. She is trying to change medications for this.    Pertinent History psoriatic arthritis, peripheral neuropathy, HTN, GERD, depression, R TKA, lumbar spine surgery    Diagnostic tests none    Currently in Pain? Yes    Pain Score 4     Pain Location Back    Pain Orientation Lower;Right;Left    Pain Descriptors  / Indicators Constant    Pain Type Acute pain                             [facility] Adult PT Treatment/Exercise - 08/14/20 0001      Therapeutic Activites    Therapeutic Activities Lifting    Lifting education on safe lifting to reduce strain on LB. Practiced with 4# weight      Exercises   Exercises Knee/Hip;Lumbar      Lumbar Exercises: Aerobic   Recumbent Bike L3x67mn      Lumbar Exercises: Standing   Other Standing Lumbar Exercises lumbar extension 10x3"      Lumbar Exercises: Seated   Other Seated Lumbar Exercises flexion stretch with green pball 10x5"      Lumbar Exercises: Supine   Bent Knee Raise 10 reps;3 seconds    Bent Knee Raise Limitations red TB and TrA brace    Bridge with clamshell 20 reps;2 seconds    Bridge with BCardinal HealthLimitations red TB    Straight Leg Raise 10 reps;3 seconds    Other Supine Lumbar Exercises DKTC with ball squeeze 2x5 reps  PT Short Term Goals - 08/07/20 0951      PT SHORT TERM GOAL #1   Title Patient to be independent with initial HEP.    Time 3    Period Weeks    Status Achieved   reports partial compliance d/t LBP   Target Date 08/07/20             PT Long Term Goals - 08/14/20 1004      PT LONG TERM GOAL #1   Title Patient to be independent with advanced HEP.    Time 6    Period Weeks    Status Partially Met   reports partial compliance d/t LBP     PT LONG TERM GOAL #2   Title Patient to demonstrate B hip AROM WFL and without pain limiting.    Time 6    Period Weeks    Status Partially Met   improved in B hip ER, still limited     PT LONG TERM GOAL #3   Title Patient to demonstrate B piriformis flexibility with mild-moderate tightness remaining.    Time 6    Period Weeks    Status On-going   improved in L hip, B tightness still remaining     PT LONG TERM GOAL #4   Title Patient to report tolerance for sitting without limitation in order to tolerate work duties.     Time 6    Period Weeks    Status Achieved   "been great" no longer having issues     PT LONG TERM GOAL #5   Title Patient to report ability to bend over to pick something off the ground without pain limiting.    Time 6    Period Weeks    Status Partially Met   1/10 back pain and hip pain when bending down     PT LONG TERM GOAL #6   Title Patient to demonstrate lumbar AROM WFL and without pain limiting.    Time 6    Period Weeks    Status On-going                 Plan - 08/14/20 1016    Clinical Impression Statement Pt is still showing tightness in B piriformis during session today. She reports 1/10 pain in her hamstrings and LB when lifting some thing from the ground, gave her education on lifting objects from the ground safely and to reduce stress on the low back. She needed cues to push her hips back into a squat position and to keep wide BOS when squatting to the ground. Pt did well with ther ex, only reporting difficulty with the bridges and B leg raises. She tolerated the lumbar extensions well in standing but noted continued ROM limitations with that.    Personal Factors and Comorbidities Age;Comorbidity 3+;Past/Current Experience;Profession;Time since onset of injury/illness/exacerbation    Comorbidities psoriatic arthritis, peripheral neuropathy, HTN, GERD, depression, R TKA, lumbar spine surgery    PT Frequency 1x / week    PT Duration 6 weeks    PT Treatment/Interventions ADLs/Self Care Home Management;Cryotherapy;Electrical Stimulation;Iontophoresis 18m/ml Dexamethasone;Moist Heat;Balance training;Therapeutic exercise;Therapeutic activities;Functional mobility training;Stair training;Gait training;Ultrasound;Neuromuscular re-education;Patient/family education;Manual techniques;Taping;Energy conservation;Dry needling;Passive range of motion    PT Next Visit Plan lumbar and hip mobility; modalities PRN    Consulted and Agree with Plan of Care Patient            Patient will benefit from skilled therapeutic intervention in order to improve the following deficits and  impairments:  Hypomobility,Decreased activity tolerance,Decreased strength,Pain,Increased fascial restricitons,Difficulty walking,Increased muscle spasms,Improper body mechanics,Decreased range of motion,Impaired flexibility,Postural dysfunction  Visit Diagnosis: Chronic midline low back pain without sciatica  Pain in right hip  Pain in left hip  Stiffness of right hip, not elsewhere classified  Stiffness of left hip, not elsewhere classified  Other muscle spasm  Other symptoms and signs involving the musculoskeletal system     Problem List Patient Active Problem List   Diagnosis Date Noted  . Pulmonary hypertension, unspecified ([place]) 05/25/2019  . Educated about COVID-19 virus infection 05/24/2019  . Obstructive hypertrophic cardiomyopathy ([place]) 05/24/2019  . OAB (overactive bladder) 09/29/2017  . OSA (obstructive sleep apnea) 09/29/2017  . Urge urinary incontinence 09/29/2017  . Fibromyalgia 11/11/2016  . Atrial septal hypertrophy 10/21/2015  . Paresthesia of both feet 07/31/2015  . Bradycardia 08/14/2014  . Obesity 10/09/2013  . Chest pain 10/08/2013  . Factor V Leiden carrier ([place]) 08/26/2011  . Protein S deficiency ([place]) 08/26/2011  . MORBID OBESITY 06/06/2010  . DEPRESSION 06/06/2010  . RESTLESS LEG SYNDROME 06/06/2010  . Essential hypertension 06/06/2010  . ALLERGIC RHINITIS 06/06/2010  . GERD 06/06/2010  . FATTY LIVER DISEASE 06/06/2010  . OSTEOARTHRITIS 06/06/2010  . MYOFASCIAL PAIN SYNDROME 06/06/2010  . EDEMA 06/06/2010  . DYSPNEA 06/06/2010  . CHEST PAIN UNSPECIFIED 06/06/2010    Artist Pais, PTA 08/14/2020, 10:45 AM  [facility] [address]  [place], [place], [address] Phone: [phone]   Fax:  [phone]  Name: [name] [name] MRN: [id number] Date of Birth:  January 14, 1956

## 2020-08-15 DIAGNOSIS — L601 Onycholysis: Secondary | ICD-10-CM | POA: Diagnosis not present

## 2020-08-21 ENCOUNTER — Ambulatory Visit: Payer: Medicare PPO | Admitting: Physical Therapy

## 2020-08-21 ENCOUNTER — Other Ambulatory Visit: Payer: Self-pay

## 2020-08-21 ENCOUNTER — Encounter: Payer: Self-pay | Admitting: Physical Therapy

## 2020-08-21 DIAGNOSIS — M545 Low back pain, unspecified: Secondary | ICD-10-CM

## 2020-08-21 DIAGNOSIS — M25651 Stiffness of right hip, not elsewhere classified: Secondary | ICD-10-CM | POA: Diagnosis not present

## 2020-08-21 DIAGNOSIS — M25552 Pain in left hip: Secondary | ICD-10-CM

## 2020-08-21 DIAGNOSIS — M62838 Other muscle spasm: Secondary | ICD-10-CM

## 2020-08-21 DIAGNOSIS — G8929 Other chronic pain: Secondary | ICD-10-CM

## 2020-08-21 DIAGNOSIS — M25551 Pain in right hip: Secondary | ICD-10-CM | POA: Diagnosis not present

## 2020-08-21 DIAGNOSIS — R29898 Other symptoms and signs involving the musculoskeletal system: Secondary | ICD-10-CM

## 2020-08-21 DIAGNOSIS — M25652 Stiffness of left hip, not elsewhere classified: Secondary | ICD-10-CM

## 2020-08-21 NOTE — Patient Instructions (Signed)
TENS stands for Transcutaneous Electrical Nerve Stimulation. In other words, electrical impulses are allowed to pass through the skin in order to excite a nerve.   Purpose and Use of TENS:  TENS is a method used to manage acute and chronic pain without the use of drugs. It has been effective in managing pain associated with surgery, sprains, strains, trauma, rheumatoid arthritis, and neuralgias. It is a non-addictive, low risk, and non-invasive technique used to control pain. It is not, by any means, a curative form of treatment.   How TENS Works:  Most TENS units are a Paramedic unit powered by one 9 volt battery. Attached to the outside of the unit are two lead wires where two pins and/or snaps connect on each wire. All units come with a set of four reusable pads or electrodes. These are placed on the skin surrounding the area involved. By inserting the leads into  the pads, the electricity can pass from the unit making the circuit complete.  As the intensity is turned up slowly, the electrical current enters the body from the electrodes through the skin to the surrounding nerve fibers. This triggers the release of hormones from within the body. These hormones contain pain relievers. By increasing the circulation of these hormones, the person's pain may be lessened. It is also believed that the electrical stimulation itself helps to block the pain messages being sent to the brain, thus also decreasing the body's perception of pain.   Hazards:  TENS units are NOT to be used by patients with PACEMAKERS, DEFIBRILLATORS, DIABETIC PUMPS, PREGNANT WOMEN, and patients with SEIZURE DISORDERS.  TENS units are NOT to be used over the heart, throat, brain, or spinal cord.  One of the major side effects from the TENS unit may be skin irritation. Some people may develop a rash if they are sensitive to the materials used in the electrodes or the connecting wires.   Wear the unit for 15 min.   Avoid  overuse due the body getting used to the stem making it not as effective over time.     TENS UNIT  This is helpful for muscle pain and spasm.   Search and Purchase a TENS 7000 2nd edition at www.tenspros.com or www.amazon.com  (It should be less than $30)     TENS unit instructions:   Do not shower or bathe with the unit on  Turn the unit off before removing electrodes or batteries  If the electrodes lose stickiness add a drop of water to the electrodes after they are disconnected from the unit and place on plastic sheet. If you continued to have difficulty, call the TENS unit company to purchase more electrodes.  Do not apply lotion on the skin area prior to use. Make sure the skin is clean and dry as this will help prolong the life of the electrodes.  After use, always check skin for unusual red areas, rash or other skin difficulties. If there are any skin problems, does not apply electrodes to the same area.  Never remove the electrodes from the unit by pulling the wires.  Do not use the TENS unit or electrodes other than as directed.  Do not change electrode placement without consulting your therapist or physician.  Keep 2 fingers with between each electrode.

## 2020-08-21 NOTE — Therapy (Signed)
[facility] [address]  [place], [place], [address] Phone: [phone]   Fax:  [phone]  Physical Therapy Progress Note  Patient Details  Name: [name] [name] MRN: [id number] Date of Birth: Jun 08, 1955 Referring Provider (PT): Frankey Shown, MD   Progress Note Reporting Period 07/24/20 to 08/21/20  See note below for Objective Data and Assessment of Progress/Goals.     Encounter Date: 08/21/2020   PT End of Session - 08/21/20 0924    Visit Number 10    Number of Visits 16    Date for PT Re-Evaluation 10/02/20    Authorization Type [employer]    Authorization Time Period 6 visits approved until 08/28/20    Authorization - Visit Number 6    Authorization - Number of Visits 6    PT Start Time 0849    PT Stop Time 0935    PT Time Calculation (min) 46 min    Activity Tolerance Patient tolerated treatment well    Behavior During Therapy [facility] for tasks assessed/performed           Past Medical History:  Diagnosis Date  . Allergic rhinitis   . Arthritis   . Depression   . Factor V Leiden carrier ([place]) 08/26/2011  . Fatty liver   . Fibroids    dysfuction uterine bleeding  . GERD (gastroesophageal reflux disease)   . Hypertension   . Osteoarthritis   . Peripheral neuropathy   . Psoriatic arthritis ([place])   . Restless leg syndrome     Past Surgical History:  Procedure Laterality Date  . CHOLECYSTECTOMY    . KNEE SURGERY     right TKR  . LUMBAR SPINE SURGERY    . TUBAL LIGATION     bilateral  . URETHRAL SLING    . VAGINAL HYSTERECTOMY      There were no vitals filed for this visit.   Subjective Assessment - 08/21/20 0850    Subjective Doing okay. Had to be put on another Prednisone pack for her arthritis. Back pain has gotten "some better" but still needs some work. Now able to get up without significant back pain. Bending as if when doing laundry still bothers her. Was finally able to plant  her garden.    Pertinent History psoriatic arthritis, peripheral neuropathy, HTN, GERD, depression, R TKA, lumbar spine surgery    Diagnostic tests none    Patient Stated Goals decrease pain    Currently in Pain? Yes    Pain Score 3     Pain Location Back    Pain Orientation Right;Left;Lower    Pain Descriptors / Indicators Constant    Pain Type Acute pain              [facility] PT Assessment - 08/21/20 0001      Assessment   Medical Diagnosis Hamstring tendinitis of L thigh    Referring Provider (PT) Frankey Shown, MD    Onset Date/Surgical Date 04/06/20      AROM   Right Hip Flexion 95    Right Hip External Rotation  24   pain   Right Hip Internal Rotation  28    Left Hip Flexion 95    Left Hip External Rotation  21   pain   Left Hip Internal Rotation  25    Lumbar Flexion ankles   moderate "pulling"   Lumbar Extension mod-severely limited   moderate pain   Lumbar - Right Side Bend  distal thigh   pain   Lumbar - Left Side Bend distal thigh    Lumbar - Right Rotation moderately limited   pain   Lumbar - Left Rotation severely limited   pain     Flexibility   Piriformis R/L moderately tight in fig 4 and KTOS                         [facility] Adult PT Treatment/Exercise - 08/21/20 0001      Lumbar Exercises: Aerobic   Nustep L4 x 6 min (UEs/LEs)      Modalities   Modalities Electrical Stimulation;Cryotherapy      Cryotherapy   Number Minutes Cryotherapy 10 Minutes    Cryotherapy Location Lumbar Spine    Type of Cryotherapy Ice pack      Electrical Stimulation   Electrical Stimulation Location B lumbar paraspinals    Electrical Stimulation Action IFC    Electrical Stimulation Parameters output to tolerance; 10 min    Electrical Stimulation Goals Pain                  PT Education - 08/21/20 [phone]    Education Details edu on benefits, use, wear time, precautions, electrode placement with TENS unit; discussion on objective progress and remaining  impairments    Person(s) Educated Patient    Methods Explanation;Demonstration;Handout    Comprehension Verbalized understanding            PT Short Term Goals - 08/21/20 0855      PT SHORT TERM GOAL #1   Title Patient to be independent with initial HEP.    Time 3    Period Weeks    Status Achieved   reports partial compliance d/t LBP   Target Date 08/07/20             PT Long Term Goals - 08/21/20 0855      PT LONG TERM GOAL #1   Title Patient to be independent with advanced HEP.    Time 6    Period Weeks    Status Partially Met    Target Date 10/02/20      PT LONG TERM GOAL #2   Title Patient to demonstrate B hip AROM WFL and without pain limiting.    Time 6    Period Weeks    Status Partially Met   B hip flexion and R IR AROM has improved, with a decline in B hip ER   Target Date 10/02/20      PT LONG TERM GOAL #3   Title Patient to demonstrate B piriformis flexibility with mild-moderate tightness remaining.    Time 6    Period Weeks    Status On-going   improved, B tightness still remaining   Target Date 10/02/20      PT LONG TERM GOAL #4   Title Patient to report tolerance for sitting without limitation in order to tolerate work duties.    Time 6    Period Weeks    Status Achieved   "been great" no longer having issues     PT LONG TERM GOAL #5   Title Patient to report ability to bend over to pick something off the ground without pain limiting.    Time 6    Period Weeks    Status Partially Met   reporting pain has dropped from 8/10 to 6/10 pain in LB   Target Date 10/02/20      PT  LONG TERM GOAL #6   Title Patient to demonstrate lumbar AROM WFL and without pain limiting.    Time 6    Period Weeks    Status On-going   ROM unchanged   Target Date 10/02/20                 Plan - 08/21/20 4259    Clinical Impression Statement Patient arrived to session with report of being put on a Prednisone pack for her arthritis by her Rheumatologist.  Notes some improvement in LBP since initial eval, as she is now able to get up in the AM without severe pain, however still notes that bending over as if when doing laundry bothers her. Notes that she was also able to start her garden, which she had been avoiding d/t pain. B hip flexion and R IR AROM has improved, with a decline in B hip ER evident today. Lumbar AROM remains unchanged. R piriformis flexibility has improved but tightness still remains B. Patient reports compliance with HEP and notes improvement in perceived flexibility with stretches. D/t limited objective improvement, HEP was not progressed. Trialed e-stim with ice pack to LB and educated patient on personal TENS unit for pain management at home. Patient reported good relief from this modality today. Patient is demonstrating slow improvement towards goals. D/t remaining pain and ROM limitations, would benefit from additional skilled PT services 1x/week for 6 weeks to return to PLOF.    Personal Factors and Comorbidities Age;Comorbidity 3+;Past/Current Experience;Profession;Time since onset of injury/illness/exacerbation    Comorbidities psoriatic arthritis, peripheral neuropathy, HTN, GERD, depression, R TKA, lumbar spine surgery    PT Frequency 1x / week    PT Duration 6 weeks    PT Treatment/Interventions ADLs/Self Care Home Management;Cryotherapy;Electrical Stimulation;Iontophoresis 58m/ml Dexamethasone;Moist Heat;Balance training;Therapeutic exercise;Therapeutic activities;Functional mobility training;Stair training;Gait training;Ultrasound;Neuromuscular re-education;Patient/family education;Manual techniques;Taping;Energy conservation;Dry needling;Passive range of motion    PT Next Visit Plan lumbar and hip mobility; modalities PRN    Consulted and Agree with Plan of Care Patient           Patient will benefit from skilled therapeutic intervention in order to improve the following deficits and impairments:  Hypomobility,Decreased  activity tolerance,Decreased strength,Pain,Increased fascial restricitons,Difficulty walking,Increased muscle spasms,Improper body mechanics,Decreased range of motion,Impaired flexibility,Postural dysfunction  Visit Diagnosis: Chronic midline low back pain without sciatica  Pain in right hip  Pain in left hip  Stiffness of right hip, not elsewhere classified  Stiffness of left hip, not elsewhere classified  Other muscle spasm  Other symptoms and signs involving the musculoskeletal system     Problem List Patient Active Problem List   Diagnosis Date Noted  . Pulmonary hypertension, unspecified ([place] 05/25/2019  . Educated about COVID-19 virus infection 05/24/2019  . Obstructive hypertrophic cardiomyopathy ([place] 05/24/2019  . OAB (overactive bladder) 09/29/2017  . OSA (obstructive sleep apnea) 09/29/2017  . Urge urinary incontinence 09/29/2017  . Fibromyalgia 11/11/2016  . Atrial septal hypertrophy 10/21/2015  . Paresthesia of both feet 07/31/2015  . Bradycardia 08/14/2014  . Obesity 10/09/2013  . Chest pain 10/08/2013  . Factor V Leiden carrier ([place] 08/26/2011  . Protein S deficiency ([place] 08/26/2011  . MORBID OBESITY 06/06/2010  . DEPRESSION 06/06/2010  . RESTLESS LEG SYNDROME 06/06/2010  . Essential hypertension 06/06/2010  . ALLERGIC RHINITIS 06/06/2010  . GERD 06/06/2010  . FATTY LIVER DISEASE 06/06/2010  . OSTEOARTHRITIS 06/06/2010  . MYOFASCIAL PAIN SYNDROME 06/06/2010  . EDEMA 06/06/2010  . DYSPNEA 06/06/2010  . CHEST PAIN UNSPECIFIED 06/06/2010  Janene Harvey, PT, DPT 08/21/20 9:49 AM   [facility] [address]  [place], [place], [address] Phone: [phone]   Fax:  [phone]  Name: [name] [name] MRN: [id number] Date of Birth: 1955/10/02

## 2020-09-04 ENCOUNTER — Ambulatory Visit: Payer: Medicare PPO | Attending: Orthopaedic Surgery

## 2020-09-04 ENCOUNTER — Other Ambulatory Visit: Payer: Self-pay

## 2020-09-04 DIAGNOSIS — M25651 Stiffness of right hip, not elsewhere classified: Secondary | ICD-10-CM

## 2020-09-04 DIAGNOSIS — R29898 Other symptoms and signs involving the musculoskeletal system: Secondary | ICD-10-CM | POA: Insufficient documentation

## 2020-09-04 DIAGNOSIS — G8929 Other chronic pain: Secondary | ICD-10-CM | POA: Diagnosis not present

## 2020-09-04 DIAGNOSIS — M25551 Pain in right hip: Secondary | ICD-10-CM

## 2020-09-04 DIAGNOSIS — M545 Low back pain, unspecified: Secondary | ICD-10-CM | POA: Insufficient documentation

## 2020-09-04 DIAGNOSIS — E538 Deficiency of other specified B group vitamins: Secondary | ICD-10-CM | POA: Diagnosis not present

## 2020-09-04 DIAGNOSIS — R252 Cramp and spasm: Secondary | ICD-10-CM | POA: Diagnosis not present

## 2020-09-04 DIAGNOSIS — M25652 Stiffness of left hip, not elsewhere classified: Secondary | ICD-10-CM | POA: Diagnosis not present

## 2020-09-04 DIAGNOSIS — M25552 Pain in left hip: Secondary | ICD-10-CM | POA: Insufficient documentation

## 2020-09-04 DIAGNOSIS — R5383 Other fatigue: Secondary | ICD-10-CM | POA: Diagnosis not present

## 2020-09-04 DIAGNOSIS — M62838 Other muscle spasm: Secondary | ICD-10-CM | POA: Diagnosis not present

## 2020-09-04 NOTE — Therapy (Signed)
[facility] [address]  [place], [place], [address] Phone: [phone]   Fax:  [phone]  Physical Therapy Treatment  Patient Details  Name: [name] [name] MRN: [id number] Date of Birth: 1956-01-16 Referring Provider (PT): Frankey Shown, MD   Encounter Date: 09/04/2020   PT End of Session - 09/04/20 1103    Visit Number 11    Number of Visits 16    Date for PT Re-Evaluation 10/02/20    Authorization Type [employer]    Authorization Time Period 6 visits approved from 08/28/20-10/02/20    Authorization - Visit Number 1    Authorization - Number of Visits 6    PT Start Time 1017    PT Stop Time 1108    PT Time Calculation (min) 51 min    Activity Tolerance Patient tolerated treatment well    Behavior During Therapy [facility] for tasks assessed/performed           Past Medical History:  Diagnosis Date  . Allergic rhinitis   . Arthritis   . Depression   . Factor V Leiden carrier ([place]) 08/26/2011  . Fatty liver   . Fibroids    dysfuction uterine bleeding  . GERD (gastroesophageal reflux disease)   . Hypertension   . Osteoarthritis   . Peripheral neuropathy   . Psoriatic arthritis ([place])   . Restless leg syndrome     Past Surgical History:  Procedure Laterality Date  . CHOLECYSTECTOMY    . KNEE SURGERY     right TKR  . LUMBAR SPINE SURGERY    . TUBAL LIGATION     bilateral  . URETHRAL SLING    . VAGINAL HYSTERECTOMY      There were no vitals filed for this visit.   Subjective Assessment - 09/04/20 1021    Subjective She reports not doing well with her back and hips. She believes her potassium levels could be low.    Pertinent History psoriatic arthritis, peripheral neuropathy, HTN, GERD, depression, R TKA, lumbar spine surgery    Diagnostic tests none    Patient Stated Goals decrease pain    Currently in Pain? Yes    Pain Score 6     Pain Location Back    Pain Orientation Right;Left;Lower     Pain Descriptors / Indicators Sore    Pain Type Acute pain                             [facility] Adult PT Treatment/Exercise - 09/04/20 0001      Lumbar Exercises: Aerobic   Nustep L5 x 6 min (UEs/LEs)      Knee/Hip Exercises: Standing   Other Standing Knee Exercises DL with yellow medicine ball 10 reps      Knee/Hip Exercises: Seated   Sit to Sand 10 reps;without UE support   yellow medicine ball     Modalities   Modalities Electrical Stimulation;Cryotherapy      Cryotherapy   Number Minutes Cryotherapy 15 Minutes    Cryotherapy Location Lumbar Spine    Type of Cryotherapy Ice pack      Electrical Stimulation   Electrical Stimulation Location B lumbar paraspinals; glutes    Electrical Stimulation Action IFC    Electrical Stimulation Parameters intensity to tolerance    Electrical Stimulation Goals Pain      Manual Therapy   Manual Therapy Soft tissue mobilization;Manual Traction    Soft  tissue mobilization STM to B lumbar paraspinals and QL; glutes    Manual Traction R hip distraction with leg straight                    PT Short Term Goals - 08/21/20 0855      PT SHORT TERM GOAL #1   Title Patient to be independent with initial HEP.    Time 3    Period Weeks    Status Achieved   reports partial compliance d/t LBP   Target Date 08/07/20             PT Long Term Goals - 08/21/20 0855      PT LONG TERM GOAL #1   Title Patient to be independent with advanced HEP.    Time 6    Period Weeks    Status Partially Met    Target Date 10/02/20      PT LONG TERM GOAL #2   Title Patient to demonstrate B hip AROM WFL and without pain limiting.    Time 6    Period Weeks    Status Partially Met   B hip flexion and R IR AROM has improved, with a decline in B hip ER   Target Date 10/02/20      PT LONG TERM GOAL #3   Title Patient to demonstrate B piriformis flexibility with mild-moderate tightness remaining.    Time 6    Period Weeks     Status On-going   improved, B tightness still remaining   Target Date 10/02/20      PT LONG TERM GOAL #4   Title Patient to report tolerance for sitting without limitation in order to tolerate work duties.    Time 6    Period Weeks    Status Achieved   "been great" no longer having issues     PT LONG TERM GOAL #5   Title Patient to report ability to bend over to pick something off the ground without pain limiting.    Time 6    Period Weeks    Status Partially Met   reporting pain has dropped from 8/10 to 6/10 pain in LB   Target Date 10/02/20      PT LONG TERM GOAL #6   Title Patient to demonstrate lumbar AROM WFL and without pain limiting.    Time 6    Period Weeks    Status On-going   ROM unchanged   Target Date 10/02/20                 Plan - 09/04/20 1152    Clinical Impression Statement Pt reports that she has been having constant LBP and believes her potassium levels could be low. Tried STM but she noted increased pain on both sides. However she got relief from the R hip distractions today, stating that it "felt really good". She showed good form with the STS and DL today but noting increased muscle tension in the low back with the DL. She notes that estim helps post session with pain along with ice and is looking into getting a TENS unit for home.    Personal Factors and Comorbidities Age;Comorbidity 3+;Past/Current Experience;Profession;Time since onset of injury/illness/exacerbation    Comorbidities psoriatic arthritis, peripheral neuropathy, HTN, GERD, depression, R TKA, lumbar spine surgery    PT Frequency 1x / week    PT Duration 6 weeks    PT Treatment/Interventions ADLs/Self Care Home Management;Cryotherapy;Electrical Stimulation;Iontophoresis 76m/ml Dexamethasone;Moist Heat;Balance training;Therapeutic  exercise;Therapeutic activities;Functional mobility training;Stair training;Gait training;Ultrasound;Neuromuscular re-education;Patient/family education;Manual  techniques;Taping;Energy conservation;Dry needling;Passive range of motion    PT Next Visit Plan lumbar and hip mobility; modalities PRN    Consulted and Agree with Plan of Care Patient           Patient will benefit from skilled therapeutic intervention in order to improve the following deficits and impairments:  Hypomobility,Decreased activity tolerance,Decreased strength,Pain,Increased fascial restricitons,Difficulty walking,Increased muscle spasms,Improper body mechanics,Decreased range of motion,Impaired flexibility,Postural dysfunction  Visit Diagnosis: Chronic midline low back pain without sciatica  Pain in right hip  Pain in left hip  Stiffness of right hip, not elsewhere classified  Stiffness of left hip, not elsewhere classified  Other muscle spasm  Other symptoms and signs involving the musculoskeletal system     Problem List Patient Active Problem List   Diagnosis Date Noted  . Pulmonary hypertension, unspecified ([place]) 05/25/2019  . Educated about COVID-19 virus infection 05/24/2019  . Obstructive hypertrophic cardiomyopathy ([place]) 05/24/2019  . OAB (overactive bladder) 09/29/2017  . OSA (obstructive sleep apnea) 09/29/2017  . Urge urinary incontinence 09/29/2017  . Fibromyalgia 11/11/2016  . Atrial septal hypertrophy 10/21/2015  . Paresthesia of both feet 07/31/2015  . Bradycardia 08/14/2014  . Obesity 10/09/2013  . Chest pain 10/08/2013  . Factor V Leiden carrier ([place]) 08/26/2011  . Protein S deficiency ([place]) 08/26/2011  . MORBID OBESITY 06/06/2010  . DEPRESSION 06/06/2010  . RESTLESS LEG SYNDROME 06/06/2010  . Essential hypertension 06/06/2010  . ALLERGIC RHINITIS 06/06/2010  . GERD 06/06/2010  . FATTY LIVER DISEASE 06/06/2010  . OSTEOARTHRITIS 06/06/2010  . MYOFASCIAL PAIN SYNDROME 06/06/2010  . EDEMA 06/06/2010  . DYSPNEA 06/06/2010  . CHEST PAIN UNSPECIFIED 06/06/2010    Artist Pais, PTA 09/04/2020, 12:12 PM  [facility] [address]  [place], [place], [address] Phone: [phone]   Fax:  [phone]  Name: ISABEL ARDILA MRN: [id number] Date of Birth: [birth year]

## 2020-09-06 DIAGNOSIS — L97522 Non-pressure chronic ulcer of other part of left foot with fat layer exposed: Secondary | ICD-10-CM | POA: Diagnosis not present

## 2020-09-06 DIAGNOSIS — M2550 Pain in unspecified joint: Secondary | ICD-10-CM | POA: Diagnosis not present

## 2020-09-06 DIAGNOSIS — M65332 Trigger finger, left middle finger: Secondary | ICD-10-CM | POA: Diagnosis not present

## 2020-09-06 DIAGNOSIS — Z79899 Other long term (current) drug therapy: Secondary | ICD-10-CM | POA: Diagnosis not present

## 2020-09-06 DIAGNOSIS — M7918 Myalgia, other site: Secondary | ICD-10-CM | POA: Diagnosis not present

## 2020-09-06 DIAGNOSIS — Z7189 Other specified counseling: Secondary | ICD-10-CM | POA: Diagnosis not present

## 2020-09-06 DIAGNOSIS — L4050 Arthropathic psoriasis, unspecified: Secondary | ICD-10-CM | POA: Diagnosis not present

## 2020-09-06 DIAGNOSIS — M150 Primary generalized (osteo)arthritis: Secondary | ICD-10-CM | POA: Diagnosis not present

## 2020-09-06 DIAGNOSIS — L409 Psoriasis, unspecified: Secondary | ICD-10-CM | POA: Diagnosis not present

## 2020-09-11 ENCOUNTER — Other Ambulatory Visit: Payer: Self-pay

## 2020-09-11 ENCOUNTER — Ambulatory Visit: Payer: Medicare PPO | Admitting: Physical Therapy

## 2020-09-11 ENCOUNTER — Encounter: Payer: Self-pay | Admitting: Physical Therapy

## 2020-09-11 DIAGNOSIS — M62838 Other muscle spasm: Secondary | ICD-10-CM

## 2020-09-11 DIAGNOSIS — M25651 Stiffness of right hip, not elsewhere classified: Secondary | ICD-10-CM

## 2020-09-11 DIAGNOSIS — M25652 Stiffness of left hip, not elsewhere classified: Secondary | ICD-10-CM

## 2020-09-11 DIAGNOSIS — M25552 Pain in left hip: Secondary | ICD-10-CM

## 2020-09-11 DIAGNOSIS — M25551 Pain in right hip: Secondary | ICD-10-CM

## 2020-09-11 DIAGNOSIS — R29898 Other symptoms and signs involving the musculoskeletal system: Secondary | ICD-10-CM

## 2020-09-11 DIAGNOSIS — M545 Low back pain, unspecified: Secondary | ICD-10-CM | POA: Diagnosis not present

## 2020-09-11 DIAGNOSIS — G8929 Other chronic pain: Secondary | ICD-10-CM

## 2020-09-11 NOTE — Therapy (Signed)
[facility] [place], [place], [address] Phone: [phone]   Fax:  [phone]  Physical Therapy Treatment  Patient Details  Name: [name] [name] MRN: [id number] Date of Birth: 05/05/1955 Referring Provider (PT): Frankey Shown, MD   Encounter Date: 09/11/2020   PT End of Session - 09/11/20 1143     Visit Number 12    Number of Visits 16    Date for PT Re-Evaluation 10/02/20    Authorization Type [employer]    Authorization Time Period 6 visits approved from 08/28/20-10/02/20    Authorization - Visit Number 2    Authorization - Number of Visits 6    PT Start Time 1100    PT Stop Time 1140    PT Time Calculation (min) 40 min    Activity Tolerance Patient tolerated treatment well    Behavior During Therapy [facility] for tasks assessed/performed             Past Medical History:  Diagnosis Date   Allergic rhinitis    Arthritis    Depression    Factor V Leiden carrier ([place]) 08/26/2011   Fatty liver    Fibroids    dysfuction uterine bleeding   GERD (gastroesophageal reflux disease)    Hypertension    Osteoarthritis    Peripheral neuropathy    Psoriatic arthritis ([place])    Restless leg syndrome     Past Surgical History:  Procedure Laterality Date   CHOLECYSTECTOMY     KNEE SURGERY     right TKR   LUMBAR SPINE SURGERY     TUBAL LIGATION     bilateral   URETHRAL SLING     VAGINAL HYSTERECTOMY      There were no vitals filed for this visit.   Subjective Assessment - 09/11/20 1102     Subjective Recently saw her Rheumatologist who believes that she has Polymyalgia Rheumatica. Has been put on Prednisone.    Pertinent History psoriatic arthritis, peripheral neuropathy, HTN, GERD, depression, R TKA, lumbar spine surgery    Diagnostic tests none    Patient Stated Goals decrease pain    Currently in Pain? No/denies                               [facility] Adult PT  Treatment/Exercise - 09/11/20 0001       Lumbar Exercises: Stretches   Passive Hamstring Stretch Right;Left;1 rep;30 seconds    Lower Trunk Rotation Limitations x20      Lumbar Exercises: Aerobic   Nustep L5 x 6 min (UEs/LEs)      Lumbar Exercises: Standing   Row Strengthening;Both;10 reps;Theraband    Theraband Level (Row) Level 2 (Red)    Row Limitations cues for scap retraction and depression    Shoulder Extension Strengthening;Both;10 reps;Theraband    Theraband Level (Shoulder Extension) Level 2 (Red)    Shoulder Extension Limitations --   cues for rhtyhmic breathing     Lumbar Exercises: Supine   Pelvic Tilt 10 reps    Pelvic Tilt Limitations cueing for proper movement pattern    Bridge with clamshell 10 reps    Bridge with [employer] Limitations green TB   cues to utilize glutes vs HS d/t cramping; discontinued   Other Supine Lumbar Exercises bent knee falltout alt with red TB 2x10      Knee/Hip Exercises: Stretches   Piriformis Stretch Both;2 reps;30  seconds    Piriformis Stretch Limitations fig 4 supine      Knee/Hip Exercises: Seated   Sit to Sand 10 reps;without UE support   blue medball at chest                   PT Education - 09/11/20 1142     Education Details encouraged patient to continue working on HEP while pain levels are low, but focus on gentle movement and pain management while going through a flare    Person(s) Educated Patient    Methods Explanation    Comprehension Verbalized understanding              PT Short Term Goals - 08/21/20 0855       PT SHORT TERM GOAL #1   Title Patient to be independent with initial HEP.    Time 3    Period Weeks    Status Achieved   reports partial compliance d/t LBP   Target Date 08/07/20               PT Long Term Goals - 08/21/20 0855       PT LONG TERM GOAL #1   Title Patient to be independent with advanced HEP.    Time 6    Period Weeks    Status Partially Met    Target  Date 10/02/20      PT LONG TERM GOAL #2   Title Patient to demonstrate B hip AROM WFL and without pain limiting.    Time 6    Period Weeks    Status Partially Met   B hip flexion and R IR AROM has improved, with a decline in B hip ER   Target Date 10/02/20      PT LONG TERM GOAL #3   Title Patient to demonstrate B piriformis flexibility with mild-moderate tightness remaining.    Time 6    Period Weeks    Status On-going   improved, B tightness still remaining   Target Date 10/02/20      PT LONG TERM GOAL #4   Title Patient to report tolerance for sitting without limitation in order to tolerate work duties.    Time 6    Period Weeks    Status Achieved   "been great" no longer having issues     PT LONG TERM GOAL #5   Title Patient to report ability to bend over to pick something off the ground without pain limiting.    Time 6    Period Weeks    Status Partially Met   reporting pain has dropped from 8/10 to 6/10 pain in LB   Target Date 10/02/20      PT LONG TERM GOAL #6   Title Patient to demonstrate lumbar AROM WFL and without pain limiting.    Time 6    Period Weeks    Status On-going   ROM unchanged   Target Date 10/02/20                   Plan - 09/11/20 1143     Clinical Impression Statement Patient arrived to session with report that she was diagnosed with Polymyalgia Rheumatica. Has been put on Prednisone and is pain-free today. Worked on gentle lumbopelvic and LE stretching with cueing to perform to tolerance. Patient performed bridges with limited ROM and cues to utilize glutes vs HS d/t cramping- discontinued this exercise d/t continued HS cramping. Proceeded with STS transfers with  additional weight held at chest with good transfer technique and tolerance. Finished up session with standing core strengthening with cueing for scapular depression and retraction. Patient tolerated duration of session without c/o pain. Progressing well towards goals.    Personal  Factors and Comorbidities Age;Comorbidity 3+;Past/Current Experience;Profession;Time since onset of injury/illness/exacerbation    Comorbidities psoriatic arthritis, peripheral neuropathy, HTN, GERD, depression, R TKA, lumbar spine surgery    PT Frequency 1x / week    PT Duration 6 weeks    PT Treatment/Interventions ADLs/Self Care Home Management;Cryotherapy;Electrical Stimulation;Iontophoresis 32m/ml Dexamethasone;Moist Heat;Balance training;Therapeutic exercise;Therapeutic activities;Functional mobility training;Stair training;Gait training;Ultrasound;Neuromuscular re-education;Patient/family education;Manual techniques;Taping;Energy conservation;Dry needling;Passive range of motion    PT Next Visit Plan lumbar and hip mobility; modalities PRN    Consulted and Agree with Plan of Care Patient             Patient will benefit from skilled therapeutic intervention in order to improve the following deficits and impairments:  Hypomobility, Decreased activity tolerance, Decreased strength, Pain, Increased fascial restricitons, Difficulty walking, Increased muscle spasms, Improper body mechanics, Decreased range of motion, Impaired flexibility, Postural dysfunction  Visit Diagnosis: Chronic midline low back pain without sciatica  Pain in right hip  Pain in left hip  Stiffness of right hip, not elsewhere classified  Stiffness of left hip, not elsewhere classified  Other muscle spasm  Other symptoms and signs involving the musculoskeletal system     Problem List Patient Active Problem List   Diagnosis Date Noted   Pulmonary hypertension, unspecified ([place] 05/25/2019   Educated about COVID-19 virus infection 05/24/2019   Obstructive hypertrophic cardiomyopathy ([place] 05/24/2019   OAB (overactive bladder) 09/29/2017   OSA (obstructive sleep apnea) 09/29/2017   Urge urinary incontinence 09/29/2017   Fibromyalgia 11/11/2016   Atrial septal hypertrophy 10/21/2015   Paresthesia of  both feet 07/31/2015   Bradycardia 08/14/2014   Obesity 10/09/2013   Chest pain 10/08/2013   Factor V Leiden carrier ([place] 08/26/2011   Protein S deficiency ([place] 08/26/2011   MORBID OBESITY 06/06/2010   DEPRESSION 06/06/2010   RESTLESS LEG SYNDROME 06/06/2010   Essential hypertension 06/06/2010   ALLERGIC RHINITIS 06/06/2010   GERD 06/06/2010   FATTY LIVER DISEASE 06/06/2010   OSTEOARTHRITIS 06/06/2010   MYOFASCIAL PAIN SYNDROME 06/06/2010   EDEMA 06/06/2010   DYSPNEA 06/06/2010   CHEST PAIN UNSPECIFIED 06/06/2010      YJanene Harvey PT, DPT 09/11/20 11:45 AM   [facility] [address] [place] [address]Phone: [phone]  Fax:  [phone] Name: DRINNAH PEPPELMRN: [id number] of Birth: 124-Oct-1957

## 2020-09-18 ENCOUNTER — Other Ambulatory Visit: Payer: Self-pay

## 2020-09-18 ENCOUNTER — Ambulatory Visit: Payer: Medicare PPO | Admitting: Physical Therapy

## 2020-09-18 ENCOUNTER — Encounter: Payer: Self-pay | Admitting: Physical Therapy

## 2020-09-18 DIAGNOSIS — M62838 Other muscle spasm: Secondary | ICD-10-CM | POA: Diagnosis not present

## 2020-09-18 DIAGNOSIS — M545 Low back pain, unspecified: Secondary | ICD-10-CM | POA: Diagnosis not present

## 2020-09-18 DIAGNOSIS — R29898 Other symptoms and signs involving the musculoskeletal system: Secondary | ICD-10-CM

## 2020-09-18 DIAGNOSIS — M25651 Stiffness of right hip, not elsewhere classified: Secondary | ICD-10-CM

## 2020-09-18 DIAGNOSIS — G8929 Other chronic pain: Secondary | ICD-10-CM

## 2020-09-18 DIAGNOSIS — M25552 Pain in left hip: Secondary | ICD-10-CM

## 2020-09-18 DIAGNOSIS — M25551 Pain in right hip: Secondary | ICD-10-CM

## 2020-09-18 DIAGNOSIS — M25652 Stiffness of left hip, not elsewhere classified: Secondary | ICD-10-CM

## 2020-09-18 NOTE — Therapy (Signed)
[facility] [address]  [place], [place], [address] Phone: [phone]   Fax:  [phone]  Physical Therapy Treatment  Patient Details  Name: [name] [name] MRN: [id number] Date of Birth: 1955-04-15 Referring Provider (PT): Frankey Shown, MD   Encounter Date: 09/18/2020   PT End of Session - 09/18/20 1019     Visit Number 13    Number of Visits 16    Date for PT Re-Evaluation 10/02/20    Authorization Type [employer]    Authorization Time Period 6 visits approved from 08/28/20-10/02/20    Authorization - Visit Number 3    Authorization - Number of Visits 6    PT Start Time [phone]    PT Stop Time 1019    PT Time Calculation (min) 42 min    Activity Tolerance Patient tolerated treatment well    Behavior During Therapy [facility] for tasks assessed/performed             Past Medical History:  Diagnosis Date   Allergic rhinitis    Arthritis    Depression    Factor V Leiden carrier ([place]) 08/26/2011   Fatty liver    Fibroids    dysfuction uterine bleeding   GERD (gastroesophageal reflux disease)    Hypertension    Osteoarthritis    Peripheral neuropathy    Psoriatic arthritis ([place])    Restless leg syndrome     Past Surgical History:  Procedure Laterality Date   CHOLECYSTECTOMY     KNEE SURGERY     right TKR   LUMBAR SPINE SURGERY     TUBAL LIGATION     bilateral   URETHRAL SLING     VAGINAL HYSTERECTOMY      There were no vitals filed for this visit.   Subjective Assessment - 09/18/20 0938     Subjective Having more pain in her hips and LB today- not sure what she did. All in all, she can tell a good difference in her back.    Pertinent History psoriatic arthritis, peripheral neuropathy, HTN, GERD, depression, R TKA, lumbar spine surgery    Diagnostic tests none    Patient Stated Goals decrease pain    Currently in Pain? Yes    Pain Score 4     Pain Location Back    Pain Orientation  Right;Left;Lower    Pain Descriptors / Indicators Sore    Pain Type Acute pain    Pain Radiating Towards & B hips                               [facility] Adult PT Treatment/Exercise - 09/18/20 0001       Lumbar Exercises: Stretches   Lower Trunk Rotation Limitations x20      Lumbar Exercises: Aerobic   Nustep L4 x 6 min (UEs/LEs)      Lumbar Exercises: Standing   Row Strengthening;Both;10 reps;Theraband    Theraband Level (Row) Level 2 (Red);Level 3 (Green)    Row Limitations 2x10 cues to contract core    Shoulder Extension Strengthening;Both;10 reps;Theraband    Theraband Level (Shoulder Extension) Level 2 (Red)    Shoulder Extension Limitations 2x10      Lumbar Exercises: Supine   Pelvic Tilt 10 reps    Pelvic Tilt Limitations improved form    Bridge with clamshell 15 reps    Bridge with Ball Squeeze Limitations green TB  limited ROM     Knee/Hip Exercises: Sidelying   Clams red TB x15 each   good form     Manual Therapy   Manual Therapy Soft tissue mobilization;Myofascial release    Manual therapy comments prone    Soft tissue mobilization STM to B lumbar paraspinals and QL, proximal and lateral glutes, piriformis    Myofascial Release manual TPR to B QL, proximal and lateral glutes, piriformis   severe tightness and TTP                     PT Short Term Goals - 08/21/20 0855       PT SHORT TERM GOAL #1   Title Patient to be independent with initial HEP.    Time 3    Period Weeks    Status Achieved   reports partial compliance d/t LBP   Target Date 08/07/20               PT Long Term Goals - 08/21/20 0855       PT LONG TERM GOAL #1   Title Patient to be independent with advanced HEP.    Time 6    Period Weeks    Status Partially Met    Target Date 10/02/20      PT LONG TERM GOAL #2   Title Patient to demonstrate B hip AROM WFL and without pain limiting.    Time 6    Period Weeks    Status Partially Met   B hip  flexion and R IR AROM has improved, with a decline in B hip ER   Target Date 10/02/20      PT LONG TERM GOAL #3   Title Patient to demonstrate B piriformis flexibility with mild-moderate tightness remaining.    Time 6    Period Weeks    Status On-going   improved, B tightness still remaining   Target Date 10/02/20      PT LONG TERM GOAL #4   Title Patient to report tolerance for sitting without limitation in order to tolerate work duties.    Time 6    Period Weeks    Status Achieved   "been great" no longer having issues     PT LONG TERM GOAL #5   Title Patient to report ability to bend over to pick something off the ground without pain limiting.    Time 6    Period Weeks    Status Partially Met   reporting pain has dropped from 8/10 to 6/10 pain in LB   Target Date 10/02/20      PT LONG TERM GOAL #6   Title Patient to demonstrate lumbar AROM WFL and without pain limiting.    Time 6    Period Weeks    Status On-going   ROM unchanged   Target Date 10/02/20                   Plan - 09/18/20 1020     Clinical Impression Statement Patient arrived to session with report of increased LB and B hip pain today without known cause. However, reports that overall she can tell a good improvement in her pain since starting therapy. Worked on progressive hip strengthening today with patient demonstrating good ROM and form with clamshells, however more challenge demonstrating full hip extension/lift with bridges. Increased challenge with resisted core strengthening today with patient demonstrating good carryover of form. Patient noted B glute discomfort with rows/extensions which  was improved with cues to maintain upright posture throughout. Sitting r3est breaks taken in between standing ther-ex today. End of session focused on STM and manual TPR to address significant soft tissue restriction and TTP in B buttocks today. Encouraged patient to continue piriformis stretching to address this.  Patient reported understanding and without complaints at end of session.    Personal Factors and Comorbidities Age;Comorbidity 3+;Past/Current Experience;Profession;Time since onset of injury/illness/exacerbation    Comorbidities psoriatic arthritis, peripheral neuropathy, HTN, GERD, depression, R TKA, lumbar spine surgery    PT Frequency 1x / week    PT Duration 6 weeks    PT Treatment/Interventions ADLs/Self Care Home Management;Cryotherapy;Electrical Stimulation;Iontophoresis 60m/ml Dexamethasone;Moist Heat;Balance training;Therapeutic exercise;Therapeutic activities;Functional mobility training;Stair training;Gait training;Ultrasound;Neuromuscular re-education;Patient/family education;Manual techniques;Taping;Energy conservation;Dry needling;Passive range of motion    PT Next Visit Plan lumbar and hip mobility; modalities PRN    Consulted and Agree with Plan of Care Patient             Patient will benefit from skilled therapeutic intervention in order to improve the following deficits and impairments:  Hypomobility, Decreased activity tolerance, Decreased strength, Pain, Increased fascial restricitons, Difficulty walking, Increased muscle spasms, Improper body mechanics, Decreased range of motion, Impaired flexibility, Postural dysfunction  Visit Diagnosis: Chronic midline low back pain without sciatica  Pain in right hip  Pain in left hip  Stiffness of right hip, not elsewhere classified  Stiffness of left hip, not elsewhere classified  Other muscle spasm  Other symptoms and signs involving the musculoskeletal system     Problem List Patient Active Problem List   Diagnosis Date Noted   Pulmonary hypertension, unspecified ([place] 05/25/2019   Educated about COVID-19 virus infection 05/24/2019   Obstructive hypertrophic cardiomyopathy ([place] 05/24/2019   OAB (overactive bladder) 09/29/2017   OSA (obstructive sleep apnea) 09/29/2017   Urge urinary incontinence 09/29/2017    Fibromyalgia 11/11/2016   Atrial septal hypertrophy 10/21/2015   Paresthesia of both feet 07/31/2015   Bradycardia 08/14/2014   Obesity 10/09/2013   Chest pain 10/08/2013   Factor V Leiden carrier ([place] 08/26/2011   Protein S deficiency ([place] 08/26/2011   MORBID OBESITY 06/06/2010   DEPRESSION 06/06/2010   RESTLESS LEG SYNDROME 06/06/2010   Essential hypertension 06/06/2010   ALLERGIC RHINITIS 06/06/2010   GERD 06/06/2010   FATTY LIVER DISEASE 06/06/2010   OSTEOARTHRITIS 06/06/2010   MYOFASCIAL PAIN SYNDROME 06/06/2010   EDEMA 06/06/2010   DYSPNEA 06/06/2010   CHEST PAIN UNSPECIFIED 06/06/2010    YJanene Harvey PT, DPT 09/18/20 10:24 AM    [facility] [place] [address] [place] [address]Phone: [phone]  Fax:  [phone] Name: DKATILYNN SINKLERMRN: [id number] of Birth: [birth year]

## 2020-09-25 ENCOUNTER — Ambulatory Visit: Payer: Medicare PPO | Admitting: Physical Therapy

## 2020-09-25 ENCOUNTER — Other Ambulatory Visit: Payer: Self-pay

## 2020-09-25 ENCOUNTER — Encounter: Payer: Self-pay | Admitting: Physical Therapy

## 2020-09-25 DIAGNOSIS — G8929 Other chronic pain: Secondary | ICD-10-CM

## 2020-09-25 DIAGNOSIS — M25551 Pain in right hip: Secondary | ICD-10-CM

## 2020-09-25 DIAGNOSIS — M25651 Stiffness of right hip, not elsewhere classified: Secondary | ICD-10-CM

## 2020-09-25 DIAGNOSIS — M62838 Other muscle spasm: Secondary | ICD-10-CM

## 2020-09-25 DIAGNOSIS — R29898 Other symptoms and signs involving the musculoskeletal system: Secondary | ICD-10-CM

## 2020-09-25 DIAGNOSIS — M25552 Pain in left hip: Secondary | ICD-10-CM | POA: Diagnosis not present

## 2020-09-25 DIAGNOSIS — M25652 Stiffness of left hip, not elsewhere classified: Secondary | ICD-10-CM

## 2020-09-25 DIAGNOSIS — N3281 Overactive bladder: Secondary | ICD-10-CM | POA: Diagnosis not present

## 2020-09-25 DIAGNOSIS — M545 Low back pain, unspecified: Secondary | ICD-10-CM | POA: Diagnosis not present

## 2020-09-25 NOTE — Therapy (Signed)
[facility] [address]  [place], [place], [address] Phone: [phone]   Fax:  [phone]  Physical Therapy Treatment  Patient Details  Name: [name] [name] MRN: [id number] Date of Birth: July 12, 1955 Referring Provider (PT): Frankey Shown, MD   Encounter Date: 09/25/2020   PT End of Session - 09/25/20 1352     Visit Number 14    Number of Visits 16    Date for PT Re-Evaluation 10/02/20    Authorization Type [employer]    Authorization Time Period 6 visits approved from 08/28/20-10/02/20    Authorization - Visit Number 4    Authorization - Number of Visits 6    PT Start Time 0354    PT Stop Time 1354    PT Time Calculation (min) 40 min    Activity Tolerance Patient tolerated treatment well    Behavior During Therapy [facility] for tasks assessed/performed             Past Medical History:  Diagnosis Date   Allergic rhinitis    Arthritis    Depression    Factor V Leiden carrier ([place]) 08/26/2011   Fatty liver    Fibroids    dysfuction uterine bleeding   GERD (gastroesophageal reflux disease)    Hypertension    Osteoarthritis    Peripheral neuropathy    Psoriatic arthritis ([place])    Restless leg syndrome     Past Surgical History:  Procedure Laterality Date   CHOLECYSTECTOMY     KNEE SURGERY     right TKR   LUMBAR SPINE SURGERY     TUBAL LIGATION     bilateral   URETHRAL SLING     VAGINAL HYSTERECTOMY      There were no vitals filed for this visit.   Subjective Assessment - 09/25/20 1315     Subjective Doing better than she did last session.    Pertinent History psoriatic arthritis, peripheral neuropathy, HTN, GERD, depression, R TKA, lumbar spine surgery    Diagnostic tests none    Patient Stated Goals decrease pain    Currently in Pain? No/denies                               [facility] Adult PT Treatment/Exercise - 09/25/20 0001       Lumbar Exercises: Stretches    Passive Hamstring Stretch Right;Left;1 rep;30 seconds    Passive Hamstring Stretch Limitations supine with strap    Piriformis Stretch Right;Left;30 seconds;1 rep    Piriformis Stretch Limitations supine    Figure 4 Stretch 1 rep;30 seconds;With overpressure    Figure 4 Stretch Limitations supine with OP    Other Lumbar Stretch Exercise R/L SKTC 30" each      Lumbar Exercises: Aerobic   Nustep L5 x 6 min (UEs/LEs)      Lumbar Exercises: Standing   Other Standing Lumbar Exercises R/L open book stretch at wall 10x each; lumbar extension at all 10x      Knee/Hip Exercises: Standing   Wall Squat 1 set;10 reps    Wall Squat Limitations red TB above knees    Other Standing Knee Exercises R/L 4 way hip with red TB with 2 ski poles 10x each                      PT Short Term Goals - 08/21/20 6568  PT SHORT TERM GOAL #1   Title Patient to be independent with initial HEP.    Time 3    Period Weeks    Status Achieved   reports partial compliance d/t LBP   Target Date 08/07/20               PT Long Term Goals - 08/21/20 0855       PT LONG TERM GOAL #1   Title Patient to be independent with advanced HEP.    Time 6    Period Weeks    Status Partially Met    Target Date 10/02/20      PT LONG TERM GOAL #2   Title Patient to demonstrate B hip AROM WFL and without pain limiting.    Time 6    Period Weeks    Status Partially Met   B hip flexion and R IR AROM has improved, with a decline in B hip ER   Target Date 10/02/20      PT LONG TERM GOAL #3   Title Patient to demonstrate B piriformis flexibility with mild-moderate tightness remaining.    Time 6    Period Weeks    Status On-going   improved, B tightness still remaining   Target Date 10/02/20      PT LONG TERM GOAL #4   Title Patient to report tolerance for sitting without limitation in order to tolerate work duties.    Time 6    Period Weeks    Status Achieved   "been great" no longer having issues      PT LONG TERM GOAL #5   Title Patient to report ability to bend over to pick something off the ground without pain limiting.    Time 6    Period Weeks    Status Partially Met   reporting pain has dropped from 8/10 to 6/10 pain in LB   Target Date 10/02/20      PT LONG TERM GOAL #6   Title Patient to demonstrate lumbar AROM WFL and without pain limiting.    Time 6    Period Weeks    Status On-going   ROM unchanged   Target Date 10/02/20                   Plan - 09/25/20 1354     Clinical Impression Statement Patient arrived with report of feeling better than previous session. Worked on standing thoracolumbar mobility work to encourage improved ROM and standing tolerance. LE strengthening ther-ex was performed with good tolerance and form today. Sitting rest breaks were taken intermittently d/t fatigue. Patient demonstrated excessive anterior trunk lean with 4 way hip d/t limited core stability. Remainder of session was spent working on gentle stretching to allow of cool down. Patient tolerated entirety of session well and was able to demonstrate improved exercise tolerance today. No complaints at end of session.    Personal Factors and Comorbidities Age;Comorbidity 3+;Past/Current Experience;Profession;Time since onset of injury/illness/exacerbation    Comorbidities psoriatic arthritis, peripheral neuropathy, HTN, GERD, depression, R TKA, lumbar spine surgery    PT Frequency 1x / week    PT Duration 6 weeks    PT Treatment/Interventions ADLs/Self Care Home Management;Cryotherapy;Electrical Stimulation;Iontophoresis 62m/ml Dexamethasone;Moist Heat;Balance training;Therapeutic exercise;Therapeutic activities;Functional mobility training;Stair training;Gait training;Ultrasound;Neuromuscular re-education;Patient/family education;Manual techniques;Taping;Energy conservation;Dry needling;Passive range of motion    PT Next Visit Plan lumbar and hip mobility; modalities PRN    Consulted  and Agree with Plan of Care Patient  Patient will benefit from skilled therapeutic intervention in order to improve the following deficits and impairments:  Hypomobility, Decreased activity tolerance, Decreased strength, Pain, Increased fascial restricitons, Difficulty walking, Increased muscle spasms, Improper body mechanics, Decreased range of motion, Impaired flexibility, Postural dysfunction  Visit Diagnosis: Chronic midline low back pain without sciatica  Pain in right hip  Pain in left hip  Stiffness of right hip, not elsewhere classified  Stiffness of left hip, not elsewhere classified  Other muscle spasm  Other symptoms and signs involving the musculoskeletal system     Problem List Patient Active Problem List   Diagnosis Date Noted   Pulmonary hypertension, unspecified ([place]) 05/25/2019   Educated about COVID-19 virus infection 05/24/2019   Obstructive hypertrophic cardiomyopathy ([place]) 05/24/2019   OAB (overactive bladder) 09/29/2017   OSA (obstructive sleep apnea) 09/29/2017   Urge urinary incontinence 09/29/2017   Fibromyalgia 11/11/2016   Atrial septal hypertrophy 10/21/2015   Paresthesia of both feet 07/31/2015   Bradycardia 08/14/2014   Obesity 10/09/2013   Chest pain 10/08/2013   Factor V Leiden carrier ([place]) 08/26/2011   Protein S deficiency ([place]) 08/26/2011   MORBID OBESITY 06/06/2010   DEPRESSION 06/06/2010   RESTLESS LEG SYNDROME 06/06/2010   Essential hypertension 06/06/2010   ALLERGIC RHINITIS 06/06/2010   GERD 06/06/2010   FATTY LIVER DISEASE 06/06/2010   OSTEOARTHRITIS 06/06/2010   MYOFASCIAL PAIN SYNDROME 06/06/2010   EDEMA 06/06/2010   DYSPNEA 06/06/2010   CHEST PAIN UNSPECIFIED 06/06/2010    Janene Harvey, PT, DPT 09/25/20 1:55 PM    [facility] [address]  [place], [place], [address] Phone: [phone]   Fax:  [phone]  Name: MUMTAZ LOVINS MRN: [id number] Date of Birth: January 23, 1956

## 2020-09-26 DIAGNOSIS — M791 Myalgia, unspecified site: Secondary | ICD-10-CM | POA: Diagnosis not present

## 2020-10-02 ENCOUNTER — Other Ambulatory Visit: Payer: Self-pay

## 2020-10-02 ENCOUNTER — Encounter: Payer: Self-pay | Admitting: Physical Therapy

## 2020-10-02 ENCOUNTER — Ambulatory Visit: Payer: Medicare PPO | Attending: Orthopaedic Surgery | Admitting: Physical Therapy

## 2020-10-02 DIAGNOSIS — M62838 Other muscle spasm: Secondary | ICD-10-CM | POA: Diagnosis not present

## 2020-10-02 DIAGNOSIS — M25651 Stiffness of right hip, not elsewhere classified: Secondary | ICD-10-CM | POA: Diagnosis not present

## 2020-10-02 DIAGNOSIS — M25552 Pain in left hip: Secondary | ICD-10-CM | POA: Diagnosis not present

## 2020-10-02 DIAGNOSIS — M25551 Pain in right hip: Secondary | ICD-10-CM | POA: Diagnosis not present

## 2020-10-02 DIAGNOSIS — M545 Low back pain, unspecified: Secondary | ICD-10-CM | POA: Diagnosis not present

## 2020-10-02 DIAGNOSIS — R29898 Other symptoms and signs involving the musculoskeletal system: Secondary | ICD-10-CM | POA: Insufficient documentation

## 2020-10-02 DIAGNOSIS — G8929 Other chronic pain: Secondary | ICD-10-CM | POA: Diagnosis not present

## 2020-10-02 DIAGNOSIS — M25652 Stiffness of left hip, not elsewhere classified: Secondary | ICD-10-CM | POA: Diagnosis not present

## 2020-10-02 NOTE — Therapy (Addendum)
[facility] [address]  [place], [place], [address] Phone: [phone]   Fax:  [phone]  Physical Therapy Treatment  Patient Details  Name: CODIE HAINER MRN: [id number] Date of Birth: 08-Jan-1956 Referring Provider (PT): Frankey Shown, MD   Progress Note Reporting Period 09/04/20 to 10/02/20  See note below for Objective Data and Assessment of Progress/Goals.     Encounter Date: 10/02/2020   PT End of Session - 10/02/20 1007     Visit Number 15    Number of Visits 16    Date for PT Re-Evaluation 10/02/20    Authorization Type [employer]    Authorization Time Period 6 visits approved from 08/28/20-10/02/20    Authorization - Visit Number 5    Authorization - Number of Visits 6    PT Start Time 0928    PT Stop Time 1004    PT Time Calculation (min) 36 min    Activity Tolerance Patient tolerated treatment well    Behavior During Therapy WFL for tasks assessed/performed             Past Medical History:  Diagnosis Date   Allergic rhinitis    Arthritis    Depression    Factor V Leiden carrier ([place]) 08/26/2011   Fatty liver    Fibroids    dysfuction uterine bleeding   GERD (gastroesophageal reflux disease)    Hypertension    Osteoarthritis    Peripheral neuropathy    Psoriatic arthritis ([place])    Restless leg syndrome     Past Surgical History:  Procedure Laterality Date   CHOLECYSTECTOMY     KNEE SURGERY     right TKR   LUMBAR SPINE SURGERY     TUBAL LIGATION     bilateral   URETHRAL SLING     VAGINAL HYSTERECTOMY      There were no vitals filed for this visit.   Subjective Assessment - 10/02/20 0929     Subjective Feels ready to wrap up with PT at this time. Reports 60% improvement in pain levels.    Pertinent History psoriatic arthritis, peripheral neuropathy, HTN, GERD, depression, R TKA, lumbar spine surgery    Diagnostic tests none    Patient Stated Goals decrease pain     Currently in Pain? No/denies                [facility] PT Assessment - 10/02/20 0936       Assessment   Medical Diagnosis Hamstring tendinitis of L thigh    Referring Provider (PT) Frankey Shown, MD    Onset Date/Surgical Date 04/06/20      AROM   Right Hip Flexion 98    Right Hip External Rotation  33    Right Hip Internal Rotation  20    Left Hip Flexion 96    Left Hip External Rotation  28    Left Hip Internal Rotation  29    Lumbar Flexion toes    Lumbar Extension mild-moderately limited    Lumbar - Right Side Bend distal thigh   pain   Lumbar - Left Side Bend distal thigh   pain   Lumbar - Right Rotation mildly limited    Lumbar - Left Rotation moderately limited      Flexibility   Piriformis R mildly tight in fig 4, L moderately tight in fig 4; B WFL in KTOS  [facility] Adult PT Treatment/Exercise - 10/02/20 0001       Lumbar Exercises: Standing   Other Standing Lumbar Exercises R/L paloff press with green TB 10x each   cues to maintain core tight     Lumbar Exercises: Seated   Other Seated Lumbar Exercises pelvic tilts 10x   better tolerance     Lumbar Exercises: Quadruped   [place] 10 reps   cues to maintain elbows straight; limited by knee pain                   PT Education - 10/02/20 1007     Education Details discussion on objective progress and remaining impairments; update/consolidation of HEP    Person(s) Educated Patient    Methods Explanation;Demonstration;Tactile cues;Verbal cues;Handout    Comprehension Verbalized understanding              PT Short Term Goals - 10/02/20 1008       PT SHORT TERM GOAL #1   Title Patient to be independent with initial HEP.    Time 3    Period Weeks    Status Achieved    Target Date 08/07/20               PT Long Term Goals - 10/02/20 1008       PT LONG TERM GOAL #1   Title Patient to be independent with advanced HEP.    Time 6     Period Weeks    Status Achieved      PT LONG TERM GOAL #2   Title Patient to demonstrate B hip AROM WFL and without pain limiting.    Time 6    Period Weeks    Status Partially Met   Hip AROM has improved in B hip flexion and ER and L hip IR     PT LONG TERM GOAL #3   Title Patient to demonstrate B piriformis flexibility with mild-moderate tightness remaining.    Time 6    Period Weeks    Status Partially Met   improved, L hip tightness still remaining     PT LONG TERM GOAL #4   Title Patient to report tolerance for sitting without limitation in order to tolerate work duties.    Time 6    Period Weeks    Status Achieved   "been great" no longer having issues     PT LONG TERM GOAL #5   Title Patient to report ability to bend over to pick something off the ground without pain limiting.    Time 6    Period Weeks    Status Partially Met   able to pick up larger items without trouble, still painful when picking up smaller items     PT LONG TERM GOAL #6   Title Patient to demonstrate lumbar AROM WFL and without pain limiting.    Time 6    Period Weeks    Status Partially Met   Lumbar AROM is now less painful and improved in flexion, extension, and B rotation                  Plan - 10/02/20 1008     Clinical Impression Statement Patient arrived to session with report of feeling ready to wrap up with PT and transition to HEP at this time. Reports 60% improvement in pain levels since starts of POC. Notes that she is now able to pick up larger items that are easier to grab,  but has more difficulty when picking up something smaller from the floor like a penny d/t it requiring more ROM. Hip AROM has improved in B hip flexion and ER and L hip IR. Lumbar AROM is now less painful and improved in flexion, extension, and B rotation. Piriformis flexibility is now normal in KTOS, but with some tightness still remaining in figure 4 stretch L>R. Updated HEP with a variety of exercises per  patient's request. Advised patient to continue with maintenance HEP for max fitness and pain management. Patient reported understanding of edu provided and without complaints at end of session. Patient is independent with HEP, has made good progress towards goals, and is ready for 30 day hold at this time.    Personal Factors and Comorbidities Age;Comorbidity 3+;Past/Current Experience;Profession;Time since onset of injury/illness/exacerbation    Comorbidities psoriatic arthritis, peripheral neuropathy, HTN, GERD, depression, R TKA, lumbar spine surgery    PT Frequency 1x / week    PT Duration 6 weeks    PT Treatment/Interventions ADLs/Self Care Home Management;Cryotherapy;Electrical Stimulation;Iontophoresis 31m/ml Dexamethasone;Moist Heat;Balance training;Therapeutic exercise;Therapeutic activities;Functional mobility training;Stair training;Gait training;Ultrasound;Neuromuscular re-education;Patient/family education;Manual techniques;Taping;Energy conservation;Dry needling;Passive range of motion    PT Next Visit Plan 30 day hold at this time    Consulted and Agree with Plan of Care Patient             Patient will benefit from skilled therapeutic intervention in order to improve the following deficits and impairments:  Hypomobility, Decreased activity tolerance, Decreased strength, Pain, Increased fascial restricitons, Difficulty walking, Increased muscle spasms, Improper body mechanics, Decreased range of motion, Impaired flexibility, Postural dysfunction  Visit Diagnosis: Chronic midline low back pain without sciatica  Pain in right hip  Pain in left hip  Stiffness of right hip, not elsewhere classified  Stiffness of left hip, not elsewhere classified  Other muscle spasm  Other symptoms and signs involving the musculoskeletal system     Problem List Patient Active Problem List   Diagnosis Date Noted   Pulmonary hypertension, unspecified ([place] 05/25/2019   Educated about  COVID-19 virus infection 05/24/2019   Obstructive hypertrophic cardiomyopathy ([place] 05/24/2019   OAB (overactive bladder) 09/29/2017   OSA (obstructive sleep apnea) 09/29/2017   Urge urinary incontinence 09/29/2017   Fibromyalgia 11/11/2016   Atrial septal hypertrophy 10/21/2015   Paresthesia of both feet 07/31/2015   Bradycardia 08/14/2014   Obesity 10/09/2013   Chest pain 10/08/2013   Factor V Leiden carrier ([place] 08/26/2011   Protein S deficiency ([place] 08/26/2011   MORBID OBESITY 06/06/2010   DEPRESSION 06/06/2010   RESTLESS LEG SYNDROME 06/06/2010   Essential hypertension 06/06/2010   ALLERGIC RHINITIS 06/06/2010   GERD 06/06/2010   FATTY LIVER DISEASE 06/06/2010   OSTEOARTHRITIS 06/06/2010   MYOFASCIAL PAIN SYNDROME 06/06/2010   EDEMA 06/06/2010   DYSPNEA 06/06/2010   CHEST PAIN UNSPECIFIED 06/06/2010     YJanene Harvey PT, DPT 10/02/20 10:11 AM    [facility] [address] [place] [address]Phone: [phone]  Fax:  [phone] Name: DJOEANN STEPPEMRN: [id number] of Birth: 116-Jul-1957  PHYSICAL THERAPY DISCHARGE SUMMARY  Visits from Start of Care: 15  Current functional level related to goals / functional outcomes: See above clinical impression; patient did not return since 30 day hold   Remaining deficits: Limited hip ROM and flexibility, decreased lumbar mobility, difficulty picking object from the floor   Education / Equipment: HEP  Plan: Patient agrees to  discharge.  Patient goals were partially met. Patient is being discharged due to being pleased with her CLOF.     Janene Harvey, PT, DPT 11/13/20 3:14 PM

## 2020-10-02 NOTE — Patient Instructions (Signed)
Access Code: [email or web address] URL: [email or web address] Date: 10/02/2020 Prepared by: Grayling Congress  Exercises Sidelying Thoracic Rotation with Open Book - 2 x daily - 7 x weekly - 2 sets - 10 reps Prone Press Up - 2 x daily - 7 x weekly - 2 sets - 10 reps Supine Piriformis Stretch with Foot on Ground - 2 x daily - 7 x weekly - 2 sets - 30 sec hold Supine Figure 4 Piriformis Stretch - 2 x daily - 7 x weekly - 2 sets - 30 sec hold Seated Flexion Stretch with Swiss Ball - 2 x daily - 7 x weekly - 2 sets - 10 reps - 5 sec hold Seated Thoracic Flexion and Rotation with Swiss Ball - 2 x daily - 7 x weekly - 2 sets - 10 reps - 5 sec hold Seated Pelvic Tilt - 2 x daily - 7 x weekly - 2 sets - 10 reps Supine Bridge with Resistance Band - 2 x daily - 7 x weekly - 2 sets - 10 reps Clamshell with Resistance - 2 x daily - 7 x weekly - 2 sets - 10 reps Standing Anti-Rotation Press with Anchored Resistance - 2 x daily - 7 x weekly - 2 sets - 10 reps

## 2020-10-04 DIAGNOSIS — L4050 Arthropathic psoriasis, unspecified: Secondary | ICD-10-CM | POA: Diagnosis not present

## 2020-10-04 DIAGNOSIS — L409 Psoriasis, unspecified: Secondary | ICD-10-CM | POA: Diagnosis not present

## 2020-10-04 DIAGNOSIS — R5382 Chronic fatigue, unspecified: Secondary | ICD-10-CM | POA: Diagnosis not present

## 2020-10-04 DIAGNOSIS — M65332 Trigger finger, left middle finger: Secondary | ICD-10-CM | POA: Diagnosis not present

## 2020-10-04 DIAGNOSIS — M7918 Myalgia, other site: Secondary | ICD-10-CM | POA: Diagnosis not present

## 2020-10-04 DIAGNOSIS — Z79899 Other long term (current) drug therapy: Secondary | ICD-10-CM | POA: Diagnosis not present

## 2020-10-04 DIAGNOSIS — M2550 Pain in unspecified joint: Secondary | ICD-10-CM | POA: Diagnosis not present

## 2020-10-04 DIAGNOSIS — G8929 Other chronic pain: Secondary | ICD-10-CM | POA: Diagnosis not present

## 2020-10-04 DIAGNOSIS — M150 Primary generalized (osteo)arthritis: Secondary | ICD-10-CM | POA: Diagnosis not present

## 2020-10-08 DIAGNOSIS — Z1231 Encounter for screening mammogram for malignant neoplasm of breast: Secondary | ICD-10-CM | POA: Diagnosis not present

## 2020-10-21 ENCOUNTER — Ambulatory Visit: Payer: Medicare PPO | Admitting: Podiatry

## 2020-10-21 ENCOUNTER — Ambulatory Visit (INDEPENDENT_AMBULATORY_CARE_PROVIDER_SITE_OTHER): Payer: Medicare PPO

## 2020-10-21 ENCOUNTER — Other Ambulatory Visit: Payer: Self-pay

## 2020-10-21 DIAGNOSIS — L97512 Non-pressure chronic ulcer of other part of right foot with fat layer exposed: Secondary | ICD-10-CM

## 2020-10-21 DIAGNOSIS — M2041 Other hammer toe(s) (acquired), right foot: Secondary | ICD-10-CM | POA: Diagnosis not present

## 2020-10-21 DIAGNOSIS — M2042 Other hammer toe(s) (acquired), left foot: Secondary | ICD-10-CM | POA: Diagnosis not present

## 2020-10-21 DIAGNOSIS — L989 Disorder of the skin and subcutaneous tissue, unspecified: Secondary | ICD-10-CM

## 2020-10-21 NOTE — Progress Notes (Signed)
Subjective: 65 y.o. female presenting to the office today for evaluation of pain and tenderness associated to the right second toe.  Patient states that the right second toe has been very painful for several months now.  She denies a history of injury.  The pain is associated to a symptomatic callus at the distal tip of the toe.  She develops calluses routinely.  She would like to have her calluses debrided today since they are very symptomatic to the bilateral feet.  She presents for further treatment and evaluation   Past Medical History:  Diagnosis Date   Allergic rhinitis    Arthritis    Depression    Factor V Leiden carrier ([place]) 08/26/2011   Fatty liver    Fibroids    dysfuction uterine bleeding   GERD (gastroesophageal reflux disease)    Hypertension    Osteoarthritis    Peripheral neuropathy    Psoriatic arthritis ([facility])    Restless leg syndrome      Objective:  Physical Exam General: Alert and oriented x3 in no acute distress  Dermatology: Hyperkeratotic lesion(s) present on the distal tip of the right second toe and bilateral feet. Pain on palpation with a central nucleated core noted. Skin is warm, dry and supple bilateral lower extremities.  After debridement of the distal tip callus to the right second toe there is an underlying ulcer.  The ulcer measures approximately 0.2 x 0.2 x 0.1 cm.  To the noted ulceration, there is no exposed bone muscle tendon ligament or joint.  Wound base is granular.  No malodor noted.  No drainage noted.  Vascular: Palpable pedal pulses bilaterally. No edema or erythema noted. Capillary refill within normal limits.  Neurological: Epicritic and protective threshold grossly intact bilaterally.   Musculoskeletal Exam: Pain on palpation at the keratotic lesion(s) noted. Range of motion within normal limits bilateral. Muscle strength 5/5 in all groups bilateral.  Radiographic exam: Normal osseous mineralization.  The distal tip of the right  second toe does not indicate any osteolytic or erosion process that would be concerning for osteomyelitis.  Assessment: 1.  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  Preulcerative callus lesions bilateral 3.  Ulcer distal tuft of the right second toe   Plan of Care:  1. Patient evaluated 2. Excisional debridement of keratoic lesion(s) using a chisel blade was performed without incident.  3.  Medically necessary excisional debridement including subcutaneous tissue was performed to the distal tip ulcer of the right second toe.  Excisional debridement of all the necrotic nonviable tissue down to healthy bleeding viable tissue was performed with postdebridement measurement same as pre-.  Debridement performed using a 312 scalpel.  Dressed area with light dressing. 4.  I do believe it would be in the patient's best interest to perform a flexor tenotomy of the right second toe.  The procedure was explained to the patient in detail.  Post care instructions were also discussed.  The patient drove here by herself today.   5.  Return to clinic in 3 weeks for flexor tenotomy of the right second toe.  Patient states that her sister will drive her to and from her appointment that they  Edrick Kins, DPM [facility]  Dr. Edrick Kins, DPM    [address]  [place], [place] [address]                Office [phone]  Fax [phone]

## 2020-10-21 NOTE — Progress Notes (Signed)
G

## 2020-10-28 DIAGNOSIS — F324 Major depressive disorder, single episode, in partial remission: Secondary | ICD-10-CM | POA: Diagnosis not present

## 2020-10-28 DIAGNOSIS — G2581 Restless legs syndrome: Secondary | ICD-10-CM | POA: Diagnosis not present

## 2020-10-28 DIAGNOSIS — R1011 Right upper quadrant pain: Secondary | ICD-10-CM | POA: Diagnosis not present

## 2020-10-28 DIAGNOSIS — Z1159 Encounter for screening for other viral diseases: Secondary | ICD-10-CM | POA: Diagnosis not present

## 2020-10-28 DIAGNOSIS — F419 Anxiety disorder, unspecified: Secondary | ICD-10-CM | POA: Diagnosis not present

## 2020-10-28 DIAGNOSIS — I10 Essential (primary) hypertension: Secondary | ICD-10-CM | POA: Diagnosis not present

## 2020-10-28 DIAGNOSIS — G629 Polyneuropathy, unspecified: Secondary | ICD-10-CM | POA: Diagnosis not present

## 2020-10-30 ENCOUNTER — Telehealth: Payer: Self-pay | Admitting: Urology

## 2020-10-30 NOTE — Telephone Encounter (Signed)
DOS - 11/11/20  TENOTOMY 2ND RIGHT --- [id number]  [employer] EFFECTIVE DATE - 03/31/19   PER [facility] [employer] S[employer] CPT CODE 10272 NO PRIOR AUTH IS REQUIRED.

## 2020-11-04 DIAGNOSIS — L4059 Other psoriatic arthropathy: Secondary | ICD-10-CM | POA: Diagnosis not present

## 2020-11-04 DIAGNOSIS — R202 Paresthesia of skin: Secondary | ICD-10-CM | POA: Diagnosis not present

## 2020-11-04 DIAGNOSIS — Z79899 Other long term (current) drug therapy: Secondary | ICD-10-CM | POA: Diagnosis not present

## 2020-11-04 DIAGNOSIS — R200 Anesthesia of skin: Secondary | ICD-10-CM | POA: Diagnosis not present

## 2020-11-04 DIAGNOSIS — G629 Polyneuropathy, unspecified: Secondary | ICD-10-CM | POA: Diagnosis not present

## 2020-11-11 ENCOUNTER — Other Ambulatory Visit: Payer: Self-pay

## 2020-11-11 ENCOUNTER — Ambulatory Visit: Payer: Medicare PPO | Admitting: Podiatry

## 2020-11-11 ENCOUNTER — Telehealth: Payer: Self-pay | Admitting: Podiatry

## 2020-11-11 DIAGNOSIS — M2041 Other hammer toe(s) (acquired), right foot: Secondary | ICD-10-CM | POA: Diagnosis not present

## 2020-11-11 DIAGNOSIS — L97512 Non-pressure chronic ulcer of other part of right foot with fat layer exposed: Secondary | ICD-10-CM

## 2020-11-11 NOTE — Telephone Encounter (Signed)
I'd prefer her not to drive at all this week. She can begin driving after I see her at her next appt. - Dr. Amalia Hailey

## 2020-11-11 NOTE — Telephone Encounter (Signed)
Pt called wanting to know if it were okay for her to take her boot off for when she drives and put it back on after, or if you preferred her to leave it on and not drive at all. Please advise.

## 2020-11-11 NOTE — Progress Notes (Signed)
   HPI: 65 y.o. female presenting today for evaluation and for procedural correction of a symptomatic hammertoe to the right second digit which is causing an ulcer to the distal tip of the toe.  Last visit we did discuss flexor tenotomy.  She presents this morning to have her flexor tenotomy of the right second digit performed to help alleviate pressure from the distal tip of the toe.  She presents with her sister this morning.  Past Medical History:  Diagnosis Date   Allergic rhinitis    Arthritis    Depression    Factor V Leiden carrier ([place]) 08/26/2011   Fatty liver    Fibroids    dysfuction uterine bleeding   GERD (gastroesophageal reflux disease)    Hypertension    Osteoarthritis    Peripheral neuropathy    Psoriatic arthritis (HCC)    Restless leg syndrome      Objective: Physical Exam General: The patient is alert and oriented x3 in no acute distress.  Dermatology: Skin is cool, dry and supple bilateral lower extremities. Negative for open lesions or macerations.  Vascular: Palpable pedal pulses bilaterally. No edema or erythema noted. Capillary refill within normal limits.  Neurological: Epicritic and protective threshold grossly intact bilaterally.   Musculoskeletal Exam: All pedal and ankle joints range of motion within normal limits bilateral. Muscle strength 5/5 in all groups bilateral.  Reducible hammertoe contracture deformity noted to the symptomatic toe  Assessment: 1.  Reducible hammertoe right second toe   Plan of Care:  1. Patient evaluated.  2.  Different treatment options were discussed with the patient. 3.  After evaluating the patient I do believe that a flexor tenotomy to the respective digit would help alleviate the patient's symptoms.  This would lift the toe to alleviate pressure from the digit.  The procedure was explained in detail and all patient questions were answered.  No guarantees were expressed or implied.  The patient consented for  correction here in the office 4.  Prior to procedure the toe was blocked in a digital block fashion using 3 mL of lidocaine 2%. 5.  Flexor tenotomy was performed of the respective digit using a surgical #11 scalpel and a small percutaneous stab incision on the plantar sulcus of the toe.  The toe was immediately in a more rectus position.  Betadine soaked dry sterile dressing was applied. 6.  Post care instructions were provided 7.  Surgical shoe dispensed 8.  Return to clinic in 1 week    Edrick Kins, DPM [facility]  Dr. Edrick Kins, DPM    [address], [place] [address]                Office [phone]  Fax [phone]

## 2020-11-18 ENCOUNTER — Other Ambulatory Visit: Payer: Self-pay

## 2020-11-18 ENCOUNTER — Ambulatory Visit (INDEPENDENT_AMBULATORY_CARE_PROVIDER_SITE_OTHER): Payer: Medicare PPO | Admitting: Podiatry

## 2020-11-18 ENCOUNTER — Ambulatory Visit (INDEPENDENT_AMBULATORY_CARE_PROVIDER_SITE_OTHER): Payer: Medicare PPO

## 2020-11-18 DIAGNOSIS — Z9889 Other specified postprocedural states: Secondary | ICD-10-CM | POA: Diagnosis not present

## 2020-11-18 DIAGNOSIS — L97512 Non-pressure chronic ulcer of other part of right foot with fat layer exposed: Secondary | ICD-10-CM

## 2020-11-18 NOTE — Progress Notes (Signed)
   Subjective:  Patient presents today status post flexor tenotomy of the right second toe. DOS: 11/11/2020.  Patient states she is doing well.  No new complaints at this time.  She is kept the dressings clean dry and intact and has been wearing the surgical shoe as instructed.  Past Medical History:  Diagnosis Date   Allergic rhinitis    Arthritis    Depression    Factor V Leiden carrier ([place]) 08/26/2011   Fatty liver    Fibroids    dysfuction uterine bleeding   GERD (gastroesophageal reflux disease)    Hypertension    Osteoarthritis    Peripheral neuropathy    Psoriatic arthritis (HCC)    Restless leg syndrome       Objective/Physical Exam Neurovascular status intact.  The small percutaneous incision to the plantar aspect of the toe has healed.  No drainage.  The toes in a more elevated rectus position.  Pressure is alleviated from the distal tip of the toe with weightbearing.  Radiographic Exam:  Orthopedic hardware and osteotomies sites appear to be stable with routine healing.  Assessment: 1. s/p flexor tenotomy right second toe. DOS: 11/11/2020   Plan of Care:  1. Patient was evaluated. X-rays reviewed 2.  Patient may resume full activity no restrictions. 3.  Recommend good supportive shoes and socks 4.  Return to clinic in 3 months   Edrick Kins, DPM [facility]  Dr. Edrick Kins, DPM    [address], [place] [address]                Office [phone]  Fax [phone]

## 2020-11-21 DIAGNOSIS — Z1211 Encounter for screening for malignant neoplasm of colon: Secondary | ICD-10-CM | POA: Diagnosis not present

## 2020-11-21 DIAGNOSIS — K648 Other hemorrhoids: Secondary | ICD-10-CM | POA: Diagnosis not present

## 2020-11-21 DIAGNOSIS — D122 Benign neoplasm of ascending colon: Secondary | ICD-10-CM | POA: Diagnosis not present

## 2020-11-25 ENCOUNTER — Encounter: Payer: Medicare PPO | Admitting: Podiatry

## 2020-11-25 DIAGNOSIS — M7918 Myalgia, other site: Secondary | ICD-10-CM | POA: Diagnosis not present

## 2020-11-25 DIAGNOSIS — D122 Benign neoplasm of ascending colon: Secondary | ICD-10-CM | POA: Diagnosis not present

## 2020-11-25 DIAGNOSIS — L409 Psoriasis, unspecified: Secondary | ICD-10-CM | POA: Diagnosis not present

## 2020-11-25 DIAGNOSIS — L4050 Arthropathic psoriasis, unspecified: Secondary | ICD-10-CM | POA: Diagnosis not present

## 2020-11-25 DIAGNOSIS — R5382 Chronic fatigue, unspecified: Secondary | ICD-10-CM | POA: Diagnosis not present

## 2020-11-25 DIAGNOSIS — Z79899 Other long term (current) drug therapy: Secondary | ICD-10-CM | POA: Diagnosis not present

## 2020-11-25 DIAGNOSIS — G8929 Other chronic pain: Secondary | ICD-10-CM | POA: Diagnosis not present

## 2020-11-25 DIAGNOSIS — M2550 Pain in unspecified joint: Secondary | ICD-10-CM | POA: Diagnosis not present

## 2020-11-25 DIAGNOSIS — M150 Primary generalized (osteo)arthritis: Secondary | ICD-10-CM | POA: Diagnosis not present

## 2020-11-25 DIAGNOSIS — M65332 Trigger finger, left middle finger: Secondary | ICD-10-CM | POA: Diagnosis not present

## 2020-11-27 ENCOUNTER — Other Ambulatory Visit: Payer: Self-pay | Admitting: Podiatry

## 2020-11-27 DIAGNOSIS — Z9889 Other specified postprocedural states: Secondary | ICD-10-CM

## 2020-12-08 DIAGNOSIS — J309 Allergic rhinitis, unspecified: Secondary | ICD-10-CM | POA: Diagnosis not present

## 2020-12-08 DIAGNOSIS — I10 Essential (primary) hypertension: Secondary | ICD-10-CM | POA: Diagnosis not present

## 2020-12-08 DIAGNOSIS — G2581 Restless legs syndrome: Secondary | ICD-10-CM | POA: Diagnosis not present

## 2020-12-08 DIAGNOSIS — F3341 Major depressive disorder, recurrent, in partial remission: Secondary | ICD-10-CM | POA: Diagnosis not present

## 2020-12-08 DIAGNOSIS — G629 Polyneuropathy, unspecified: Secondary | ICD-10-CM | POA: Diagnosis not present

## 2020-12-08 DIAGNOSIS — Z6841 Body Mass Index (BMI) 40.0 and over, adult: Secondary | ICD-10-CM | POA: Diagnosis not present

## 2020-12-08 DIAGNOSIS — K219 Gastro-esophageal reflux disease without esophagitis: Secondary | ICD-10-CM | POA: Diagnosis not present

## 2020-12-08 DIAGNOSIS — F419 Anxiety disorder, unspecified: Secondary | ICD-10-CM | POA: Diagnosis not present

## 2020-12-31 ENCOUNTER — Ambulatory Visit: Payer: Medicare PPO | Admitting: Podiatry

## 2020-12-31 ENCOUNTER — Other Ambulatory Visit: Payer: Self-pay | Admitting: Podiatry

## 2020-12-31 ENCOUNTER — Ambulatory Visit (INDEPENDENT_AMBULATORY_CARE_PROVIDER_SITE_OTHER): Payer: Medicare PPO

## 2020-12-31 ENCOUNTER — Other Ambulatory Visit: Payer: Self-pay

## 2020-12-31 DIAGNOSIS — E08621 Diabetes mellitus due to underlying condition with foot ulcer: Secondary | ICD-10-CM

## 2020-12-31 DIAGNOSIS — L97512 Non-pressure chronic ulcer of other part of right foot with fat layer exposed: Secondary | ICD-10-CM

## 2020-12-31 MED ORDER — DOXYCYCLINE HYCLATE 100 MG PO TABS: 100.0000 mg | ORAL_TABLET | Freq: Two times a day (BID) | ORAL | 0 refills | Status: DC

## 2020-12-31 NOTE — Addendum Note (Signed)
Addended by: Lind Guest on: 12/31/2020 04:25 PM   Modules accepted: Orders

## 2020-12-31 NOTE — Progress Notes (Signed)
   Subjective:  65 y.o. female with PMHx of diabetes mellitus presenting today to the office for evaluation of red swollen toe to the third digit right foot.  Patient states that she was stepped on at a volleyball game.  She says the toe was very red and swollen.  She has not done anything for treatment.  She presents for further treatment and evaluation   Past Medical History:  Diagnosis Date   Allergic rhinitis    Arthritis    Depression    Factor V Leiden carrier ([place]) 08/26/2011   Fatty liver    Fibroids    dysfuction uterine bleeding   GERD (gastroesophageal reflux disease)    Hypertension    Osteoarthritis    Peripheral neuropathy    Psoriatic arthritis (HCC)    Restless leg syndrome        Objective/Physical Exam General: The patient is alert and oriented x3 in no acute distress.  Dermatology:  Wound #1 noted to the plantar aspect of the right third toe measuring 0.7 x 0.7 x 0.3 cm (LxWxD).   To the noted ulceration(s), there is no eschar. There is a moderate amount of slough, fibrin, and necrotic tissue noted. Granulation tissue and wound base is red. There is a minimal amount of serosanguineous drainage noted. There is no exposed bone muscle-tendon ligament or joint. There is no malodor. Periwound integrity is intact. Skin is warm, dry and supple bilateral lower extremities.  Vascular: Palpable pedal pulses bilaterally. No edema or erythema noted. Capillary refill within normal limits.  Neurological: Epicritic and protective threshold diminished bilaterally.   Musculoskeletal Exam: Hammertoe deformities noted to the lesser digits of the right foot contributing to the distal tuft ulcer  Radiographic exam: No clinical evidence of osseous erosion.  Hammertoe contracture deformity noted to the lesser digits.  No fractures identified.  No evidence of osteomyelitis  Assessment: 1.  Ulcer right third digit secondary to diabetes mellitus 2. diabetes mellitus w/ peripheral  neuropathy   Plan of Care:  1. Patient was evaluated. 2. medically necessary excisional debridement including subcutaneous tissue was performed using a tissue nipper and a chisel blade. Excisional debridement of all the necrotic nonviable tissue down to healthy bleeding viable tissue was performed with post-debridement measurements same as pre-. 3. the wound was cleansed and dry sterile dressing applied. 4.  Recommend Betadine ointment daily.  Betadine ointment provided 5.  Cultures taken and sent to pathology for culture and sensitivity 6.  Prescription for doxycycline 100 mg 2 times daily #20 7.  Offloading felt padding was provided to the insoles of the patient's shoes to offload pressure from the toe 8.  Return to clinic in 3 weeks  Edrick Kins, DPM [facility]  Dr. Edrick Kins, [address]                                        [place], [place] [address]                Office [phone]  Fax [phone]

## 2021-01-07 LAB — WOUND CULTURE: Organism ID, Bacteria: NONE SEEN

## 2021-01-14 ENCOUNTER — Ambulatory Visit: Payer: Medicare PPO | Admitting: Podiatry

## 2021-01-14 ENCOUNTER — Other Ambulatory Visit: Payer: Self-pay

## 2021-01-14 DIAGNOSIS — L97512 Non-pressure chronic ulcer of other part of right foot with fat layer exposed: Secondary | ICD-10-CM

## 2021-01-14 DIAGNOSIS — M2041 Other hammer toe(s) (acquired), right foot: Secondary | ICD-10-CM

## 2021-01-14 DIAGNOSIS — E08621 Diabetes mellitus due to underlying condition with foot ulcer: Secondary | ICD-10-CM

## 2021-01-14 MED ORDER — DOXYCYCLINE HYCLATE 100 MG PO TABS: 100.0000 mg | ORAL_TABLET | Freq: Two times a day (BID) | ORAL | 0 refills | Status: DC

## 2021-01-14 NOTE — Progress Notes (Signed)
   HPI: 65 y.o. female PMHx diabetes mellitus with an ulcer to the distal tip of the right third toe presenting for follow-up.  Patient states that she believes she is doing well.  She took the oral doxycycline as prescribed.  She is also been applying the Betadine ointment.  She presents for further treatment and evaluation Past Medical History:  Diagnosis Date   Allergic rhinitis    Arthritis    Depression    Factor V Leiden carrier ([place]) 08/26/2011   Fatty liver    Fibroids    dysfuction uterine bleeding   GERD (gastroesophageal reflux disease)    Hypertension    Osteoarthritis    Peripheral neuropathy    Psoriatic arthritis (HCC)    Restless leg syndrome      Objective: Physical Exam General: The patient is alert and oriented x3 in no acute distress.  Dermatology: Skin is cool, dry and supple bilateral lower extremities. Negative for open lesions or macerations.  Ulcer continues to be noted to the distal tip of the right third toe extending into the level of subcutaneous tissue.  The wound measures approximately 0.5 x 0.5 x 0.2 cm.  There is no exposed bone muscle tendon ligament or joint although there is concerned because there is not much soft tissue between the bone and the skin in this area.  Vascular: Palpable pedal pulses bilaterally. No edema or erythema noted. Capillary refill within normal limits.  Neurological: Epicritic and protective threshold grossly intact bilaterally.   Musculoskeletal Exam: All pedal and ankle joints range of motion within normal limits bilateral. Muscle strength 5/5 in all groups bilateral.  Reducible hammertoe contracture deformity noted to the symptomatic toe  Assessment: 1.  Reducible hammertoe right third toe 2.  Ulcer distal tip of the right third toe secondary to diabetes mellitus   Plan of Care:  1. Patient evaluated.  2.  Different treatment options were discussed with the patient. 3.  After evaluating the patient I do believe that  a flexor tenotomy to the respective digit would help alleviate the patient's symptoms.  This would lift the toe to alleviate pressure from the digit.  The procedure was explained in detail and all patient questions were answered.  No guarantees were expressed or implied.  The patient consented for correction here in the office 4.  Prior to procedure the toe was blocked in a digital block fashion using 3 mL of lidocaine 2%. 5.  Flexor tenotomy was performed of the respective digit using a surgical #11 scalpel and a small percutaneous stab incision on the plantar sulcus of the toe.  The toe was immediately in a more rectus position.  Betadine soaked dry sterile dressing was applied. 6.  Post care instructions were provided 7.  Short cam boot dispensed  8.  Refill prescription for doxycycline 100 mg 2 times daily #20  9.  Return to clinic in 1 week    Edrick Kins, DPM [facility]  Dr. Edrick Kins, DPM    [address], [place] [address]                Office [phone]  Fax [phone]

## 2021-01-28 ENCOUNTER — Ambulatory Visit: Payer: Medicare PPO | Admitting: Podiatry

## 2021-01-28 ENCOUNTER — Other Ambulatory Visit: Payer: Self-pay

## 2021-01-28 DIAGNOSIS — Z9889 Other specified postprocedural states: Secondary | ICD-10-CM

## 2021-01-28 NOTE — Progress Notes (Signed)
   Subjective:  Patient presents today status post flexor tenotomy of the right third digit. DOS: 01/14/2021.  Patient states that she feels much better.  She says that she no longer has any pain associated to the foot.  She is very satisfied and happy and also states that the ulcer to the distal tip of the toe is healed significantly.  Past Medical History:  Diagnosis Date   Allergic rhinitis    Arthritis    Depression    Factor V Leiden carrier ([place]) 08/26/2011   Fatty liver    Fibroids    dysfuction uterine bleeding   GERD (gastroesophageal reflux disease)    Hypertension    Osteoarthritis    Peripheral neuropathy    Psoriatic arthritis (HCC)    Restless leg syndrome       Objective/Physical Exam Neurovascular status intact.  Skin incisions healed.  The third toe of the right foot is in a more rectus position and there is no pressure with weightbearing to the distal tip of the toe.  The wound to the distal tip of the toe has healed.  Complete reepithelialization has occurred.   Assessment: 1. s/p flexor tenotomy right third toe. DOS: 01/14/2021   Plan of Care:  1. Patient was evaluated. 2.  Patient may now resume full activity no restrictions.  Recommend good supportive shoes and sneakers 3.  Return to clinic in 3 months for routine foot care   Edrick Kins, DPM [facility]  Dr. Edrick Kins, DPM    [address], [place] [address]                Office [phone]  Fax [phone]

## 2021-02-10 ENCOUNTER — Other Ambulatory Visit: Payer: Self-pay

## 2021-02-10 ENCOUNTER — Ambulatory Visit
Admission: RE | Admit: 2021-02-10 | Discharge: 2021-02-10 | Disposition: A | Discharge status: Home / Self Care | Payer: Medicare PPO | Source: Ambulatory Visit | Attending: Family Medicine | Admitting: Family Medicine

## 2021-02-10 ENCOUNTER — Other Ambulatory Visit: Payer: Self-pay | Admitting: Family Medicine

## 2021-02-10 DIAGNOSIS — F324 Major depressive disorder, single episode, in partial remission: Secondary | ICD-10-CM | POA: Diagnosis not present

## 2021-02-10 DIAGNOSIS — E538 Deficiency of other specified B group vitamins: Secondary | ICD-10-CM | POA: Diagnosis not present

## 2021-02-10 DIAGNOSIS — R5382 Chronic fatigue, unspecified: Secondary | ICD-10-CM | POA: Diagnosis not present

## 2021-02-10 DIAGNOSIS — R0609 Other forms of dyspnea: Secondary | ICD-10-CM | POA: Diagnosis not present

## 2021-02-10 DIAGNOSIS — L4050 Arthropathic psoriasis, unspecified: Secondary | ICD-10-CM | POA: Diagnosis not present

## 2021-02-17 ENCOUNTER — Ambulatory Visit: Payer: Medicare PPO | Admitting: Podiatry

## 2021-02-18 ENCOUNTER — Emergency Department (HOSPITAL_BASED_OUTPATIENT_CLINIC_OR_DEPARTMENT_OTHER): Payer: Medicare PPO

## 2021-02-18 ENCOUNTER — Encounter (HOSPITAL_BASED_OUTPATIENT_CLINIC_OR_DEPARTMENT_OTHER): Payer: Self-pay

## 2021-02-18 ENCOUNTER — Emergency Department (HOSPITAL_BASED_OUTPATIENT_CLINIC_OR_DEPARTMENT_OTHER)
Admission: EM | Admit: 2021-02-18 | Discharge: 2021-02-18 | Disposition: A | Discharge status: Home / Self Care | Payer: Medicare PPO | Attending: Emergency Medicine | Admitting: Emergency Medicine

## 2021-02-18 ENCOUNTER — Other Ambulatory Visit: Payer: Self-pay

## 2021-02-18 DIAGNOSIS — I10 Essential (primary) hypertension: Secondary | ICD-10-CM | POA: Insufficient documentation

## 2021-02-18 DIAGNOSIS — R1031 Right lower quadrant pain: Secondary | ICD-10-CM | POA: Insufficient documentation

## 2021-02-18 DIAGNOSIS — Z79899 Other long term (current) drug therapy: Secondary | ICD-10-CM | POA: Insufficient documentation

## 2021-02-18 DIAGNOSIS — Z96651 Presence of right artificial knee joint: Secondary | ICD-10-CM | POA: Diagnosis not present

## 2021-02-18 LAB — URINALYSIS, ROUTINE W REFLEX MICROSCOPIC
Bilirubin Urine: NEGATIVE
Glucose, UA: NEGATIVE mg/dL
Hgb urine dipstick: NEGATIVE
Ketones, ur: NEGATIVE mg/dL
Nitrite: NEGATIVE
Protein, ur: 30 mg/dL — AB
Specific Gravity, Urine: >1.046 — ABNORMAL HIGH (ref 1.005–1.030)
pH: 6 (ref 5.0–8.0)

## 2021-02-18 LAB — COMPREHENSIVE METABOLIC PANEL
ALT: 12 U/L (ref 0–44)
AST: 15 U/L (ref 15–41)
Albumin: 4 g/dL (ref 3.5–5.0)
Alkaline Phosphatase: 95 U/L (ref 38–126)
Anion gap: 10 (ref 5–15)
BUN: 18 mg/dL (ref 8–23)
CO2: 27 mmol/L (ref 22–32)
Calcium: 9.6 mg/dL (ref 8.9–10.3)
Chloride: 105 mmol/L (ref 98–111)
Creatinine, Ser: 1.08 mg/dL — ABNORMAL HIGH (ref 0.44–1.00)
GFR, Estimated: 56 mL/min — ABNORMAL LOW (ref >60)
Glucose, Bld: 144 mg/dL — ABNORMAL HIGH (ref 70–99)
Potassium: 3.5 mmol/L (ref 3.5–5.1)
Sodium: 142 mmol/L (ref 135–145)
Total Bilirubin: 0.5 mg/dL (ref 0.3–1.2)
Total Protein: 6.7 g/dL (ref 6.5–8.1)

## 2021-02-18 LAB — CBC WITH DIFFERENTIAL/PLATELET
Abs Immature Granulocytes: 0.02 10*3/uL (ref 0.00–0.07)
Basophils Absolute: 0.1 10*3/uL (ref 0.0–0.1)
Basophils Relative: 1 %
Eosinophils Absolute: 0.1 10*3/uL (ref 0.0–0.5)
Eosinophils Relative: 1 %
HCT: 40.5 % (ref 36.0–46.0)
Hemoglobin: 12.9 g/dL (ref 12.0–15.0)
Immature Granulocytes: 0 %
Lymphocytes Relative: 28 %
Lymphs Abs: 2 10*3/uL (ref 0.7–4.0)
MCH: 27 pg (ref 26.0–34.0)
MCHC: 31.9 g/dL (ref 30.0–36.0)
MCV: 84.7 fL (ref 80.0–100.0)
Monocytes Absolute: 0.5 10*3/uL (ref 0.1–1.0)
Monocytes Relative: 7 %
Neutro Abs: 4.6 10*3/uL (ref 1.7–7.7)
Neutrophils Relative %: 63 %
Platelets: 215 10*3/uL (ref 150–400)
RBC: 4.78 MIL/uL (ref 3.87–5.11)
RDW: 15.8 % — ABNORMAL HIGH (ref 11.5–15.5)
WBC: 7.2 10*3/uL (ref 4.0–10.5)
nRBC: 0 % (ref 0.0–0.2)

## 2021-02-18 LAB — LIPASE, BLOOD: Lipase: 11 U/L (ref 11–51)

## 2021-02-18 MED ORDER — IOHEXOL 300 MG/ML  SOLN
100.0000 mL | Freq: Once | INTRAMUSCULAR | Status: AC | PRN
  Administered 2021-02-18: 100 mL via INTRAVENOUS

## 2021-02-18 MED ORDER — ROPINIROLE HCL 1 MG PO TABS: 2.0000 mg | ORAL_TABLET | Freq: Once | ORAL | Status: DC

## 2021-02-18 NOTE — ED Triage Notes (Signed)
Pt states she is having pain on right side RLQ of abdomen. Pt states pain is 7/10. States standing helps pain and lying down makes it worse. Pt. States pain has been radiating down right leg. Pt. States she is taking tylenol and advil for pain. Pt. States pain has been going on for 3 months comes and goes. Denies nausea and vomiting, no fever.

## 2021-02-18 NOTE — ED Provider Notes (Signed)
[facility] EMERGENCY DEPT Provider Note   CSN: [id number] Arrival date & time: 02/18/21  1958     History Chief Complaint  Patient presents with   Abdominal Pain     [name] [name] is a 65 y.o. female.  The history is provided by the patient.  Abdominal Pain Pain location:  RLQ Pain quality: aching   Pain radiates to:  Does not radiate Pain severity:  Mild Onset quality:  Gradual Timing:  Intermittent Progression:  Waxing and waning Chronicity:  Recurrent Context: previous surgery   Relieved by:  Nothing Worsened by:  Nothing Associated symptoms: no anorexia, no belching, no chest pain, no chills, no constipation, no cough, no diarrhea, no dysuria, no fatigue, no fever, no hematemesis, no hematochezia, no hematuria, no nausea, no shortness of breath, no sore throat, no vaginal bleeding and no vomiting   Risk factors: multiple surgeries       Past Medical History:  Diagnosis Date   Allergic rhinitis    Arthritis    Depression    Factor V Leiden carrier ([place]) 08/26/2011   Fatty liver    Fibroids    dysfuction uterine bleeding   GERD (gastroesophageal reflux disease)    Hypertension    Osteoarthritis    Peripheral neuropathy    Psoriatic arthritis ([place])    Restless leg syndrome     Patient Active Problem List   Diagnosis Date Noted   Pulmonary hypertension, unspecified ([place]) 05/25/2019   Educated about COVID-19 virus infection 05/24/2019   Obstructive hypertrophic cardiomyopathy ([place]) 05/24/2019   OAB (overactive bladder) 09/29/2017   OSA (obstructive sleep apnea) 09/29/2017   Urge urinary incontinence 09/29/2017   Fibromyalgia 11/11/2016   Atrial septal hypertrophy 10/21/2015   Paresthesia of both feet 07/31/2015   Bradycardia 08/14/2014   Obesity 10/09/2013   Chest pain 10/08/2013   Factor V Leiden carrier ([place]) 08/26/2011   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     Family History  Problem Relation Age of Onset   Coronary artery disease Father 70       deceased- TIAs   Stroke Father    Hypertension Mother 83       alive   Deep vein thrombosis Mother    Neuropathy Mother    Osteoporosis Mother    Liver disease Sister    Other Sister        ITP   Cancer Sister        breast   Heart failure Maternal Grandmother     Social History   Tobacco Use   Smoking status: Never   Smokeless tobacco: Never  Vaping Use   Vaping Use: Never used  Substance Use Topics   Alcohol use: No    Alcohol/week: 0.0 standard drinks  Drug use: No    Home Medications Prior to Admission medications   Medication Sig Start Date End Date Taking? Authorizing Provider  acetaminophen (TYLENOL) 325 MG tablet Take 650 mg by mouth every 6 (six) hours as needed for mild pain.   Yes [provider]  ALPRAZolam Duanne Moron) 0.25 MG tablet  12/16/18  Yes [provider]  cetirizine (ZYRTEC) 10 MG tablet Take 10 mg by mouth as needed for allergies.   Yes [provider]  albuterol (VENTOLIN HFA) 108 (90 Base) MCG/ACT inhaler Inhale 2 puffs into the lungs every 6 (six) hours as needed for shortness of breath. 10/11/19   Julian Hy, DO  azelastine (ASTELIN) 0.1 % nasal spray Place 1 spray into both nostrils 2 (two) times daily. Use in each nostril as directed 10/25/19   Martyn Ehrich, NP  cholecalciferol  (VITAMIN D3) 25 MCG (1000 UNIT) tablet Take 1,000 Units by mouth daily.    [provider]  cyanocobalamin 2000 MCG tablet Take 2,000 mcg by mouth daily.    [provider]  doxycycline (VIBRA-TABS) 100 MG tablet Take 1 tablet (100 mg total) by mouth 2 (two) times daily. 01/14/21   Edrick Kins, DPM  DULoxetine (CYMBALTA) 60 MG capsule Take 1 capsule (60 mg total) by mouth 2 (two) times daily. 05/02/18   Lomax, Amy, NP  gabapentin (NEURONTIN) 600 MG tablet Take 2 tablets in am and pm and 1 tablet at noon. Patient not taking: Reported on 06/04/2020 05/24/18   Kathrynn Ducking, MD  HYDROcodone-acetaminophen (NORCO/VICODIN) 5-325 MG tablet Take 1 tablet by mouth every 4 (four) hours as needed. 11/09/19   Charlann Lange, PA-C  losartan-hydrochlorothiazide (HYZAAR) 50-12.5 MG tablet Take 1 tablet by mouth daily.  07/07/18   [provider]  meloxicam (MOBIC) 7.5 MG tablet Take 1 tablet (7.5 mg total) by mouth 2 (two) times daily as needed for pain. 05/09/20   Leandrew Koyanagi, MD  methocarbamol (ROBAXIN) 500 MG tablet Take 1 tablet (500 mg total) by mouth 2 (two) times daily as needed. 07/10/20   Aundra Dubin, PA-C  MYRBETRIQ 50 MG TB24 tablet Take 50 mg by mouth daily.  03/29/17   [provider]  ondansetron (ZOFRAN ODT) 4 MG disintegrating tablet Take 1 tablet (4 mg total) by mouth every 8 (eight) hours as needed for nausea or vomiting. 11/09/19   Charlann Lange, PA-C  pantoprazole (PROTONIX) 40 MG tablet Take 40 mg by mouth 2 (two) times daily.    [provider]  potassium chloride SA (K-DUR) 20 MEQ tablet Take 1 tablet (20 mEq total) by mouth daily. Take one tablet twice a day for five days, then one tablet once a day 10/05/27   Delora Fuel, MD  pregabalin (LYRICA) 150 MG capsule 150 mg 2 (two) times daily. 01/11/20   [provider]  rOPINIRole (REQUIP) 2 MG tablet Take 1 tablet (2 mg total) by mouth at bedtime. 05/02/18 08/02/27  Lomax, Amy, NP  traMADol  (ULTRAM) 50 MG tablet Take 1-2 tablets (50-100 mg total) by mouth every 6 (six) hours as needed. 11/07/18   Edrick Kins, DPM    Allergies    Codeine, Diclofenac sodium, and Flonase [fluticasone]  Review of Systems   Review of Systems  Constitutional:  Negative for chills, fatigue and fever.  HENT:  Negative for ear pain and sore throat.   Eyes:  Negative for pain and visual disturbance.  Respiratory:  Negative for cough and shortness of  breath.   Cardiovascular:  Negative for chest pain and palpitations.  Gastrointestinal:  Positive for abdominal pain. Negative for anorexia, constipation, diarrhea, hematemesis, hematochezia, nausea and vomiting.  Genitourinary:  Negative for dysuria, hematuria and vaginal bleeding.  Musculoskeletal:  Negative for arthralgias and back pain.  Skin:  Negative for color change and rash.  Neurological:  Negative for seizures and syncope.  All other systems reviewed and are negative.  Physical Exam Updated Vital Signs BP (!) 141/80   Pulse (!) 33   Temp 97.8 F (36.6 C) (Oral)   Resp 17   Ht 5\' 4"  (1.626 m)   Wt 117.5 kg   SpO2 100%   BMI 44.46 kg/m   Physical Exam Vitals and nursing note reviewed.  Constitutional:      General: She is not in acute distress.    Appearance: She is well-developed. She is not ill-appearing.  HENT:     Head: Normocephalic and atraumatic.  Eyes:     Extraocular Movements: Extraocular movements intact.     Conjunctiva/sclera: Conjunctivae normal.     Pupils: Pupils are equal, round, and reactive to light.  Cardiovascular:     Rate and Rhythm: Normal rate and regular rhythm.     Heart sounds: Normal heart sounds. No murmur heard. Pulmonary:     Effort: Pulmonary effort is normal. No respiratory distress.     Breath sounds: Normal breath sounds.  Abdominal:     Palpations: Abdomen is soft.     Tenderness: There is abdominal tenderness in the right lower quadrant. There is no guarding.  Musculoskeletal:         General: No swelling.     Cervical back: Neck supple.  Skin:    General: Skin is warm and dry.     Capillary Refill: Capillary refill takes less than 2 seconds.  Neurological:     General: No focal deficit present.     Mental Status: She is alert.  Psychiatric:        Mood and Affect: Mood normal.    ED Results / Procedures / Treatments   Labs (all labs ordered are listed, but only abnormal results are displayed) Labs Reviewed  CBC WITH DIFFERENTIAL/PLATELET - Abnormal; Notable for the following components:      Result Value   RDW 15.8 (*)    All other components within normal limits  COMPREHENSIVE METABOLIC PANEL - Abnormal; Notable for the following components:   Glucose, Bld 144 (*)    Creatinine, Ser 1.08 (*)    GFR, Estimated 56 (*)    All other components within normal limits  URINALYSIS, ROUTINE W REFLEX MICROSCOPIC - Abnormal; Notable for the following components:   Specific Gravity, Urine >1.046 (*)    Protein, ur 30 (*)    Leukocytes,Ua TRACE (*)    Bacteria, UA RARE (*)    All other components within normal limits  LIPASE, BLOOD    EKG None  Radiology CT ABDOMEN PELVIS W CONTRAST  Result Date: 02/18/2021 CLINICAL DATA:  Right lower quadrant pain EXAM: CT ABDOMEN AND PELVIS WITH CONTRAST TECHNIQUE: Multidetector CT imaging of the abdomen and pelvis was performed using the standard protocol following bolus administration of intravenous contrast. CONTRAST:  131mL OMNIPAQUE IOHEXOL 300 MG/ML  SOLN COMPARISON:  CT 10/26/2019 FINDINGS: Lower chest: Lung bases demonstrate no acute consolidation or effusion. Mild cardiomegaly Hepatobiliary: No focal liver abnormality is seen. Status post cholecystectomy. No biliary dilatation. Pancreas: Unremarkable. No pancreatic ductal dilatation or surrounding inflammatory changes. Spleen: Normal  in size without focal abnormality. Adrenals/Urinary Tract: Adrenal glands are normal. Small cyst upper pole right kidney. Subcentimeter  hypodense lesion in the mid right kidney too small to further characterize. Probable small parapelvic cysts bilaterally. No significant hydronephrosis. The bladder is normal Stomach/Bowel: Stomach is within normal limits. Appendix not well seen but no right lower quadrant inflammatory process. No evidence of bowel wall thickening, distention, or inflammatory changes. Vascular/Lymphatic: Mild aortic atherosclerosis. No aneurysm. No suspicious nodes Reproductive: Status post hysterectomy. No adnexal masses. Other: Negative for pelvic effusion or free air. Small fat containing umbilical hernia Musculoskeletal: Degenerative changes of the spine IMPRESSION: 1. No CT evidence for acute intra-abdominal or pelvic abnormality. Electronically Signed   By: Donavan Foil M.D.   On: 02/18/2021 21:30    Procedures Procedures   Medications Ordered in ED Medications  iohexol (OMNIPAQUE) 300 MG/ML solution 100 mL (100 mLs Intravenous Contrast Given 02/18/21 2116)    ED Course  I have reviewed the triage vital signs and the nursing notes.  Pertinent labs & imaging results that were available during my care of the patient were reviewed by me and considered in my medical decision making (see chart for details).    MDM Rules/Calculators/A&P                           [name] [name] is a 65 year old female who presents the ED with lower abdominal pain.  History of depression hypertension.  History of restless legs.  Pain on and off for the last several months but worse the last several days.  Having good bowel movements.  Has had a ureteral sling in the past.  Will get CT scan to evaluate for kidney stone or bowel obstruction or appendicitis.  This could be scar tissue pain or muscular pain.  Sounds like pain at times is radiating to the right groin and pain appears to be moving the right hip flexor area.  No obvious hernia on exam.  Denies any pain with urination.  We will check basic labs as well.  CT scan  negative for appendicitis or bowel obstruction.  Urinalysis negative for infection.  Overall work-up is unremarkable.  Suspect muscular process.  Recommend follow-up with primary care doctor.  Discharged in good condition.  This chart was dictated using voice recognition software.  Despite best efforts to proofread,  errors can occur which can change the documentation meaning.   Final Clinical Impression(s) / ED Diagnoses Final diagnoses:  Right lower quadrant abdominal pain    Rx / DC Orders ED Discharge Orders     None        Lennice Sites, DO 02/18/21 2243

## 2021-02-18 NOTE — Discharge Instructions (Signed)
Recommend continued use of Tylenol and ibuprofen for pain.  Follow-up with your primary care doctor.

## 2021-03-14 DIAGNOSIS — F419 Anxiety disorder, unspecified: Secondary | ICD-10-CM | POA: Diagnosis not present

## 2021-03-14 DIAGNOSIS — F324 Major depressive disorder, single episode, in partial remission: Secondary | ICD-10-CM | POA: Diagnosis not present

## 2021-03-14 DIAGNOSIS — Z23 Encounter for immunization: Secondary | ICD-10-CM | POA: Diagnosis not present

## 2021-03-14 DIAGNOSIS — R1011 Right upper quadrant pain: Secondary | ICD-10-CM | POA: Diagnosis not present

## 2021-03-14 DIAGNOSIS — G629 Polyneuropathy, unspecified: Secondary | ICD-10-CM | POA: Diagnosis not present

## 2021-03-14 DIAGNOSIS — I10 Essential (primary) hypertension: Secondary | ICD-10-CM | POA: Diagnosis not present

## 2021-03-14 DIAGNOSIS — R0609 Other forms of dyspnea: Secondary | ICD-10-CM | POA: Diagnosis not present

## 2021-03-14 DIAGNOSIS — L4050 Arthropathic psoriasis, unspecified: Secondary | ICD-10-CM | POA: Diagnosis not present

## 2021-03-19 DIAGNOSIS — N3281 Overactive bladder: Secondary | ICD-10-CM | POA: Diagnosis not present

## 2021-03-26 NOTE — Progress Notes (Signed)
Synopsis: Referred for episodic dyspnea by Harlan Stains, MD  Subjective:   PATIENT ID: [name] [name] GENDER: female DOB: June 13, 1955, MRN: [id number]  Chief Complaint  Patient presents with   Follow-up    Former patient of Dr Carlis Abbott. Pt c/o increased DOE x 2 months- comes and goes. She has noticed some swelling in her feet. Has gained 31 lbs since the last visit. Rarely uses her albuterol.    65yF with history of AR, psoriasis/psoriatic arthritis - was on cosyntex (secukinumab, last dose 3 months ago), RLS, GERD. Finished prednisone pack for arthritis 2 weeks ago.   She saw her PCP and she reported breathing spells where she's suddenly dyspneic. Usually when she's out doing something. It's very abrupt when it starts. Always happens when she's exerting herself. It does get better with stopping to rest. Normal stress test in 2015 but she wasn't having these same symptoms back then that she can recall. Occasional chest tightness/pressure sensation. She does think these breathing episodes were improved substantially by course of prednisone. She does have reflux and hiatal hernia but protonix helps. Hasn't tried inhaler when she has one of these attacks.   Otherwise pertinent review of systems is negative.  No family history of lung disease.   Never smoked. Did have some secondhand exposure.   Past Medical History:  Diagnosis Date   Allergic rhinitis    Arthritis    Depression    Factor V Leiden carrier ([place]) 08/26/2011   Fatty liver    Fibroids    dysfuction uterine bleeding   GERD (gastroesophageal reflux disease)    Hypertension    Osteoarthritis    Peripheral neuropathy    Psoriatic arthritis (HCC)    Restless leg syndrome      Family History  Problem Relation Age of Onset   Coronary artery disease Father 14       deceased- TIAs   Stroke Father    Hypertension Mother 82       alive   Deep vein thrombosis Mother    Neuropathy Mother    Osteoporosis Mother    Liver  disease Sister    Other Sister        ITP   Cancer Sister        breast   Heart failure Maternal Grandmother      Past Surgical History:  Procedure Laterality Date   CHOLECYSTECTOMY     KNEE SURGERY     right TKR   LUMBAR SPINE SURGERY     TUBAL LIGATION     bilateral   URETHRAL SLING     VAGINAL HYSTERECTOMY      Social History   Socioeconomic History   Marital status: Widowed    Spouse name: Not on file   Number of children: 2   Years of education: Not on file   Highest education level: Not on file  Occupational History   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  Is retired, has 2 yr college degree.  Has 2 children.  Drinks caffeine.  Social Determinants of Health   Financial Resource Strain: Not on file  Food Insecurity: Not on file  Transportation Needs: Not on file  Physical Activity: Not on file  Stress: Not on file  Social Connections: Not on file  Intimate Partner Violence: Not on file     Allergies  Allergen Reactions   Codeine Nausea Only   Diclofenac Sodium Other (See Comments)    Worsens RLS   Flonase [Fluticasone] Itching     Outpatient Medications Prior to Visit  Medication Sig Dispense Refill   acetaminophen (TYLENOL) 325 MG tablet Take 650 mg by mouth every 6 (six) hours as needed for mild pain.     ALPRAZolam (XANAX) 0.25 MG tablet      azelastine (ASTELIN) 0.1 % nasal spray Place 1 spray into both nostrils 2 (two) times daily. Use in each nostril as directed 30 mL 5   cetirizine (ZYRTEC) 10 MG tablet Take 10 mg by mouth as needed for allergies.     cholecalciferol (VITAMIN D3)  25 MCG (1000 UNIT) tablet Take 1,000 Units by mouth daily.     DULoxetine (CYMBALTA) 60 MG capsule Take 1 capsule (60 mg total) by mouth 2 (two) times daily. 180 capsule 4   HYDROcodone-acetaminophen (NORCO/VICODIN) 5-325 MG tablet Take 1 tablet by mouth every 4 (four) hours as needed. 8 tablet 0   losartan-hydrochlorothiazide (HYZAAR) 50-12.5 MG tablet Take 1 tablet by mouth daily.      meloxicam (MOBIC) 7.5 MG tablet Take 1 tablet (7.5 mg total) by mouth 2 (two) times daily as needed for pain. 30 tablet 2   methocarbamol (ROBAXIN) 500 MG tablet Take 1 tablet (500 mg total) by mouth 2 (two) times daily as needed. 20 tablet 0   MYRBETRIQ 50 MG TB24 tablet Take 50 mg by mouth daily.      pantoprazole (PROTONIX) 40 MG tablet Take 40 mg by mouth 2 (two) times daily.     potassium chloride SA (K-DUR) 20 MEQ tablet Take 1 tablet (20 mEq total) by mouth daily. Take one tablet twice a day for five days, then one tablet once a day (Patient taking differently: Take 20 mEq by mouth daily.) 35 tablet 0   pregabalin (LYRICA) 150 MG capsule Take 150 mg by mouth 2 (two) times daily.     pregabalin (LYRICA) 75 MG capsule Take 75 mg by mouth at bedtime. In addition to the 150 mg bid     rOPINIRole (REQUIP) 2 MG tablet Take 1 tablet (2 mg total) by mouth at bedtime. 90 tablet 4   traMADol (ULTRAM) 50 MG tablet Take 1-2 tablets (50-100 mg total) by mouth every 6 (six) hours as needed. 60 tablet 0   albuterol (VENTOLIN HFA) 108 (90 Base) MCG/ACT inhaler Inhale 2 puffs into the lungs every 6 (six) hours as needed for shortness of breath. 18 g 11   cyanocobalamin 2000 MCG tablet Take 2,000 mcg by mouth daily.     doxycycline (VIBRA-TABS) 100 MG tablet Take 1 tablet (100 mg total) by mouth 2 (two) times daily. 20 tablet 0   gabapentin (NEURONTIN) 600 MG tablet Take 2 tablets in am and pm and 1 tablet at noon. (Patient not taking: Reported on 06/04/2020) 150 tablet 11   ondansetron (ZOFRAN ODT) 4 MG disintegrating tablet  Take 1 tablet (4 mg total) by mouth every 8 (eight) hours as needed for nausea or vomiting. 20 tablet 0   No facility-administered medications prior to visit.  Objective:   Physical Exam:  General appearance: 65 y.o., female, NAD, conversant  Eyes: anicteric sclerae; PERRL, tracking appropriately HENT: NCAT; MMM Neck: Trachea midline; no lymphadenopathy, no JVD Lungs: CTAB, no crackles, no wheeze, with normal respiratory effort CV: RRR, no murmur  Abdomen: Soft, non-tender; non-distended, BS present  Extremities: No peripheral edema, warm Skin: Normal turgor and texture; no rash Psych: Appropriate affect Neuro: Alert and oriented to person and place, no focal deficit     Vitals:   03/28/21 1019  BP: 124/68  Pulse: 80  Temp: 98.7 F (37.1 C)  TempSrc: Oral  SpO2: 98%  Weight: 262 lb (118.8 kg)  Height: 5\' 4"  (1.626 m)   98% on RA BMI Readings from Last 3 Encounters:  03/28/21 44.97 kg/m  02/18/21 44.46 kg/m  01/22/20 39.48 kg/m   Wt Readings from Last 3 Encounters:  03/28/21 262 lb (118.8 kg)  02/18/21 259 lb (117.5 kg)  01/22/20 230 lb (104.3 kg)     CBC    Component Value Date/Time   WBC 7.2 02/18/2021 2000   RBC 4.78 02/18/2021 2000   HGB 12.9 02/18/2021 2000   HGB 13.2 08/26/2011 1239   HCT 40.5 02/18/2021 2000   HCT 39.8 08/26/2011 1239   PLT 215 02/18/2021 2000   PLT 195 08/26/2011 1239   MCV 84.7 02/18/2021 2000   MCV 88.3 08/26/2011 1239   MCH 27.0 02/18/2021 2000   MCHC 31.9 02/18/2021 2000   RDW 15.8 (H) 02/18/2021 2000   RDW 15.7 (H) 08/26/2011 1239   LYMPHSABS 2.0 02/18/2021 2000   LYMPHSABS 1.6 08/26/2011 1239   MONOABS 0.5 02/18/2021 2000   MONOABS 0.4 08/26/2011 1239   EOSABS 0.1 02/18/2021 2000   EOSABS 0.1 08/26/2011 1239   BASOSABS 0.1 02/18/2021 2000   BASOSABS 0.0 08/26/2011 1239   BNP normal today  Chest Imaging: CT A/P 11/22 reviewed by me lung bases unremarkable seemingly with interval resolution of  previously noted small nodules  Pulmonary Functions Testing Results: PFT Results Latest Ref Rng & Units 08/10/2019  FVC-Pre L 2.05  FVC-Predicted Pre % 69  FVC-Post L 2.09  FVC-Predicted Post % 70  Pre FEV1/FVC % % 88  Post FEV1/FCV % % 92  FEV1-Pre L 1.80  FEV1-Predicted Pre % 80  FEV1-Post L 1.91  DLCO uncorrected ml/min/mmHg 15.09  DLCO UNC% % 80  DLVA Predicted % 106  TLC L 3.61  TLC % Predicted % 74  RV % Predicted % 70   Reviewed by me mild restriction, no BD effect, normal DLCO, FV loops with a little bit of expiratory flattening   Echocardiogram:  TTE with G1DD    Assessment & Plan:   # episodic dyspnea:  Plan: - nuke stress test - start albuterol inhaler prn - if nuke stress test low risk and symptoms persist then methacholine challenge     Maryjane Hurter, MD [facility] 03/28/2021 10:45 AM

## 2021-03-28 ENCOUNTER — Ambulatory Visit: Payer: Medicare PPO | Admitting: Student

## 2021-03-28 ENCOUNTER — Encounter: Payer: Self-pay | Admitting: Student

## 2021-03-28 ENCOUNTER — Other Ambulatory Visit: Payer: Self-pay

## 2021-03-28 VITALS — BP 124/68 | HR 80 | Temp 98.7°F | Ht 64.0 in | Wt 262.0 lb

## 2021-03-28 DIAGNOSIS — R0609 Other forms of dyspnea: Secondary | ICD-10-CM | POA: Diagnosis not present

## 2021-03-28 LAB — BRAIN NATRIURETIC PEPTIDE: Pro B Natriuretic peptide (BNP): 56 pg/mL (ref 0.0–100.0)

## 2021-03-28 MED ORDER — ALBUTEROL SULFATE HFA 108 (90 BASE) MCG/ACT IN AERS: 2.0000 | INHALATION_SPRAY | Freq: Four times a day (QID) | RESPIRATORY_TRACT | 11 refills | Status: AC | PRN

## 2021-03-28 NOTE — Patient Instructions (Addendum)
-   lab before you go - you will be called to schedule your nuclear medicine stress test - if you have questions about your results before visit call or send a message - try your albuterol inhaler for these episodes of shortness of breath

## 2021-03-31 DIAGNOSIS — Z20822 Contact with and (suspected) exposure to covid-19: Secondary | ICD-10-CM | POA: Diagnosis not present

## 2021-04-03 ENCOUNTER — Other Ambulatory Visit: Payer: Self-pay | Admitting: Student

## 2021-04-03 DIAGNOSIS — R0609 Other forms of dyspnea: Secondary | ICD-10-CM

## 2021-04-04 DIAGNOSIS — M7918 Myalgia, other site: Secondary | ICD-10-CM | POA: Diagnosis not present

## 2021-04-04 DIAGNOSIS — L4050 Arthropathic psoriasis, unspecified: Secondary | ICD-10-CM | POA: Diagnosis not present

## 2021-04-04 DIAGNOSIS — G8929 Other chronic pain: Secondary | ICD-10-CM | POA: Diagnosis not present

## 2021-04-04 DIAGNOSIS — M65332 Trigger finger, left middle finger: Secondary | ICD-10-CM | POA: Diagnosis not present

## 2021-04-04 DIAGNOSIS — L409 Psoriasis, unspecified: Secondary | ICD-10-CM | POA: Diagnosis not present

## 2021-04-04 DIAGNOSIS — Z79899 Other long term (current) drug therapy: Secondary | ICD-10-CM | POA: Diagnosis not present

## 2021-04-04 DIAGNOSIS — R5382 Chronic fatigue, unspecified: Secondary | ICD-10-CM | POA: Diagnosis not present

## 2021-04-04 DIAGNOSIS — M2550 Pain in unspecified joint: Secondary | ICD-10-CM | POA: Diagnosis not present

## 2021-04-04 DIAGNOSIS — M150 Primary generalized (osteo)arthritis: Secondary | ICD-10-CM | POA: Diagnosis not present

## 2021-04-07 ENCOUNTER — Telehealth (HOSPITAL_COMMUNITY): Payer: Self-pay | Admitting: *Deleted

## 2021-04-07 NOTE — Telephone Encounter (Signed)
Left message on voicemail per DPR in reference to upcoming appointment scheduled on 04/14/21 at 1:00 with detailed instructions given per Myocardial Perfusion Study Information Sheet for the test. LM to arrive 15 minutes early, and that it is imperative to arrive on time for appointment to keep from having the test rescheduled. If you need to cancel or reschedule your appointment, please call the office within 24 hours of your appointment. Failure to do so may result in a cancellation of your appointment, and a $50 no show fee. Phone number given for call back for any questions.

## 2021-04-14 ENCOUNTER — Ambulatory Visit (HOSPITAL_COMMUNITY): Payer: Medicare PPO | Attending: Student

## 2021-04-14 ENCOUNTER — Other Ambulatory Visit: Payer: Self-pay

## 2021-04-14 DIAGNOSIS — R0609 Other forms of dyspnea: Secondary | ICD-10-CM | POA: Diagnosis not present

## 2021-04-14 MED ORDER — TECHNETIUM TC 99M TETROFOSMIN IV KIT
32.6000 | PACK | Freq: Once | INTRAVENOUS | Status: AC | PRN
  Administered 2021-04-14: 32.6 via INTRAVENOUS
  Filled 2021-04-14: qty 33

## 2021-04-14 MED ORDER — REGADENOSON 0.4 MG/5ML IV SOLN
0.4000 mg | Freq: Once | INTRAVENOUS | Status: AC
  Administered 2021-04-14: 0.4 mg via INTRAVENOUS

## 2021-04-15 ENCOUNTER — Other Ambulatory Visit: Payer: Self-pay

## 2021-04-15 ENCOUNTER — Ambulatory Visit (HOSPITAL_COMMUNITY): Payer: Medicare PPO | Attending: Cardiology

## 2021-04-15 LAB — MYOCARDIAL PERFUSION IMAGING
Base ST Depression (mm): 0 mm
LV dias vol: 63 mL (ref 46–106)
LV sys vol: 24 mL
Nuc Stress EF: 63 %
Peak HR: 80 {beats}/min
Rest HR: 62 {beats}/min
Rest Nuclear Isotope Dose: 32.6 mCi
SDS: 1
SRS: 0
SSS: 1
ST Depression (mm): 0 mm
Stress Nuclear Isotope Dose: 32.6 mCi
TID: 0.96

## 2021-04-15 MED ORDER — TECHNETIUM TC 99M TETROFOSMIN IV KIT
32.6000 | PACK | Freq: Once | INTRAVENOUS | Status: AC | PRN
  Administered 2021-04-15: 32.6 via INTRAVENOUS
  Filled 2021-04-15: qty 33

## 2021-04-24 ENCOUNTER — Other Ambulatory Visit: Payer: Self-pay | Admitting: Gastroenterology

## 2021-04-24 DIAGNOSIS — R1011 Right upper quadrant pain: Secondary | ICD-10-CM | POA: Diagnosis not present

## 2021-04-24 IMAGING — US US EXTREM LOW VENOUS
1 series · 14 of 24 positions shown · non-contrast
Comparison: None.

CLINICAL DATA: Leg swelling, pain.  No PE on chest CTA.

EXAM:
BILATERAL LOWER EXTREMITY VENOUS DOPPLER ULTRASOUND
TECHNIQUE: Gray-scale sonography with compression, as well as color and duplex
ultrasound, were performed to evaluate the deep venous system(s)
from the level of the common femoral vein through the popliteal and
proximal calf veins.

[Series 1: us extrem low venous · 0.07mm/px · 14 of 73 slices shown]
[im 1/73]
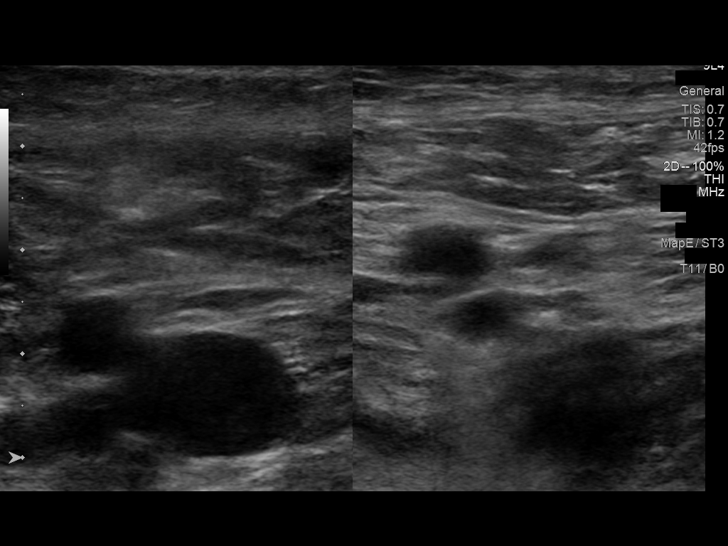
[im 7/73]
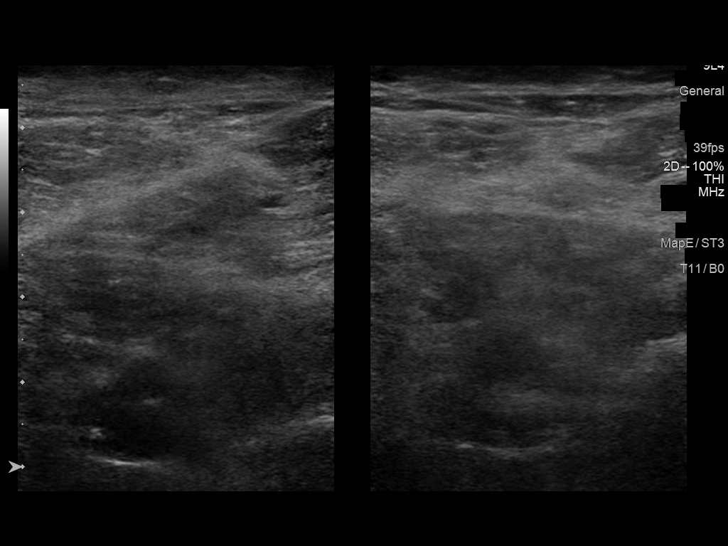
[im 13/73]
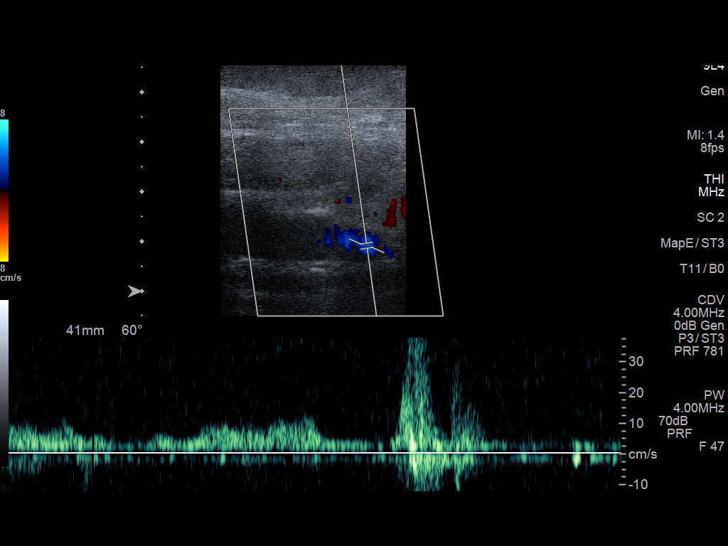
[im 19/73]
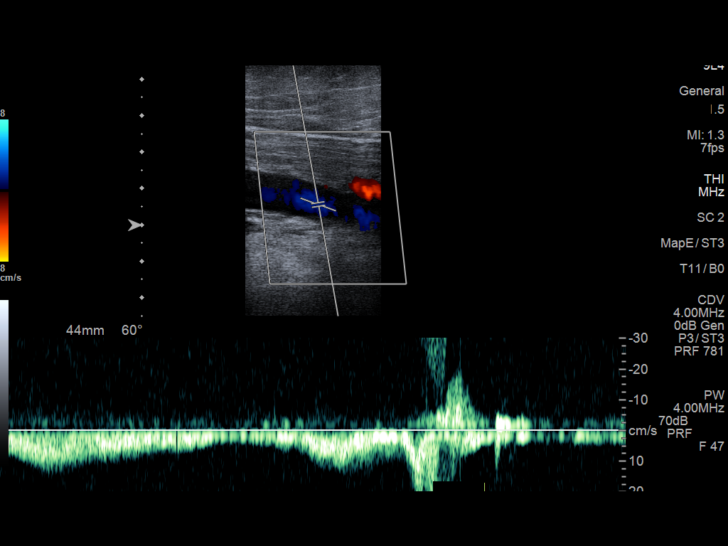
[im 22/73]
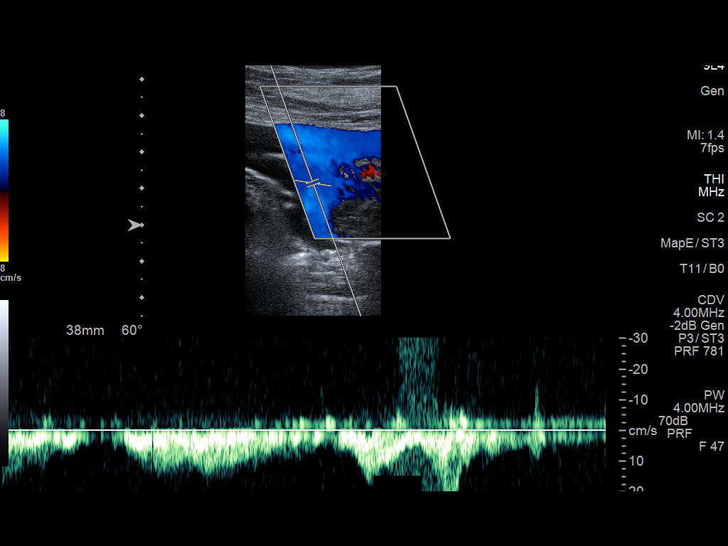
[im 29/73]
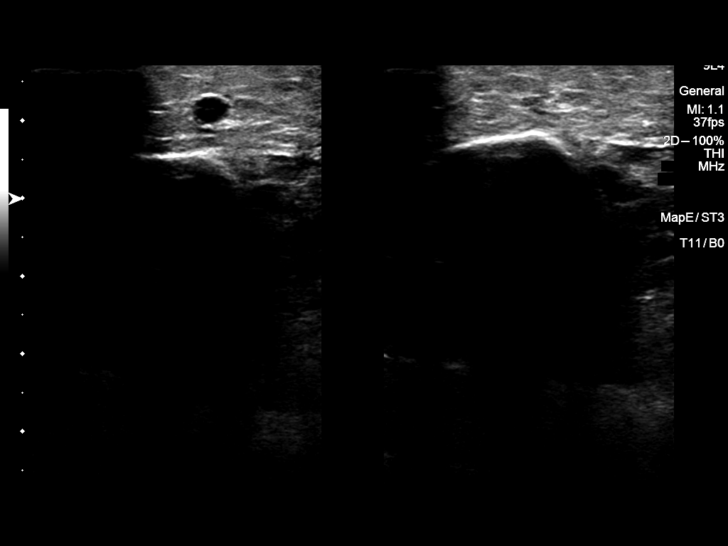
[im 35/73]
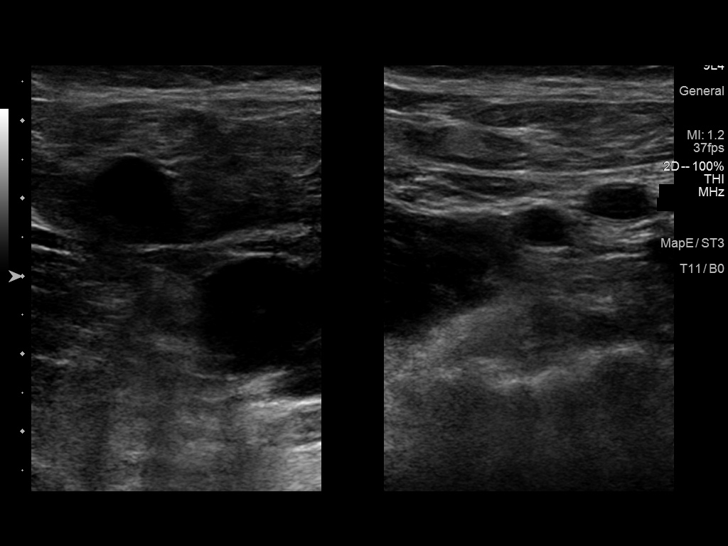
[im 38/73]
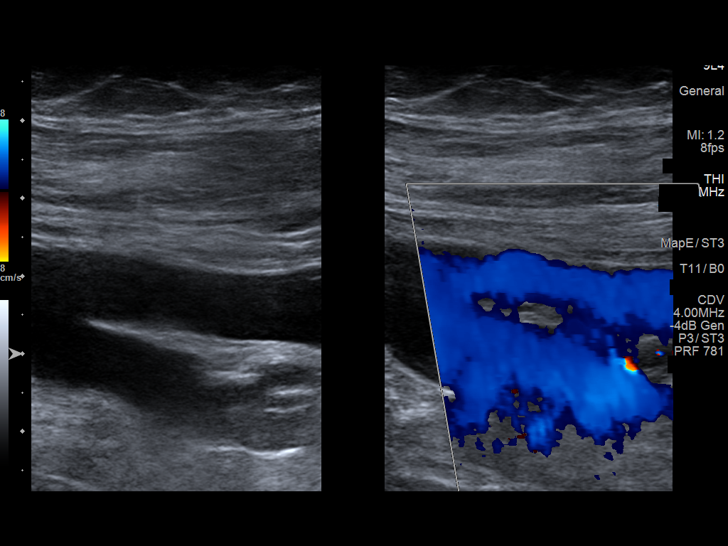
[im 44/73]
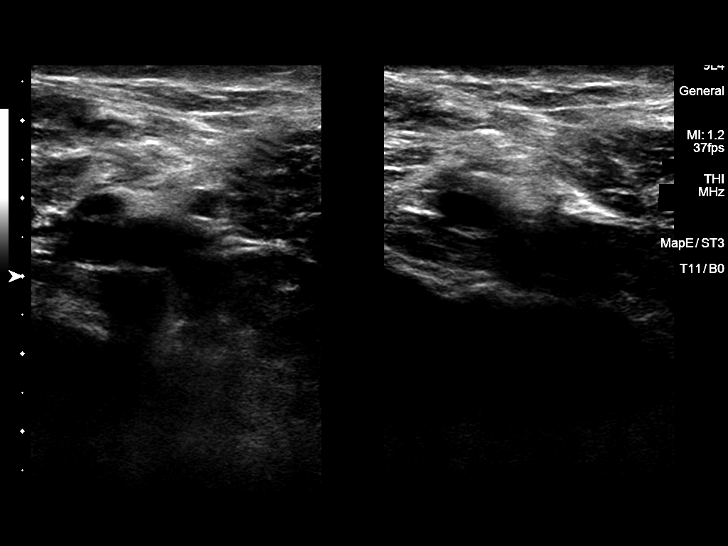
[im 51/73]
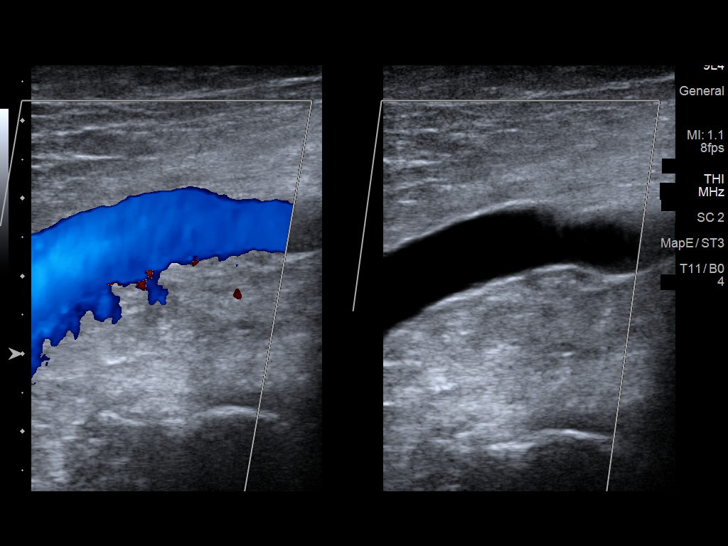
[im 57/73]
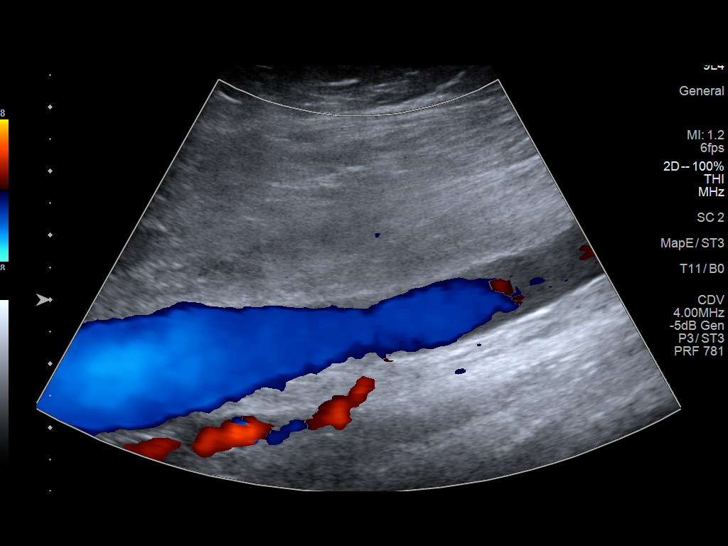
[im 60/73]
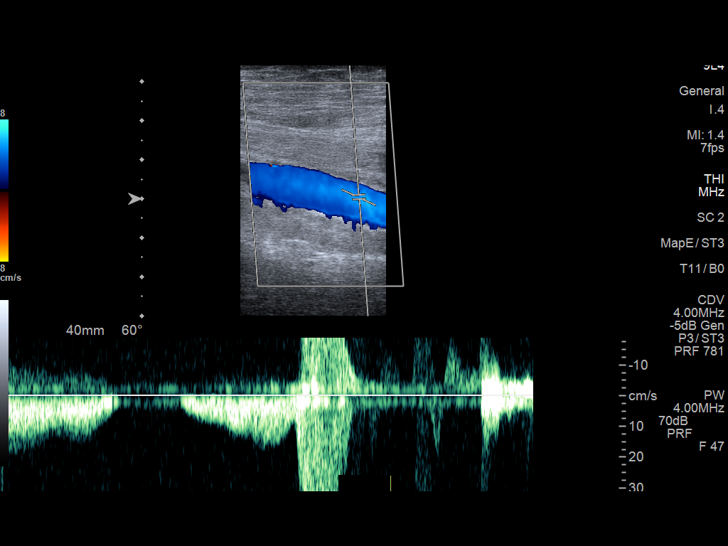
[im 66/73]
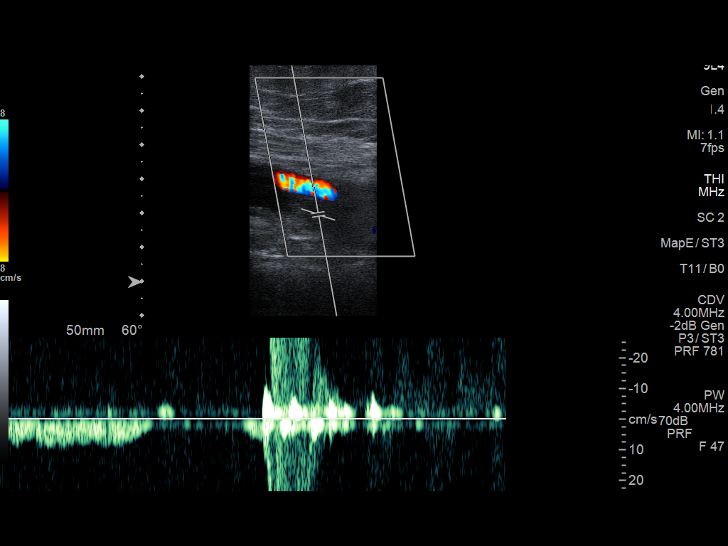
[im 73/73]
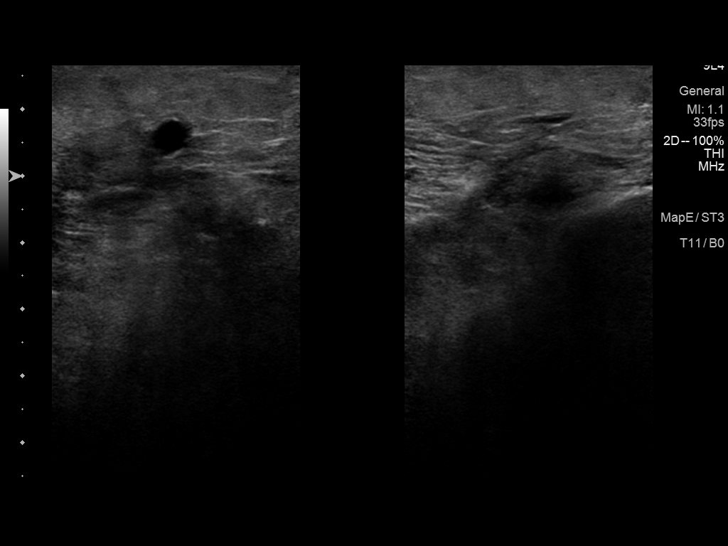

[14 of 24 positions shown; findings below may reference images not displayed]

FINDINGS: VENOUS

Normal compressibility of the common femoral, superficial femoral,
and popliteal veins, as well as the visualized calf veins.
Visualized portions of profunda femoral vein and great saphenous
vein unremarkable. No filling defects to suggest DVT on grayscale or
color Doppler imaging. Doppler waveforms show normal direction of
venous flow, normal respiratory phasicity and response to
augmentation.

OTHER

None.

Limitations: none
IMPRESSION: No femoropopliteal DVT nor evidence of DVT within the visualized
calf veins.

If clinical symptoms are inconsistent or if there are persistent or
worsening symptoms, further imaging (possibly involving the iliac
veins) may be warranted.

## 2021-04-28 ENCOUNTER — Ambulatory Visit
Admission: RE | Admit: 2021-04-28 | Discharge: 2021-04-28 | Disposition: A | Discharge status: Home / Self Care | Payer: Medicare PPO | Source: Ambulatory Visit | Attending: Gastroenterology | Admitting: Gastroenterology

## 2021-04-28 DIAGNOSIS — R1011 Right upper quadrant pain: Secondary | ICD-10-CM

## 2021-04-28 DIAGNOSIS — K760 Fatty (change of) liver, not elsewhere classified: Secondary | ICD-10-CM | POA: Diagnosis not present

## 2021-04-28 DIAGNOSIS — R932 Abnormal findings on diagnostic imaging of liver and biliary tract: Secondary | ICD-10-CM | POA: Diagnosis not present

## 2021-04-28 DIAGNOSIS — Z9049 Acquired absence of other specified parts of digestive tract: Secondary | ICD-10-CM | POA: Diagnosis not present

## 2021-05-02 ENCOUNTER — Other Ambulatory Visit: Payer: Self-pay

## 2021-05-02 ENCOUNTER — Encounter: Payer: Self-pay | Admitting: Podiatry

## 2021-05-02 ENCOUNTER — Ambulatory Visit: Payer: Medicare PPO | Admitting: Podiatry

## 2021-05-02 DIAGNOSIS — M79675 Pain in left toe(s): Secondary | ICD-10-CM | POA: Diagnosis not present

## 2021-05-02 DIAGNOSIS — E0843 Diabetes mellitus due to underlying condition with diabetic autonomic (poly)neuropathy: Secondary | ICD-10-CM | POA: Diagnosis not present

## 2021-05-02 DIAGNOSIS — L989 Disorder of the skin and subcutaneous tissue, unspecified: Secondary | ICD-10-CM | POA: Diagnosis not present

## 2021-05-02 DIAGNOSIS — B351 Tinea unguium: Secondary | ICD-10-CM | POA: Diagnosis not present

## 2021-05-02 DIAGNOSIS — M79674 Pain in right toe(s): Secondary | ICD-10-CM | POA: Diagnosis not present

## 2021-05-02 NOTE — Progress Notes (Signed)
SUBJECTIVE Patient with a history of diabetes mellitus presents to office today complaining of elongated, thickened nails that cause pain while ambulating in shoes.  Patient is unable to trim their own nails.  Patient also has symptomatic calluses that are very tender and painful to the bilateral feet.  She is requesting to have them debrided today.  She says she never goes barefoot and she wears her diabetic shoes and insoles as instructed.  Patient is here for further evaluation and treatment.   Past Medical History:  Diagnosis Date   Allergic rhinitis    Arthritis    Depression    Factor V Leiden carrier ([place]) 08/26/2011   Fatty liver    Fibroids    dysfuction uterine bleeding   GERD (gastroesophageal reflux disease)    Hypertension    Osteoarthritis    Peripheral neuropathy    Psoriatic arthritis (HCC)    Restless leg syndrome    Past Surgical History:  Procedure Laterality Date   CHOLECYSTECTOMY     KNEE SURGERY     right TKR   LUMBAR SPINE SURGERY     TUBAL LIGATION     bilateral   URETHRAL SLING     VAGINAL HYSTERECTOMY     Allergies  Allergen Reactions   Codeine Nausea Only   Diclofenac Sodium Other (See Comments)    Worsens RLS   Flonase [Fluticasone] Itching      OBJECTIVE General Patient is awake, alert, and oriented x 3 and in no acute distress. Derm Skin is dry and supple bilateral. Negative open lesions or macerations. Remaining integument unremarkable. Nails are tender, long, thickened and dystrophic with subungual debris, consistent with onychomycosis, 1-5 bilateral. No signs of infection noted.  Hyperkeratotic preulcerative callus tissue was also noted to the bilateral feet.  After debridement of the preulcerative calluses to the right forefoot there were some small wounds that extended beyond the dermal tissues.  These appear very stable.  There is no malodor.  Serous drainage. Vasc  DP and PT pedal pulses palpable bilaterally. Temperature gradient  within normal limits.  Neuro Epicritic and protective threshold sensation diminished bilaterally.  Musculoskeletal Exam No symptomatic pedal deformities noted bilateral. Muscular strength within normal limits.  ASSESSMENT 1. Diabetes Mellitus w/ peripheral neuropathy 2.  Pain due to onychomycosis of toenails bilateral 3.  Preulcerative callus lesions bilateral feet.  Right foot actually has some lesions that extend through the dermis.  PLAN OF CARE 1. Patient evaluated today. 2. Instructed to maintain good pedal hygiene and foot care. Stressed importance of controlling blood sugar.  3. Mechanical debridement of nails 1-5 bilaterally performed using a nail nipper. Filed with dremel without incident.  4.  Excisional debridement of the hyperkeratotic calluses was performed using an 312 scalpel.  Under the calluses there were some wounds noted to the right forefoot.  Please see above noted photo.  Light debridement of these wounds were also performed return to clinic in 3 mos.  5.  Recommend antibiotic ointment and a Band-Aid daily.  Patient has gentamicin cream at home 6.  Continue diabetic shoes and insoles 7.  Return to clinic 10 weeks or sooner if she sees worsening appearance of the wounds to the foot    Edrick Kins, DPM [facility]  Dr. Edrick Kins, DPM    [address]  [place], [place] [address]                Office [phone]  Fax [phone]

## 2021-05-12 DIAGNOSIS — G629 Polyneuropathy, unspecified: Secondary | ICD-10-CM | POA: Diagnosis not present

## 2021-05-12 DIAGNOSIS — Z8639 Personal history of other endocrine, nutritional and metabolic disease: Secondary | ICD-10-CM | POA: Diagnosis not present

## 2021-05-12 DIAGNOSIS — Z79899 Other long term (current) drug therapy: Secondary | ICD-10-CM | POA: Diagnosis not present

## 2021-05-12 DIAGNOSIS — R7982 Elevated C-reactive protein (CRP): Secondary | ICD-10-CM | POA: Diagnosis not present

## 2021-05-12 DIAGNOSIS — L4050 Arthropathic psoriasis, unspecified: Secondary | ICD-10-CM | POA: Diagnosis not present

## 2021-05-12 NOTE — Progress Notes (Signed)
Synopsis: Referred for episodic dyspnea by Harlan Stains, MD  Subjective:   PATIENT ID: [name] [name] GENDER: female DOB: 07-05-1955, MRN: [id number]  Chief Complaint  Patient presents with   Follow-up    Breathing has improved some. She has occ nasal congestion.    65yF with history of AR, psoriasis/psoriatic arthritis - was on cosyntex (secukinumab, last dose 3 months ago), RLS, GERD. Finished prednisone pack for arthritis 2 weeks ago.   She saw her PCP and she reported breathing spells where she's suddenly dyspneic. Usually when she's out doing something. It's very abrupt when it starts. Always happens when she's exerting herself. It does get better with stopping to rest. Normal stress test in 2015 but she wasn't having these same symptoms back then that she can recall. Occasional chest tightness/pressure sensation. She does think these breathing episodes were improved substantially by course of prednisone. She does have reflux and hiatal hernia but protonix helps. Hasn't tried inhaler when she has one of these attacks.   No family history of lung disease.   Never smoked. Did have some secondhand exposure.   Interval HPI: Last seen by me 03/28/21 at which point planned nuke stress test. Performed on 04/15/21, low risk.  She is no longer having similar episodic dyspnea.   Has had PSG in past maybe 5-10 years ago with mild OSA. She thinks she weighs maybe 20 lb more since then. She does have excessive daytime sleepiness. No PND. She snores loudly. No morning headaches.   Otherwise pertinent review of systems is negative.  Past Medical History:  Diagnosis Date   Allergic rhinitis    Arthritis    Depression    Factor V Leiden carrier ([place]) 08/26/2011   Fatty liver    Fibroids    dysfuction uterine bleeding   GERD (gastroesophageal reflux disease)    Hypertension    Osteoarthritis    Peripheral neuropathy    Psoriatic arthritis (HCC)    Restless leg syndrome       Family History  Problem Relation Age of Onset   Coronary artery disease Father 51       deceased- TIAs   Stroke Father    Hypertension Mother 28       alive   Deep vein thrombosis Mother    Neuropathy Mother    Osteoporosis Mother    Liver disease Sister    Other Sister        ITP   Cancer Sister        breast   Heart failure Maternal Grandmother      Past Surgical History:  Procedure Laterality Date   CHOLECYSTECTOMY     KNEE SURGERY     right TKR   LUMBAR SPINE SURGERY     TUBAL LIGATION     bilateral   URETHRAL SLING     VAGINAL HYSTERECTOMY      Social History   Socioeconomic History   Marital status: Widowed    Spouse name: Not on file   Number of children: 2   Years of education: Not on file   Highest education level: Not on file  Occupational History   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  Is retired, has 2 yr college degree.  Has 2 children.  Drinks caffeine.     Social Determinants of Health   Financial Resource Strain: Not on file  Food Insecurity: Not on file  Transportation Needs: Not on file  Physical Activity: Not on file  Stress: Not on file  Social Connections: Not on file  Intimate Partner Violence: Not on file     Allergies  Allergen Reactions   Codeine Nausea Only   Diclofenac Sodium Other (See Comments)    Worsens RLS   Flonase [Fluticasone] Itching     Outpatient Medications Prior to Visit  Medication Sig Dispense Refill   acetaminophen (TYLENOL) 325 MG tablet Take 650 mg by mouth every 6 (six) hours as needed for mild pain.     albuterol (VENTOLIN HFA) 108 (90  Base) MCG/ACT inhaler Inhale 2 puffs into the lungs every 6 (six) hours as needed for shortness of breath. 18 g 11   azelastine (ASTELIN) 0.1 % nasal spray Place 1 spray into both nostrils 2 (two) times daily. Use in each nostril as directed 30 mL 5   cetirizine (ZYRTEC) 10 MG tablet Take 10 mg by mouth as needed for allergies.     cholecalciferol (VITAMIN D3) 25 MCG (1000 UNIT) tablet Take 1,000 Units by mouth daily.     losartan-hydrochlorothiazide (HYZAAR) 50-12.5 MG tablet Take 1 tablet by mouth daily.      MYRBETRIQ 50 MG TB24 tablet Take 50 mg by mouth daily.      pantoprazole (PROTONIX) 40 MG tablet Take 40 mg by mouth 2 (two) times daily.     potassium chloride SA (K-DUR) 20 MEQ tablet Take 1 tablet (20 mEq total) by mouth daily. Take one tablet twice a day for five days, then one tablet once a day (Patient taking differently: Take 20 mEq by mouth daily.) 35 tablet 0   pregabalin (LYRICA) 150 MG capsule Take 150 mg by mouth 2 (two) times daily.     pregabalin (LYRICA) 75 MG capsule Take 75 mg by mouth at bedtime. In addition to the 150 mg bid     rOPINIRole (REQUIP) 2 MG tablet Take 1 tablet (2 mg total) by mouth at bedtime. 90 tablet 4   Secukinumab (COSENTYX [place]) Inject into the skin.     traMADol (ULTRAM) 50 MG tablet Take 1-2 tablets (50-100 mg total) by mouth every 6 (six) hours as needed. 60 tablet 0   methocarbamol (ROBAXIN) 500 MG tablet Take 1 tablet (500 mg total) by mouth 2 (two) times daily as needed. 20 tablet 0   ALPRAZolam (XANAX) 0.25 MG tablet      DULoxetine (CYMBALTA) 60 MG capsule Take 1 capsule (60 mg total) by mouth 2 (two) times daily. 180 capsule 4   HYDROcodone-acetaminophen (NORCO/VICODIN) 5-325 MG tablet Take 1 tablet by mouth every 4 (four) hours as needed. 8 tablet 0   meloxicam (MOBIC) 7.5 MG tablet Take 1 tablet (7.5 mg total) by mouth 2 (two) times daily as needed for pain. 30 tablet 2   No facility-administered medications prior to visit.        Objective:   Physical Exam:  General appearance: 66 y.o., female, NAD, conversant  Eyes: anicteric sclerae; PERRL, tracking appropriately HENT: NCAT; MMM Neck: Trachea midline; no lymphadenopathy, no JVD Lungs: CTAB, no crackles, no wheeze, with  normal respiratory effort CV: RRR, no murmur  Abdomen: Soft, non-tender; non-distended, BS present  Extremities: No peripheral edema, warm Skin: Normal turgor and texture; no rash Psych: Appropriate affect Neuro: Alert and oriented to person and place, no focal deficit     Vitals:   05/13/21 1022  BP: 126/70  Pulse: (!) 58  Temp: 97.9 F (36.6 C)  TempSrc: Oral  SpO2: 99%  Weight: 258 lb (117 kg)  Height: 5\' 4"  (1.626 m)    99% on RA BMI Readings from Last 3 Encounters:  05/13/21 44.29 kg/m  04/14/21 44.97 kg/m  03/28/21 44.97 kg/m   Wt Readings from Last 3 Encounters:  05/13/21 258 lb (117 kg)  04/14/21 262 lb (118.8 kg)  03/28/21 262 lb (118.8 kg)     CBC    Component Value Date/Time   WBC 7.2 02/18/2021 2000   RBC 4.78 02/18/2021 2000   HGB 12.9 02/18/2021 2000   HGB 13.2 08/26/2011 1239   HCT 40.5 02/18/2021 2000   HCT 39.8 08/26/2011 1239   PLT 215 02/18/2021 2000   PLT 195 08/26/2011 1239   MCV 84.7 02/18/2021 2000   MCV 88.3 08/26/2011 1239   MCH 27.0 02/18/2021 2000   MCHC 31.9 02/18/2021 2000   RDW 15.8 (H) 02/18/2021 2000   RDW 15.7 (H) 08/26/2011 1239   LYMPHSABS 2.0 02/18/2021 2000   LYMPHSABS 1.6 08/26/2011 1239   MONOABS 0.5 02/18/2021 2000   MONOABS 0.4 08/26/2011 1239   EOSABS 0.1 02/18/2021 2000   EOSABS 0.1 08/26/2011 1239   BASOSABS 0.1 02/18/2021 2000   BASOSABS 0.0 08/26/2011 1239    Chest Imaging: CT A/P 11/22 reviewed by me lung bases unremarkable seemingly with interval resolution of previously noted small nodules  Pulmonary Functions Testing Results: PFT Results Latest Ref Rng & Units 08/10/2019  FVC-Pre L 2.05  FVC-Predicted Pre % 69  FVC-Post L 2.09   FVC-Predicted Post % 70  Pre FEV1/FVC % % 88  Post FEV1/FCV % % 92  FEV1-Pre L 1.80  FEV1-Predicted Pre % 80  FEV1-Post L 1.91  DLCO uncorrected ml/min/mmHg 15.09  DLCO UNC% % 80  DLVA Predicted % 106  TLC L 3.61  TLC % Predicted % 74  RV % Predicted % 70   Reviewed by me mild restriction, no BD effect, normal DLCO, FV loops with a little bit of expiratory flattening   Echocardiogram:  TTE with G1DD  04/15/21 nuke stress reviewed by me low risk study, normal    Assessment & Plan:   # episodic exertional dyspnea: Improved since last visit, normal nuke stress.  # snoring # excessive daytime sleepiness  Plan: - can continue albuterol inhaler prn - home sleep study - if episodic exertional dyspnea recurs then consideration of methacholine challenge vs CPEX depending on nature of dyspnea she describes in clinic   RTC 4 months   Maryjane Hurter, MD [facility] 05/13/2021 10:47 AM

## 2021-05-13 ENCOUNTER — Ambulatory Visit: Payer: Medicare PPO | Admitting: Orthopaedic Surgery

## 2021-05-13 ENCOUNTER — Encounter: Payer: Self-pay | Admitting: Student

## 2021-05-13 ENCOUNTER — Encounter: Payer: Self-pay | Admitting: Orthopaedic Surgery

## 2021-05-13 ENCOUNTER — Other Ambulatory Visit: Payer: Self-pay

## 2021-05-13 ENCOUNTER — Ambulatory Visit: Payer: Medicare PPO | Admitting: Student

## 2021-05-13 VITALS — BP 126/70 | HR 58 | Temp 97.9°F | Ht 64.0 in | Wt 258.0 lb

## 2021-05-13 VITALS — Ht 64.0 in | Wt 258.0 lb

## 2021-05-13 DIAGNOSIS — M545 Low back pain, unspecified: Secondary | ICD-10-CM | POA: Diagnosis not present

## 2021-05-13 DIAGNOSIS — R0609 Other forms of dyspnea: Secondary | ICD-10-CM | POA: Diagnosis not present

## 2021-05-13 DIAGNOSIS — G4733 Obstructive sleep apnea (adult) (pediatric): Secondary | ICD-10-CM

## 2021-05-13 MED ORDER — TRAMADOL HCL 50 MG PO TABS
50.0000 mg | ORAL_TABLET | Freq: Three times a day (TID) | ORAL | 0 refills | Status: DC | PRN
Start: 2021-05-13 — End: 2022-07-20

## 2021-05-13 NOTE — Patient Instructions (Signed)
-   You will be called to schedule your home sleep study. If you have questions about your results or if you start to notice worsening shortness of breath with exertion then call or send message through [facility]

## 2021-05-13 NOTE — Progress Notes (Signed)
Office Visit Note   Patient: [name] [name]           Date of Birth: 03-18-1956           MRN: [id number] Visit Date: 05/13/2021              Requested by: Harlan Stains, MD [place],  [place] [address] PCP: Harlan Stains, MD   Assessment & Plan: Visit Diagnoses:  1. Low back pain, unspecified back pain laterality, unspecified chronicity, unspecified whether sciatica present     Plan: Impression is chronic low back pain with left lower extremity radiculopathy.  Her most recent MRI of the lumbar spine was from 2016 but did show multilevel disc and facet degeneration.  She has not seen improvement with physical therapy so I would like to refer her to Dr. Ernestina Patches for epidural steroid injection versus facet block.  She will follow-up with [place] as needed.  Follow-Up Instructions: Return if symptoms worsen or fail to improve.   Orders:  Orders Placed This Encounter  Procedures   Ambulatory referral to Physical Medicine Rehab   Meds ordered this encounter  Medications   traMADol (ULTRAM) 50 MG tablet    Sig: Take 1 tablet (50 mg total) by mouth 3 (three) times daily as needed.    Dispense:  30 tablet    Refill:  0      Procedures: No procedures performed   Clinical Data: No additional findings.   Subjective: Chief Complaint  Patient presents with   Lower Back - Pain    HPI patient is a pleasant 66 year old female who comes in today with chronic low back pain.  The pain she has is actually to both buttocks left greater than right and radiates down the back of the left leg to the knee.  Symptoms are worse with walking.  She does get some relief with rest.  She has been taking Advil without significant relief.  She does have burning sensation to the buttocks.  She has been in physical therapy without any relief.  She did undergo a lumbar spine surgery by Dr. Ronnald Ramp back in the late 1990s but has not had an epidural steroid injection since.  She  currently denies any bowel or bladder change or saddle paresthesias.  Review of Systems as detailed in HPI.  All others reviewed and are negative.   Objective: Vital Signs: Ht 5\' 4"  (1.626 m)    Wt 258 lb (117 kg)    BMI 44.29 kg/m   Physical Exam well-developed well-nourished female no acute distress.  Alert and oriented x3.  Ortho Exam lumbar spine exam shows no spinous or paraspinous tenderness.  She does have slight increased pain with lumbar flexion and extension.  Negative straight leg raise both sides.  Negative logroll.  She is neurovascular intact distally.  Specialty Comments:  No specialty comments available.  Imaging: No new imaging   PMFS History: Patient Active Problem List   Diagnosis Date Noted   Pulmonary hypertension, unspecified ([place]) 05/25/2019   Educated about COVID-19 virus infection 05/24/2019   Obstructive hypertrophic cardiomyopathy ([facility]) 05/24/2019   OAB (overactive bladder) 09/29/2017   OSA (obstructive sleep apnea) 09/29/2017   Urge urinary incontinence 09/29/2017   Fibromyalgia 11/11/2016   Atrial septal hypertrophy 10/21/2015   Paresthesia of both feet 07/31/2015   Bradycardia 08/14/2014   Obesity 10/09/2013   Chest pain 10/08/2013   Factor V Leiden carrier ([place]) 08/26/2011   Protein S deficiency ([facility]) 08/26/2011  MORBID OBESITY 06/06/2010   DEPRESSION 06/06/2010   RESTLESS LEG SYNDROME 06/06/2010   Essential hypertension 06/06/2010   ALLERGIC RHINITIS 06/06/2010   GERD 06/06/2010   FATTY LIVER DISEASE 06/06/2010   OSTEOARTHRITIS 06/06/2010   MYOFASCIAL PAIN SYNDROME 06/06/2010   EDEMA 06/06/2010   DYSPNEA 06/06/2010   CHEST PAIN UNSPECIFIED 06/06/2010   Past Medical History:  Diagnosis Date   Allergic rhinitis    Arthritis    Depression    Factor V Leiden carrier ([facility] [place]) 08/26/2011   Fatty liver    Fibroids    dysfuction uterine bleeding   GERD (gastroesophageal reflux disease)    Hypertension    Osteoarthritis    Peripheral  neuropathy    Psoriatic arthritis ([facility])    Restless leg syndrome     Family History  Problem Relation Age of Onset   Coronary artery disease Father 76       deceased- TIAs   Stroke Father    Hypertension Mother 54       alive   Deep vein thrombosis Mother    Neuropathy Mother    Osteoporosis Mother    Liver disease Sister    Other Sister        ITP   Cancer Sister        breast   Heart failure Maternal Grandmother     Past Surgical History:  Procedure Laterality Date   CHOLECYSTECTOMY     KNEE SURGERY     right TKR   LUMBAR SPINE SURGERY     TUBAL LIGATION     bilateral   URETHRAL SLING     VAGINAL HYSTERECTOMY     Social History   Occupational History   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

## 2021-05-15 ENCOUNTER — Telehealth: Payer: Self-pay | Admitting: Physical Medicine and Rehabilitation

## 2021-05-15 NOTE — Telephone Encounter (Signed)
Patient called. Returning a call to [name] [name] 

## 2021-05-19 DIAGNOSIS — R5382 Chronic fatigue, unspecified: Secondary | ICD-10-CM | POA: Diagnosis not present

## 2021-05-19 DIAGNOSIS — N183 Chronic kidney disease, stage 3 unspecified: Secondary | ICD-10-CM | POA: Diagnosis not present

## 2021-05-19 DIAGNOSIS — F419 Anxiety disorder, unspecified: Secondary | ICD-10-CM | POA: Diagnosis not present

## 2021-05-19 DIAGNOSIS — I700 Atherosclerosis of aorta: Secondary | ICD-10-CM | POA: Diagnosis not present

## 2021-05-19 DIAGNOSIS — G2581 Restless legs syndrome: Secondary | ICD-10-CM | POA: Diagnosis not present

## 2021-05-19 DIAGNOSIS — Z0000 Encounter for general adult medical examination without abnormal findings: Secondary | ICD-10-CM | POA: Diagnosis not present

## 2021-05-19 DIAGNOSIS — I129 Hypertensive chronic kidney disease with stage 1 through stage 4 chronic kidney disease, or unspecified chronic kidney disease: Secondary | ICD-10-CM | POA: Diagnosis not present

## 2021-05-19 DIAGNOSIS — E538 Deficiency of other specified B group vitamins: Secondary | ICD-10-CM | POA: Diagnosis not present

## 2021-05-19 DIAGNOSIS — F324 Major depressive disorder, single episode, in partial remission: Secondary | ICD-10-CM | POA: Diagnosis not present

## 2021-05-26 DIAGNOSIS — R159 Full incontinence of feces: Secondary | ICD-10-CM | POA: Diagnosis not present

## 2021-05-26 DIAGNOSIS — K642 Third degree hemorrhoids: Secondary | ICD-10-CM | POA: Diagnosis not present

## 2021-06-02 ENCOUNTER — Ambulatory Visit: Payer: Self-pay

## 2021-06-02 ENCOUNTER — Ambulatory Visit: Payer: Medicare PPO | Admitting: Physical Medicine and Rehabilitation

## 2021-06-02 ENCOUNTER — Other Ambulatory Visit: Payer: Self-pay

## 2021-06-02 ENCOUNTER — Encounter: Payer: Self-pay | Admitting: Physical Medicine and Rehabilitation

## 2021-06-02 VITALS — BP 179/81 | HR 83

## 2021-06-02 DIAGNOSIS — M47816 Spondylosis without myelopathy or radiculopathy, lumbar region: Secondary | ICD-10-CM

## 2021-06-02 MED ORDER — METHYLPREDNISOLONE ACETATE 80 MG/ML IJ SUSP: 80.0000 mg | Freq: Once | INTRAMUSCULAR | Status: DC

## 2021-06-02 NOTE — Progress Notes (Signed)
[name] [name] - 66 y.o. female MRN [id number]  Date of birth: 15-Mar-1956  Office Visit Note: Visit Date: 06/02/2021 PCP: Laurann Montana, MD Referred by: Laurann Montana, MD  Subjective: Chief Complaint  Patient presents with   Lower Back - Pain   Left Leg - Pain   HPI:  [name] [name] is a 66 y.o. female who comes in today at the request of Dr. Glee Arvin for planned Bilateral  L5-S1 Lumbar facet/medial branch block with fluoroscopic guidance.  The patient has failed conservative care including home exercise, medications, time and activity modification.  This injection will be diagnostic and hopefully therapeutic.  Please see requesting physician notes for further details and justification.  Exam has shown concordant pain with facet joint loading.  She has some history of prior lumbar surgery although MRI from 2016 does not really indicate much in the way of a big surgical procedure done of the spine at that point.  Depending on relief today would look at updating MRI of the lumbar spine.  She has had good course of physical therapy and medication management without relief.  ROS Otherwise per HPI.  Assessment & Plan: Visit Diagnoses:    ICD-10-CM   1. Spondylosis without myelopathy or radiculopathy, lumbar region  M47.816 XR C-ARM NO REPORT    Facet Injection    DISCONTINUED: methylPREDNISolone acetate (DEPO-MEDROL) injection 80 mg      Plan: No additional findings.   Meds & Orders:  Meds ordered this encounter  Medications   DISCONTD: methylPREDNISolone acetate (DEPO-MEDROL) injection 80 mg    Orders Placed This Encounter  Procedures   Facet Injection   XR C-ARM NO REPORT    Follow-up: Return in about 3 weeks (around 06/23/2021), or if symptoms worsen or fail to improve.   Procedures: No procedures performed  Lumbar Diagnostic Facet Joint Nerve Block with Fluoroscopic Guidance   Patient: [name] [name]      Date of Birth: 06/29/1955 MRN: [id number] PCP: Laurann Montana, MD      Visit Date: 06/02/2021   Universal Protocol:    Date/Time: 06/03/2310:47 PM  Consent Given By: the patient  Position: PRONE  Additional Comments: Vital signs were monitored before and after the procedure. Patient was prepped and draped in the usual sterile fashion. The correct patient, procedure, and site was verified.   Injection Procedure Details:   Procedure diagnoses:  1. Spondylosis without myelopathy or radiculopathy, lumbar region      Meds Administered:  Meds ordered this encounter  Medications   methylPREDNISolone acetate (DEPO-MEDROL) injection 80 mg     Laterality: Bilateral  Location/Site: L5-S1, L4 medial branch and L5 dorsal ramus  Needle: 5.0 in., 25 ga.  Short bevel or Quincke spinal needle  Needle Placement: Oblique pedical  Findings:   -Comments: There was excellent flow of contrast along the articular pillars without intravascular flow.  Procedure Details: The fluoroscope beam is vertically oriented in AP and then obliqued 15 to 20 degrees to the ipsilateral side of the desired nerve to achieve the Scotty dog appearance.  The skin over the target area of the junction of the superior articulating process and the transverse process (sacral ala if blocking the L5 dorsal rami) was locally anesthetized with a 1 ml volume of 1% Lidocaine without Epinephrine.  The spinal needle was inserted and advanced in a trajectory view down to the target.   After contact with periosteum and negative aspirate for blood and CSF, correct placement without intravascular or epidural  spread was confirmed by injecting 0.5 ml. of Isovue-250.  A spot radiograph was obtained of this image.    Next, a 0.5 ml. volume of the injectate described above was injected. The needle was then redirected to the other facet joint nerves mentioned above if needed.  Prior to the procedure, the patient was given a Pain Diary which was completed for baseline measurements.  After  the procedure, the patient rated their pain every 30 minutes and will continue rating at this frequency for a total of 5 hours.  The patient has been asked to complete the Diary and return to [place] by mail, fax or hand delivered as soon as possible.   Additional Comments:  The patient tolerated the procedure well Dressing: 2 x 2 sterile gauze and Band-Aid    Post-procedure details: Patient was observed during the procedure. Post-procedure instructions were reviewed.  Patient left the clinic in stable condition.    Clinical History: MRI LUMBAR SPINE WITHOUT AND WITH CONTRAST   TECHNIQUE: Multiplanar and multiecho pulse sequences of the lumbar spine were obtained without and with intravenous contrast.   CONTRAST:  MultiHance 20 mL.   COMPARISON:  MRI thoracic spine reported separately. MRI lumbar spine most recent 10/27/2012. CT abdomen and pelvis 03/06/2013.   FINDINGS: Segmentation: Normal.   Alignment: Grade 1 anterolisthesis L4 on L5 measuring 2 mm. Trace retrolisthesis L2-3 and L1-2 is compensatory.   Vertebrae: No worrisome osseous lesion.Abnormal enhancement related to L3-4 facet arthropathy on the RIGHT.   Conus medullaris: Normal in size, signal, and location.   Paraspinal tissues: No evidence for hydronephrosis or paravertebral mass. Renal cystic disease better demonstrated on prior CT abdomen.   Disc levels:   L1-L2: Trace retrolisthesis and mild annular bulging. Mild facet arthropathy. No impingement.   L2-L3: Trace retrolisthesis and mild annular bulging. Mild facet arthropathy. No impingement.   L3-L4: Mild annular bulging. Advanced facet arthropathy and ligamentum flavum hypertrophy. Joint effusion on the RIGHT with bone marrow edema. Mild central canal stenosis. Mild foraminal narrowing. No definite L3 or L4 nerve root impingement.   L4-L5: 2 mm anterolisthesis. Advanced facet arthropathy. Ligamentum flavum hypertrophy on the LEFT. LEFT-sided joint  effusion. No subarticular zone or foraminal zone narrowing. Question postsurgical change on the RIGHT.   L5-S1: Mild bulge. BILATERAL facet arthropathy and ligamentum flavum hypertrophy, RIGHT worse than LEFT. BILATERAL facet joint effusions, also worse on the RIGHT. No subarticular zone or foraminal zone narrowing.   IMPRESSION: Multilevel spondylosis as described, similar to prior MR 2014. No dominant compressive lesion is evident.     Electronically Signed   By: Elsie Stain M.D.   On: 01/24/2015 14:04     Objective:  VS:  HT:    WT:   BMI:     BP:(!) 179/81  HR:83bpm  TEMP: ( )  RESP:  Physical Exam Vitals and nursing note reviewed.  Constitutional:      General: She is not in acute distress.    Appearance: Normal appearance. She is obese. She is not ill-appearing.  HENT:     Head: Normocephalic and atraumatic.     Right Ear: External ear normal.     Left Ear: External ear normal.  Eyes:     Extraocular Movements: Extraocular movements intact.  Cardiovascular:     Rate and Rhythm: Normal rate.     Pulses: Normal pulses.  Pulmonary:     Effort: Pulmonary effort is normal. No respiratory distress.  Abdominal:     General: There is  no distension.     Palpations: Abdomen is soft.  Musculoskeletal:        General: Tenderness present.     Cervical back: Neck supple.     Right lower leg: No edema.     Left lower leg: No edema.     Comments: Patient has good distal strength with no pain over the greater trochanters.  No clonus or focal weakness. Patient somewhat slow to rise from a seated position to full extension.  There is concordant low back pain with facet loading and lumbar spine extension rotation.  There are no definitive trigger points but the patient is somewhat tender across the lower back and PSIS.  There is no pain with hip rotation.   Skin:    Findings: No erythema, lesion or rash.  Neurological:     General: No focal deficit present.     Mental  Status: She is alert and oriented to person, place, and time.     Sensory: No sensory deficit.     Motor: No weakness or abnormal muscle tone.     Coordination: Coordination normal.  Psychiatric:        Mood and Affect: Mood normal.        Behavior: Behavior normal.     Imaging: XR C-ARM NO REPORT  Result Date: 06/02/2021 Please see Notes tab for imaging impression.

## 2021-06-02 NOTE — Progress Notes (Signed)
Pt state lower back pain that travels down her left leg at times. Pt state cleaning her house and bending makes the pain worse. Pt state she takes over the counter pain meds and pain cream to help ease her pain. ? ?Numeric Pain Rating Scale and Functional Assessment ?Average Pain 5 ? ? ?In the last MONTH (on 0-10 scale) has pain interfered with the following? ? ?1. General activity like being  able to carry out your everyday physical activities such as walking, climbing stairs, carrying groceries, or moving a chair?  ?Rating(10) ? ? ?+Driver, -BT, -Dye Allergies. ? ?

## 2021-06-02 NOTE — Procedures (Signed)
Lumbar Diagnostic Facet Joint Nerve Block with Fluoroscopic Guidance  ? ?Patient: [name] [name]      ?Date of Birth: March 14, 1956 ?MRN: [id number] ?PCP: Harlan Stains, MD      ?Visit Date: 06/02/2021 ?  ?Universal Protocol:    ?Date/Time: 06/03/2310:47 PM ? ?Consent Given By: the patient ? ?Position: PRONE ? ?Additional Comments: ?Vital signs were monitored before and after the procedure. ?Patient was prepped and draped in the usual sterile fashion. ?The correct patient, procedure, and site was verified. ? ? ?Injection Procedure Details:  ? ?Procedure diagnoses:  ?1. Spondylosis without myelopathy or radiculopathy, lumbar region   ?  ? ?Meds Administered:  ?Meds ordered this encounter  ?Medications  ? methylPREDNISolone acetate (DEPO-MEDROL) injection 80 mg  ?  ? ?Laterality: Bilateral ? ?Location/Site: L5-S1, L4 medial branch and L5 dorsal ramus ? ?Needle: 5.0 in., 25 ga.  Short bevel or Quincke spinal needle ? ?Needle Placement: Oblique pedical ? ?Findings: ? ? -Comments: There was excellent flow of contrast along the articular pillars without intravascular flow. ? ?Procedure Details: ?The fluoroscope beam is vertically oriented in AP and then obliqued 15 to 20 degrees to the ipsilateral side of the desired nerve to achieve the ?Scotty dog? appearance.  The skin over the target area of the junction of the superior articulating process and the transverse process (sacral ala if blocking the L5 dorsal rami) was locally anesthetized with a 1 ml volume of 1% Lidocaine without Epinephrine.  The spinal needle was inserted and advanced in a trajectory view down to the target.  ? ?After contact with periosteum and negative aspirate for blood and CSF, correct placement without intravascular or epidural spread was confirmed by injecting 0.5 ml. of Isovue-250.  A spot radiograph was obtained of this image.   ? ?Next, a 0.5 ml. volume of the injectate described above was injected. The needle was then redirected to the other  facet joint nerves mentioned above if needed. ? ?Prior to the procedure, the patient was given a Pain Diary which was completed for baseline measurements.  After the procedure, the patient rated their pain every 30 minutes and will continue rating at this frequency for a total of 5 hours.  The patient has been asked to complete the Diary and return to [place] by mail, fax or hand delivered as soon as possible. ? ? ?Additional Comments:  ?The patient tolerated the procedure well ?Dressing: 2 x 2 sterile gauze and Band-Aid ?  ? ?Post-procedure details: ?Patient was observed during the procedure. ?Post-procedure instructions were reviewed. ? ?Patient left the clinic in stable condition.  ?

## 2021-06-02 NOTE — Patient Instructions (Signed)

## 2021-06-12 DIAGNOSIS — F419 Anxiety disorder, unspecified: Secondary | ICD-10-CM | POA: Diagnosis not present

## 2021-06-12 DIAGNOSIS — G629 Polyneuropathy, unspecified: Secondary | ICD-10-CM | POA: Diagnosis not present

## 2021-06-12 DIAGNOSIS — I10 Essential (primary) hypertension: Secondary | ICD-10-CM | POA: Diagnosis not present

## 2021-06-12 DIAGNOSIS — F324 Major depressive disorder, single episode, in partial remission: Secondary | ICD-10-CM | POA: Diagnosis not present

## 2021-06-12 DIAGNOSIS — G2581 Restless legs syndrome: Secondary | ICD-10-CM | POA: Diagnosis not present

## 2021-06-12 DIAGNOSIS — L4050 Arthropathic psoriasis, unspecified: Secondary | ICD-10-CM | POA: Diagnosis not present

## 2021-06-12 DIAGNOSIS — G8929 Other chronic pain: Secondary | ICD-10-CM | POA: Diagnosis not present

## 2021-06-12 DIAGNOSIS — Z6841 Body Mass Index (BMI) 40.0 and over, adult: Secondary | ICD-10-CM | POA: Diagnosis not present

## 2021-06-17 DIAGNOSIS — N3281 Overactive bladder: Secondary | ICD-10-CM | POA: Diagnosis not present

## 2021-06-17 DIAGNOSIS — N3941 Urge incontinence: Secondary | ICD-10-CM | POA: Diagnosis not present

## 2021-07-09 DIAGNOSIS — G8929 Other chronic pain: Secondary | ICD-10-CM | POA: Diagnosis not present

## 2021-07-09 DIAGNOSIS — R5382 Chronic fatigue, unspecified: Secondary | ICD-10-CM | POA: Diagnosis not present

## 2021-07-09 DIAGNOSIS — Z6841 Body Mass Index (BMI) 40.0 and over, adult: Secondary | ICD-10-CM | POA: Diagnosis not present

## 2021-07-09 DIAGNOSIS — M65332 Trigger finger, left middle finger: Secondary | ICD-10-CM | POA: Diagnosis not present

## 2021-07-09 DIAGNOSIS — M7918 Myalgia, other site: Secondary | ICD-10-CM | POA: Diagnosis not present

## 2021-07-09 DIAGNOSIS — L4050 Arthropathic psoriasis, unspecified: Secondary | ICD-10-CM | POA: Diagnosis not present

## 2021-07-09 DIAGNOSIS — Z79899 Other long term (current) drug therapy: Secondary | ICD-10-CM | POA: Diagnosis not present

## 2021-07-09 DIAGNOSIS — M1991 Primary osteoarthritis, unspecified site: Secondary | ICD-10-CM | POA: Diagnosis not present

## 2021-07-09 DIAGNOSIS — L409 Psoriasis, unspecified: Secondary | ICD-10-CM | POA: Diagnosis not present

## 2021-07-10 ENCOUNTER — Ambulatory Visit: Payer: Medicare PPO

## 2021-07-10 DIAGNOSIS — G4733 Obstructive sleep apnea (adult) (pediatric): Secondary | ICD-10-CM | POA: Diagnosis not present

## 2021-07-14 DIAGNOSIS — G4733 Obstructive sleep apnea (adult) (pediatric): Secondary | ICD-10-CM | POA: Diagnosis not present

## 2021-07-23 DIAGNOSIS — N3000 Acute cystitis without hematuria: Secondary | ICD-10-CM | POA: Diagnosis not present

## 2021-07-23 DIAGNOSIS — R350 Frequency of micturition: Secondary | ICD-10-CM | POA: Diagnosis not present

## 2021-07-31 ENCOUNTER — Telehealth: Payer: Self-pay | Admitting: Student

## 2021-07-31 DIAGNOSIS — G4733 Obstructive sleep apnea (adult) (pediatric): Secondary | ICD-10-CM

## 2021-07-31 NOTE — Telephone Encounter (Signed)
Lm for patient.  

## 2021-08-04 DIAGNOSIS — N3941 Urge incontinence: Secondary | ICD-10-CM | POA: Diagnosis not present

## 2021-08-06 ENCOUNTER — Telehealth: Payer: Self-pay

## 2021-08-06 ENCOUNTER — Ambulatory Visit (INDEPENDENT_AMBULATORY_CARE_PROVIDER_SITE_OTHER): Payer: Medicare PPO | Admitting: Orthopaedic Surgery

## 2021-08-06 ENCOUNTER — Ambulatory Visit (INDEPENDENT_AMBULATORY_CARE_PROVIDER_SITE_OTHER): Payer: Medicare PPO

## 2021-08-06 ENCOUNTER — Ambulatory Visit (HOSPITAL_COMMUNITY)
Admission: RE | Admit: 2021-08-06 | Discharge: 2021-08-06 | Disposition: A | Discharge status: Home / Self Care | Payer: Medicare PPO | Source: Ambulatory Visit | Attending: Orthopaedic Surgery | Admitting: Orthopaedic Surgery

## 2021-08-06 VITALS — Ht 64.0 in | Wt 263.4 lb

## 2021-08-06 DIAGNOSIS — M7989 Other specified soft tissue disorders: Secondary | ICD-10-CM | POA: Diagnosis not present

## 2021-08-06 DIAGNOSIS — M1712 Unilateral primary osteoarthritis, left knee: Secondary | ICD-10-CM | POA: Insufficient documentation

## 2021-08-06 MED ORDER — BUPIVACAINE HCL 0.25 % IJ SOLN
2.0000 mL | INTRAMUSCULAR | Status: AC | PRN
  Administered 2021-08-06: 2 mL via INTRA_ARTICULAR

## 2021-08-06 MED ORDER — METHYLPREDNISOLONE ACETATE 40 MG/ML IJ SUSP
40.0000 mg | INTRAMUSCULAR | Status: AC | PRN
  Administered 2021-08-06: 40 mg via INTRA_ARTICULAR

## 2021-08-06 MED ORDER — LIDOCAINE HCL 1 % IJ SOLN
2.0000 mL | INTRAMUSCULAR | Status: AC | PRN
  Administered 2021-08-06: 2 mL

## 2021-08-06 NOTE — Progress Notes (Signed)
Left lower extremity venous duplex has been completed. ?Preliminary results can be found in CV Proc through chart review.  ?Results were given to April at Dr. Phoebe Sharps office. ? ?08/06/21 1:26 PM ?Carlos Levering RVT   ?

## 2021-08-06 NOTE — Telephone Encounter (Signed)
FYI

## 2021-08-06 NOTE — Progress Notes (Signed)
? ?Office Visit Note ?  ?Patient: [name] [name]           ?Date of Birth: 19-Apr-1955           ?MRN: [id number] ?Visit Date: 08/06/2021 ?             ?Requested by: Harlan Stains, MD ?[address] ?[address] ?[place],  [place] [address] ?PCP: Harlan Stains, MD ? ? ?Assessment & Plan: ?Visit Diagnoses:  ?1. Primary osteoarthritis of left knee   ? ? ?Plan: Impression is left knee advanced degenerative joint disease as well as left calf pain and swelling.  At this point, for the knee pain, we have discussed proceeding with cortisone injection today for which she is agreeable to.  For the left calf pain and swelling and her family history, I would like to order a stat ultrasound to rule out DVT.  She will follow-up with [place] as needed. ? ?Follow-Up Instructions: Return if symptoms worsen or fail to improve.  ? ?Orders:  ?Orders Placed This Encounter  ?Procedures  ? Large Joint Inj: R knee  ? XR KNEE 3 VIEW LEFT  ? ?No orders of the defined types were placed in this encounter. ? ? ? ? Procedures: ?Large Joint Inj: R knee on 08/06/2021 9:38 AM ?Indications: pain ?Details: 22 G needle, anterolateral approach ?Medications: 2 mL lidocaine 1 %; 2 mL bupivacaine 0.25 %; 40 mg methylPREDNISolone acetate 40 MG/ML ? ? ? ? ?Clinical Data: ?No additional findings. ? ? ?Subjective: ?Chief Complaint  ?Patient presents with  ? Left Knee - Pain  ? ? ?HPI patient is a pleasant 66 year old female who comes in today with left knee and calf pain for the past 2 months which significantly worsened about 2 nights ago.  Around that time, she had been sitting at a card table for about 2 hours with her knees in a flexed position.  She notes slight improvement in symptoms over the past day or so.  The pain appears to be worse with her knee flexed.  She has been taking Advil without significant relief.  No previous injection to the left knee.  Of note, she is a factor V Leiden carrier.  She does not personally have any history of DVT or PE but  notes that her sister has had 3 DVTs, her daughter has had 1 and her brothers had 1 DVT and PE.  She is currently not on anticoagulants.  She is a non-smoker but does have history of oral contraceptive use.  She currently denies any chest pain or shortness of breath ? ?Review of Systems as detailed in HPI.  All others reviewed and are negative. ? ? ?Objective: ?Vital Signs: Ht '5\' 4"'$  (1.626 m)   Wt 263 lb 6.4 oz (119.5 kg)   BMI 45.21 kg/m?  ? ?Physical Exam well-developed well-nourished female no acute distress.  Alert and oriented x3. ? ?Ortho Exam left lower extremity exam: Left knee exam shows trace effusion.  Range of motion 0 to 95 degrees.  Medial joint line tenderness.  Left calf is moderately tender.  Moderate swelling to the left lower extremity.  No erythema or warmth.  She does not have pain with Homans testing. ? ?Specialty Comments:  ?No specialty comments available. ? ?Imaging: ?XR KNEE 3 VIEW LEFT ? ?Result Date: 08/06/2021 ?X-rays demonstrate advanced tricompartmental degenerative changes  ? ? ?PMFS History: ?Patient Active Problem List  ? Diagnosis Date Noted  ? Pulmonary hypertension, unspecified ([facility]) 05/25/2019  ? Educated  about COVID-19 virus infection 05/24/2019  ? Obstructive hypertrophic cardiomyopathy ([facility]) 05/24/2019  ? OAB (overactive bladder) 09/29/2017  ? OSA (obstructive sleep apnea) 09/29/2017  ? Urge urinary incontinence 09/29/2017  ? Fibromyalgia 11/11/2016  ? Atrial septal hypertrophy 10/21/2015  ? Paresthesia of both feet 07/31/2015  ? Bradycardia 08/14/2014  ? Obesity 10/09/2013  ? Chest pain 10/08/2013  ? Factor V Leiden carrier ([facility]) 08/26/2011  ? Protein S deficiency ([facility]) 08/26/2011  ? MORBID OBESITY 06/06/2010  ? DEPRESSION 06/06/2010  ? RESTLESS LEG SYNDROME 06/06/2010  ? Essential hypertension 06/06/2010  ? ALLERGIC RHINITIS 06/06/2010  ? GERD 06/06/2010  ? FATTY LIVER DISEASE 06/06/2010  ? OSTEOARTHRITIS 06/06/2010  ? MYOFASCIAL PAIN SYNDROME 06/06/2010  ? EDEMA  06/06/2010  ? DYSPNEA 06/06/2010  ? CHEST PAIN UNSPECIFIED 06/06/2010  ? ?Past Medical History:  ?Diagnosis Date  ? Allergic rhinitis   ? Arthritis   ? Depression   ? Factor V Leiden carrier ([place]) 08/26/2011  ? Fatty liver   ? Fibroids   ? dysfuction uterine bleeding  ? GERD (gastroesophageal reflux disease)   ? Hypertension   ? Osteoarthritis   ? Peripheral neuropathy   ? Psoriatic arthritis ([facility])   ? Restless leg syndrome   ?  ?Family History  ?Problem Relation Age of Onset  ? Coronary artery disease Father 52  ?     deceased- TIAs  ? Stroke Father   ? Hypertension Mother 51  ?     alive  ? Deep vein thrombosis Mother   ? Neuropathy Mother   ? Osteoporosis Mother   ? Liver disease Sister   ? Other Sister   ?     ITP  ? Cancer Sister   ?     breast  ? Heart failure Maternal Grandmother   ?  ?Past Surgical History:  ?Procedure Laterality Date  ? CHOLECYSTECTOMY    ? KNEE SURGERY    ? right TKR  ? LUMBAR SPINE SURGERY    ? TUBAL LIGATION    ? bilateral  ? URETHRAL SLING    ? VAGINAL HYSTERECTOMY    ? ?Social History  ? ?Occupational History  ? 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  Employer: [employer]  ?Tobacco Use  ? Smoking status: Never  ? Smokeless tobacco: Never  ?Vaping Use  ? Vaping Use: Never used  ?Substance and Sexual Activity  ? Alcohol use: No  ?  Alcohol/week: 0.0 standard drinks  ? Drug use: No  ? Sexual activity: Not on file  ? ? ? ? ? ? ?

## 2021-08-06 NOTE — Telephone Encounter (Signed)
FYI- ? ?Per Marya Amsler with [facility], patient is Negative for DVT left LE.  Please advise.  Thank you. ?

## 2021-08-07 NOTE — Telephone Encounter (Signed)
thanks

## 2021-08-12 IMAGING — CR DG TIBIA/FIBULA 2V*R*
2 series · 2 of 2 positions shown · non-contrast
Comparison: None.

CLINICAL DATA: Contusion

EXAM:
RIGHT TIBIA AND FIBULA - 2 VIEW

[x tib-fib ap right (1 of 2)]
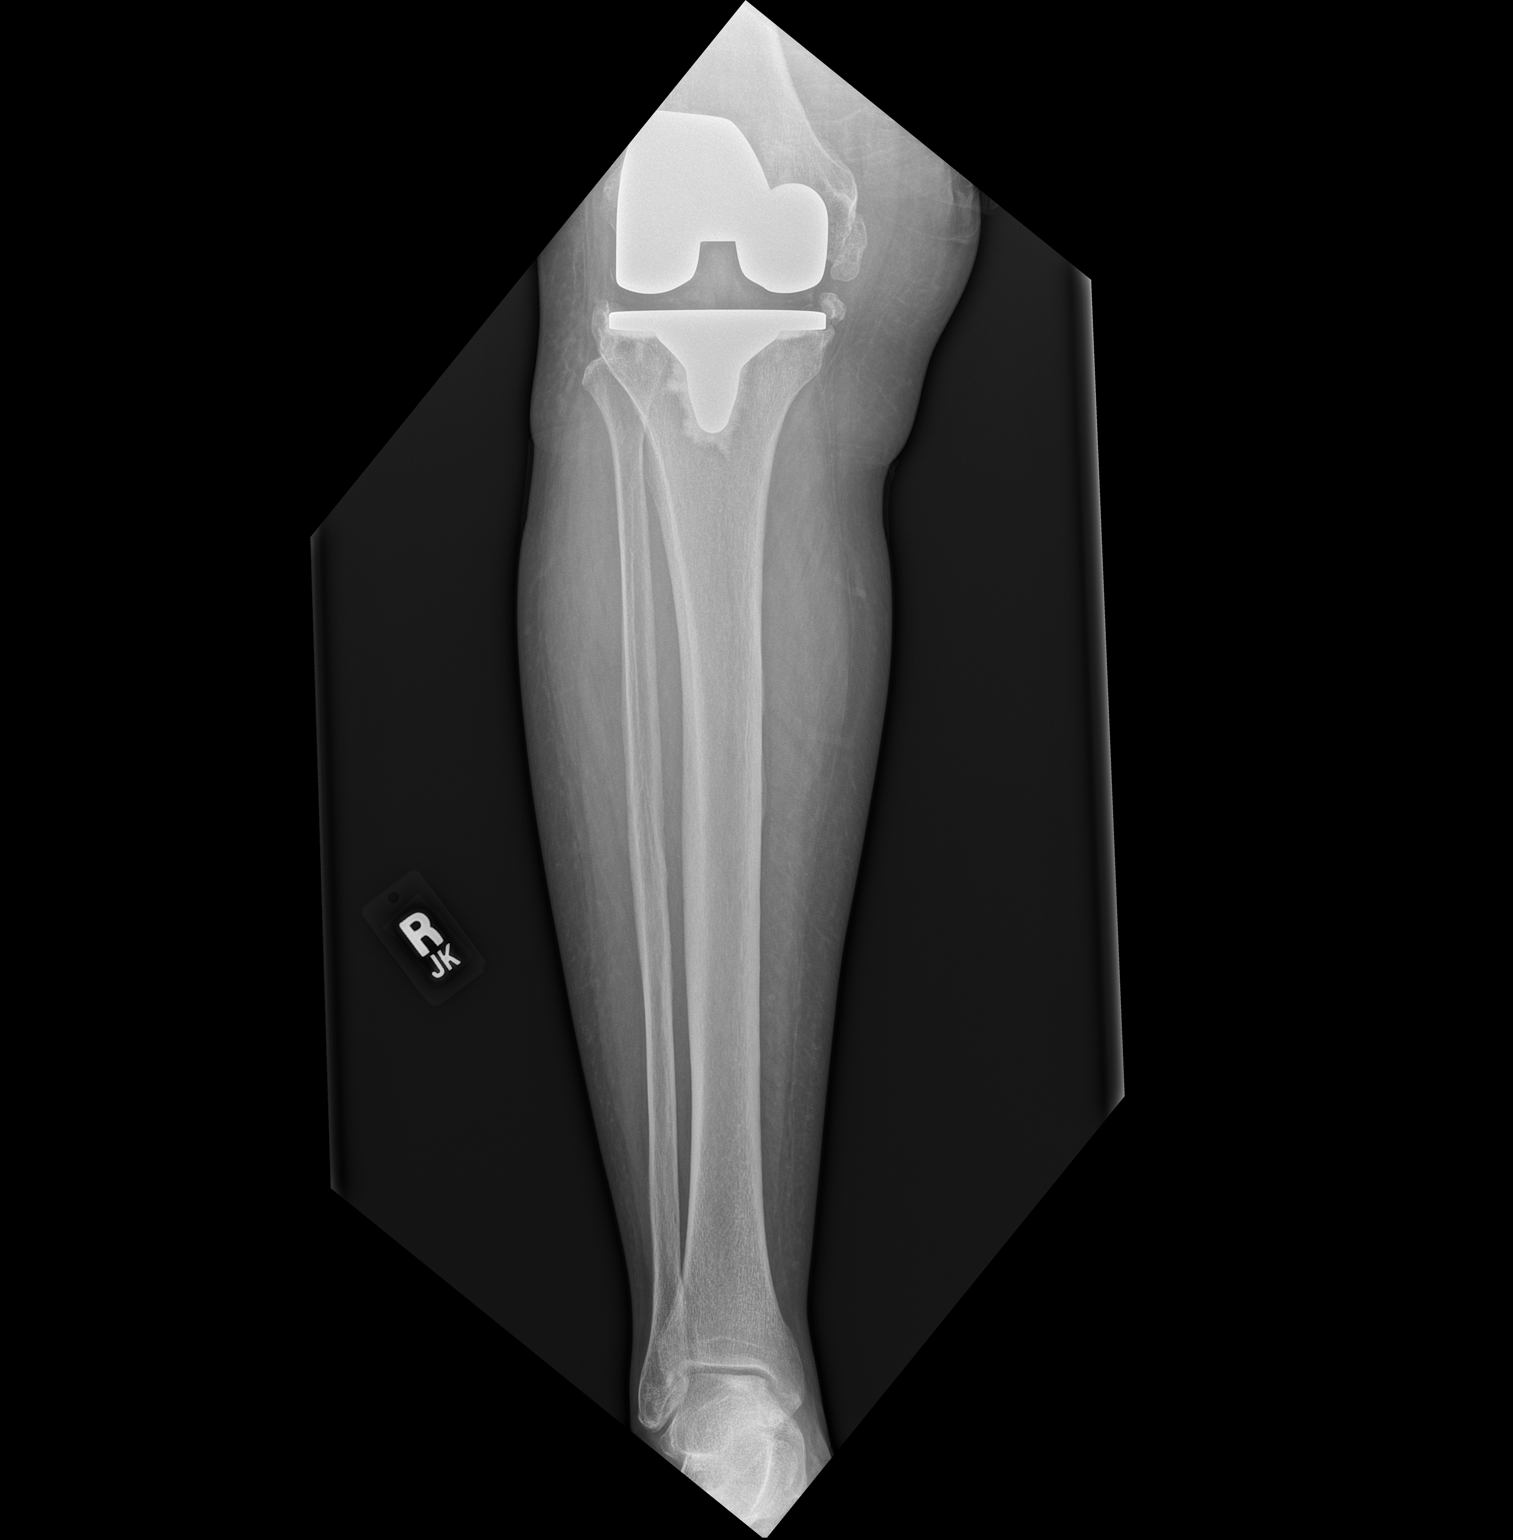

[x tib-fib ap right (2 of 2)]
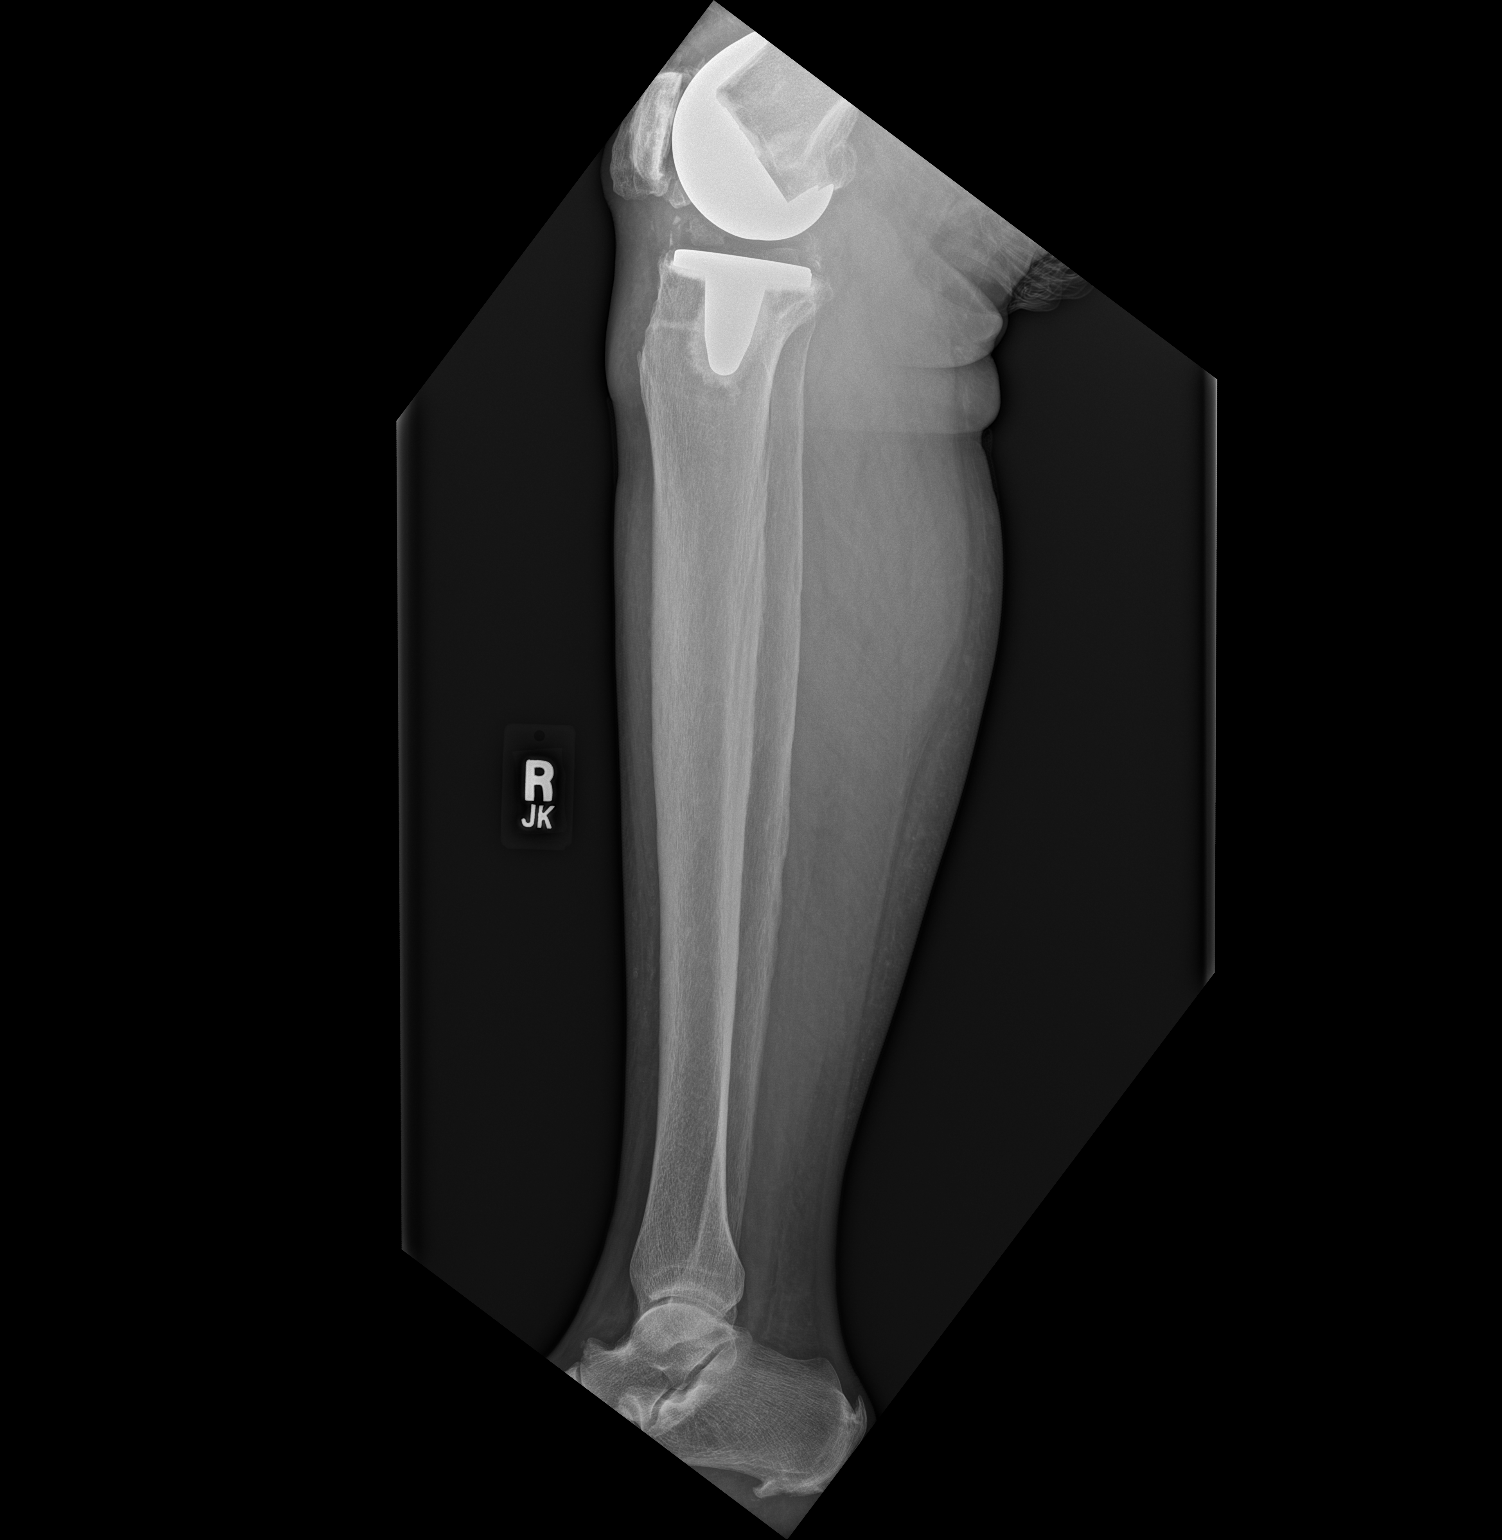

[2 of 2 positions shown; findings below may reference images not displayed]

FINDINGS: There is no evidence of fracture or other focal bone lesions. Status
post right knee total arthroplasty. Soft tissues are unremarkable.
IMPRESSION: No fracture or dislocation of the right tibia or fibula. No acute
radiographic abnormality.

## 2021-08-14 NOTE — Telephone Encounter (Signed)
Dr. Verlee Monte, please advise on the results of pt's sleep study as she has called the office requesting these results.

## 2021-08-20 DIAGNOSIS — N390 Urinary tract infection, site not specified: Secondary | ICD-10-CM | POA: Diagnosis not present

## 2021-08-20 DIAGNOSIS — I10 Essential (primary) hypertension: Secondary | ICD-10-CM | POA: Diagnosis not present

## 2021-08-20 DIAGNOSIS — R300 Dysuria: Secondary | ICD-10-CM | POA: Diagnosis not present

## 2021-08-26 DIAGNOSIS — R1011 Right upper quadrant pain: Secondary | ICD-10-CM | POA: Diagnosis not present

## 2021-08-26 DIAGNOSIS — R197 Diarrhea, unspecified: Secondary | ICD-10-CM | POA: Diagnosis not present

## 2021-09-28 ENCOUNTER — Ambulatory Visit (HOSPITAL_BASED_OUTPATIENT_CLINIC_OR_DEPARTMENT_OTHER): Payer: Medicare PPO | Attending: Student | Admitting: Pulmonary Disease

## 2021-09-28 DIAGNOSIS — G4733 Obstructive sleep apnea (adult) (pediatric): Secondary | ICD-10-CM | POA: Diagnosis not present

## 2021-09-29 DIAGNOSIS — D225 Melanocytic nevi of trunk: Secondary | ICD-10-CM | POA: Diagnosis not present

## 2021-09-29 DIAGNOSIS — L814 Other melanin hyperpigmentation: Secondary | ICD-10-CM | POA: Diagnosis not present

## 2021-09-29 DIAGNOSIS — L821 Other seborrheic keratosis: Secondary | ICD-10-CM | POA: Diagnosis not present

## 2021-10-08 DIAGNOSIS — G4733 Obstructive sleep apnea (adult) (pediatric): Secondary | ICD-10-CM

## 2021-10-08 DIAGNOSIS — R5382 Chronic fatigue, unspecified: Secondary | ICD-10-CM | POA: Diagnosis not present

## 2021-10-08 DIAGNOSIS — M79671 Pain in right foot: Secondary | ICD-10-CM | POA: Diagnosis not present

## 2021-10-08 DIAGNOSIS — M79672 Pain in left foot: Secondary | ICD-10-CM | POA: Diagnosis not present

## 2021-10-08 DIAGNOSIS — M65332 Trigger finger, left middle finger: Secondary | ICD-10-CM | POA: Diagnosis not present

## 2021-10-08 DIAGNOSIS — M7918 Myalgia, other site: Secondary | ICD-10-CM | POA: Diagnosis not present

## 2021-10-08 DIAGNOSIS — Z79899 Other long term (current) drug therapy: Secondary | ICD-10-CM | POA: Diagnosis not present

## 2021-10-08 DIAGNOSIS — M1991 Primary osteoarthritis, unspecified site: Secondary | ICD-10-CM | POA: Diagnosis not present

## 2021-10-08 DIAGNOSIS — G8929 Other chronic pain: Secondary | ICD-10-CM | POA: Diagnosis not present

## 2021-10-08 DIAGNOSIS — L4050 Arthropathic psoriasis, unspecified: Secondary | ICD-10-CM | POA: Diagnosis not present

## 2021-10-08 DIAGNOSIS — L409 Psoriasis, unspecified: Secondary | ICD-10-CM | POA: Diagnosis not present

## 2021-10-08 NOTE — Procedures (Signed)
Patient Name: [name] [name], [name] [name] Date: 09/28/2021 Gender: Female D.O.B: Aug 26, 1955 Age (years): 66 Referring Provider: Maryjane Hurter Height (inches): 64 Interpreting Physician: Kara Mead MD, ABSM Weight (lbs): 260 RPSGT: Baxter Flattery BMI: 45 MRN: [id number] Neck Size: 16.00 <br> <br> CLINICAL INFORMATION The patient is referred for a CPAP titration to treat sleep apnea.    Date of HST: 06/2021 showed severe OSA with AHI 39/h & lowest desat of 74%  SLEEP STUDY TECHNIQUE As per the AASM Manual for the Scoring of Sleep and Associated Events v2.3 (April 2016) with a hypopnea requiring 4% desaturations.  The channels recorded and monitored were frontal, central and occipital EEG, electrooculogram (EOG), submentalis EMG (chin), nasal and oral airflow, thoracic and abdominal wall motion, anterior tibialis EMG, snore microphone, electrocardiogram, and pulse oximetry. Continuous positive airway pressure (CPAP) was initiated at the beginning of the study and titrated to treat sleep-disordered breathing.  MEDICATIONS Medications self-administered by patient taken the night of the study : N/A  TECHNICIAN COMMENTS Comments added by technician: Resmed Air-Fit F20 Full Face Small Mask  RESPIRATORY PARAMETERS Optimal PAP Pressure (cm): 13 AHI at Optimal Pressure (/hr): 8.9 Overall Minimal O2 (%): 85.0 Supine % at Optimal Pressure (%): 100 Minimal O2 at Optimal Pressure (%): 88.0   SLEEP ARCHITECTURE The study was initiated at 10:48:18 PM and ended at 4:45:38 AM.  Sleep onset time was 6.9 minutes and the sleep efficiency was 86.9%%. The total sleep time was 310.5 minutes.  The patient spent 2.3%% of the night in stage N1 sleep, 82.6%% in stage N2 sleep, 0.0%% in stage N3 and 15.1% in REM.Stage REM latency was 46.5 minutes  Wake after sleep onset was 39.9. Alpha intrusion was absent. Supine sleep was 8.05%.  CARDIAC DATA The 2 lead EKG demonstrated sinus rhythm. The mean heart  rate was 55.6 beats per minute. Other EKG findings include: None.   LEG MOVEMENT DATA The total Periodic Limb Movements of Sleep (PLMS) were 0. The PLMS index was 0.0. A PLMS index of <15 is considered normal in adults.  IMPRESSIONS - The optimal PAP pressure was 13 cm of water. Few residual events were noted at this level with AHI 8/h - Moderate oxygen desaturations were observed during this titration (min O2 = 85.0%). - No snoring was audible during this study. - No cardiac abnormalities were observed during this study. - Clinically significant periodic limb movements were not noted during this study. Arousals associated with PLMs were rare.   DIAGNOSIS - Obstructive Sleep Apnea (G47.33)   RECOMMENDATIONS - Trial of auto CPAP therapy 10-15  cm H2O with a Small size Resmed Full Face AirFit F20 mask and heated humidification. - Avoid alcohol, sedatives and other CNS depressants that may worsen sleep apnea and disrupt normal sleep architecture. - Sleep hygiene should be reviewed to assess factors that may improve sleep quality. - Weight management and regular exercise should be initiated or continued. - Return to [facility] for re-evaluation after 4 weeks of therapy   Kara Mead MD Board Certified in [place]

## 2021-10-09 ENCOUNTER — Encounter: Payer: Self-pay | Admitting: Podiatry

## 2021-10-14 DIAGNOSIS — Z1231 Encounter for screening mammogram for malignant neoplasm of breast: Secondary | ICD-10-CM | POA: Diagnosis not present

## 2021-10-31 ENCOUNTER — Ambulatory Visit: Payer: Medicare PPO | Admitting: Podiatry

## 2021-11-18 DIAGNOSIS — H3562 Retinal hemorrhage, left eye: Secondary | ICD-10-CM | POA: Diagnosis not present

## 2021-11-20 DIAGNOSIS — L4050 Arthropathic psoriasis, unspecified: Secondary | ICD-10-CM | POA: Diagnosis not present

## 2021-11-20 DIAGNOSIS — G629 Polyneuropathy, unspecified: Secondary | ICD-10-CM | POA: Diagnosis not present

## 2021-11-20 DIAGNOSIS — Z79899 Other long term (current) drug therapy: Secondary | ICD-10-CM | POA: Diagnosis not present

## 2021-11-20 DIAGNOSIS — R7982 Elevated C-reactive protein (CRP): Secondary | ICD-10-CM | POA: Diagnosis not present

## 2021-11-24 DIAGNOSIS — G629 Polyneuropathy, unspecified: Secondary | ICD-10-CM | POA: Diagnosis not present

## 2021-11-24 DIAGNOSIS — I459 Conduction disorder, unspecified: Secondary | ICD-10-CM | POA: Diagnosis not present

## 2021-11-24 DIAGNOSIS — R350 Frequency of micturition: Secondary | ICD-10-CM | POA: Diagnosis not present

## 2021-11-24 DIAGNOSIS — F324 Major depressive disorder, single episode, in partial remission: Secondary | ICD-10-CM | POA: Diagnosis not present

## 2021-11-24 DIAGNOSIS — I129 Hypertensive chronic kidney disease with stage 1 through stage 4 chronic kidney disease, or unspecified chronic kidney disease: Secondary | ICD-10-CM | POA: Diagnosis not present

## 2021-11-24 DIAGNOSIS — N183 Chronic kidney disease, stage 3 unspecified: Secondary | ICD-10-CM | POA: Diagnosis not present

## 2021-11-24 DIAGNOSIS — G2581 Restless legs syndrome: Secondary | ICD-10-CM | POA: Diagnosis not present

## 2021-11-24 DIAGNOSIS — K219 Gastro-esophageal reflux disease without esophagitis: Secondary | ICD-10-CM | POA: Diagnosis not present

## 2021-11-24 DIAGNOSIS — E538 Deficiency of other specified B group vitamins: Secondary | ICD-10-CM | POA: Diagnosis not present

## 2021-11-24 DIAGNOSIS — R197 Diarrhea, unspecified: Secondary | ICD-10-CM | POA: Diagnosis not present

## 2021-11-24 DIAGNOSIS — F419 Anxiety disorder, unspecified: Secondary | ICD-10-CM | POA: Diagnosis not present

## 2021-11-26 ENCOUNTER — Ambulatory Visit: Payer: Medicare PPO | Admitting: Podiatry

## 2021-11-26 DIAGNOSIS — M79674 Pain in right toe(s): Secondary | ICD-10-CM | POA: Diagnosis not present

## 2021-11-26 DIAGNOSIS — B351 Tinea unguium: Secondary | ICD-10-CM | POA: Diagnosis not present

## 2021-11-26 DIAGNOSIS — M79675 Pain in left toe(s): Secondary | ICD-10-CM

## 2021-11-26 DIAGNOSIS — L989 Disorder of the skin and subcutaneous tissue, unspecified: Secondary | ICD-10-CM | POA: Diagnosis not present

## 2021-11-26 NOTE — Progress Notes (Signed)
   Chief Complaint  Patient presents with   foot care    Patient is here for routine foot care.    SUBJECTIVE Patient with a history of diabetes mellitus presents to office today complaining of elongated, thickened nails that cause pain while ambulating in shoes.  Patient is unable to trim their own nails.  Patient also developed hyperkeratotic preulcerative callus lesions to the bilateral feet that she would like to be evaluated and treated today as well.  Patient is here for further evaluation and treatment.   Past Medical History:  Diagnosis Date   Allergic rhinitis    Arthritis    Depression    Factor V Leiden carrier ([place]) 08/26/2011   Fatty liver    Fibroids    dysfuction uterine bleeding   GERD (gastroesophageal reflux disease)    Hypertension    Osteoarthritis    Peripheral neuropathy    Psoriatic arthritis (HCC)    Restless leg syndrome    Past Surgical History:  Procedure Laterality Date   CHOLECYSTECTOMY     KNEE SURGERY     right TKR   LUMBAR SPINE SURGERY     TUBAL LIGATION     bilateral   URETHRAL SLING     VAGINAL HYSTERECTOMY     Allergies  Allergen Reactions   Codeine Nausea Only   Diclofenac Sodium Other (See Comments)    Worsens RLS   Flonase [Fluticasone] Itching    OBJECTIVE General Patient is awake, alert, and oriented x 3 and in no acute distress. Derm Skin is dry and supple bilateral. Negative open lesions or macerations. Remaining integument unremarkable. Nails are tender, long, thickened and dystrophic with subungual debris, consistent with onychomycosis, 1-5 bilateral. No signs of infection noted.  There are some hyperkeratotic preulcerative callus tissue also noted to the bilateral forefoot.  No open wounds. Vasc  DP and PT pedal pulses palpable bilaterally. Temperature gradient within normal limits.  Neuro Epicritic and protective threshold sensation diminished bilaterally.  Musculoskeletal Exam No symptomatic pedal deformities noted  bilateral. Muscular strength within normal limits.  ASSESSMENT 1. Diabetes Mellitus w/ peripheral neuropathy 2.  Pain due to onychomycosis of toenails bilateral 3.  Hyperkeratotic preulcerative callus lesions bilateral  PLAN OF CARE 1. Patient evaluated today. 2. Instructed to maintain good pedal hygiene and foot care. Stressed importance of controlling blood sugar.  3. Mechanical debridement of nails 1-5 bilaterally performed using a nail nipper. Filed with dremel without incident.  4.  Excisional debridement of the hyperkeratotic preulcerative callus tissue was performed using a 312 scalpel without incident or bleeding as a courtesy for the patient.  Patient did feel significant relief  5.  Return to clinic in 3 mos.     Edrick Kins, DPM [facility]  Dr. Edrick Kins, DPM    [address], [place] [address]                Office [phone]  Fax [phone]

## 2021-12-09 DIAGNOSIS — M1991 Primary osteoarthritis, unspecified site: Secondary | ICD-10-CM | POA: Diagnosis not present

## 2021-12-09 DIAGNOSIS — Z79899 Other long term (current) drug therapy: Secondary | ICD-10-CM | POA: Diagnosis not present

## 2021-12-09 DIAGNOSIS — L4050 Arthropathic psoriasis, unspecified: Secondary | ICD-10-CM | POA: Diagnosis not present

## 2021-12-09 DIAGNOSIS — R5382 Chronic fatigue, unspecified: Secondary | ICD-10-CM | POA: Diagnosis not present

## 2021-12-09 DIAGNOSIS — M7918 Myalgia, other site: Secondary | ICD-10-CM | POA: Diagnosis not present

## 2021-12-09 DIAGNOSIS — G8929 Other chronic pain: Secondary | ICD-10-CM | POA: Diagnosis not present

## 2021-12-09 DIAGNOSIS — Z6841 Body Mass Index (BMI) 40.0 and over, adult: Secondary | ICD-10-CM | POA: Diagnosis not present

## 2021-12-09 DIAGNOSIS — L409 Psoriasis, unspecified: Secondary | ICD-10-CM | POA: Diagnosis not present

## 2022-02-03 DIAGNOSIS — N3281 Overactive bladder: Secondary | ICD-10-CM | POA: Diagnosis not present

## 2022-02-25 ENCOUNTER — Ambulatory Visit: Payer: Medicare PPO | Admitting: Podiatry

## 2022-03-06 DIAGNOSIS — K130 Diseases of lips: Secondary | ICD-10-CM | POA: Diagnosis not present

## 2022-03-31 DIAGNOSIS — Z6841 Body Mass Index (BMI) 40.0 and over, adult: Secondary | ICD-10-CM | POA: Diagnosis not present

## 2022-03-31 DIAGNOSIS — M7918 Myalgia, other site: Secondary | ICD-10-CM | POA: Diagnosis not present

## 2022-03-31 DIAGNOSIS — M1991 Primary osteoarthritis, unspecified site: Secondary | ICD-10-CM | POA: Diagnosis not present

## 2022-03-31 DIAGNOSIS — G2581 Restless legs syndrome: Secondary | ICD-10-CM | POA: Diagnosis not present

## 2022-03-31 DIAGNOSIS — G629 Polyneuropathy, unspecified: Secondary | ICD-10-CM | POA: Diagnosis not present

## 2022-03-31 DIAGNOSIS — R5382 Chronic fatigue, unspecified: Secondary | ICD-10-CM | POA: Diagnosis not present

## 2022-03-31 DIAGNOSIS — F419 Anxiety disorder, unspecified: Secondary | ICD-10-CM | POA: Diagnosis not present

## 2022-03-31 DIAGNOSIS — Z23 Encounter for immunization: Secondary | ICD-10-CM | POA: Diagnosis not present

## 2022-03-31 DIAGNOSIS — Z79899 Other long term (current) drug therapy: Secondary | ICD-10-CM | POA: Diagnosis not present

## 2022-03-31 DIAGNOSIS — N183 Chronic kidney disease, stage 3 unspecified: Secondary | ICD-10-CM | POA: Diagnosis not present

## 2022-03-31 DIAGNOSIS — L409 Psoriasis, unspecified: Secondary | ICD-10-CM | POA: Diagnosis not present

## 2022-03-31 DIAGNOSIS — G8929 Other chronic pain: Secondary | ICD-10-CM | POA: Diagnosis not present

## 2022-03-31 DIAGNOSIS — L4050 Arthropathic psoriasis, unspecified: Secondary | ICD-10-CM | POA: Diagnosis not present

## 2022-03-31 DIAGNOSIS — F324 Major depressive disorder, single episode, in partial remission: Secondary | ICD-10-CM | POA: Diagnosis not present

## 2022-03-31 DIAGNOSIS — I129 Hypertensive chronic kidney disease with stage 1 through stage 4 chronic kidney disease, or unspecified chronic kidney disease: Secondary | ICD-10-CM | POA: Diagnosis not present

## 2022-05-14 DIAGNOSIS — L4050 Arthropathic psoriasis, unspecified: Secondary | ICD-10-CM | POA: Diagnosis not present

## 2022-05-14 DIAGNOSIS — Z6841 Body Mass Index (BMI) 40.0 and over, adult: Secondary | ICD-10-CM | POA: Diagnosis not present

## 2022-05-14 DIAGNOSIS — G629 Polyneuropathy, unspecified: Secondary | ICD-10-CM | POA: Diagnosis not present

## 2022-05-14 DIAGNOSIS — R7982 Elevated C-reactive protein (CRP): Secondary | ICD-10-CM | POA: Diagnosis not present

## 2022-05-14 DIAGNOSIS — Z79899 Other long term (current) drug therapy: Secondary | ICD-10-CM | POA: Diagnosis not present

## 2022-05-22 DIAGNOSIS — J0101 Acute recurrent maxillary sinusitis: Secondary | ICD-10-CM | POA: Diagnosis not present

## 2022-06-09 DIAGNOSIS — F419 Anxiety disorder, unspecified: Secondary | ICD-10-CM | POA: Diagnosis not present

## 2022-06-09 DIAGNOSIS — L4050 Arthropathic psoriasis, unspecified: Secondary | ICD-10-CM | POA: Diagnosis not present

## 2022-06-09 DIAGNOSIS — Z6841 Body Mass Index (BMI) 40.0 and over, adult: Secondary | ICD-10-CM | POA: Diagnosis not present

## 2022-06-09 DIAGNOSIS — E538 Deficiency of other specified B group vitamins: Secondary | ICD-10-CM | POA: Diagnosis not present

## 2022-06-09 DIAGNOSIS — Z0000 Encounter for general adult medical examination without abnormal findings: Secondary | ICD-10-CM | POA: Diagnosis not present

## 2022-06-09 DIAGNOSIS — I700 Atherosclerosis of aorta: Secondary | ICD-10-CM | POA: Diagnosis not present

## 2022-06-09 DIAGNOSIS — I10 Essential (primary) hypertension: Secondary | ICD-10-CM | POA: Diagnosis not present

## 2022-06-09 DIAGNOSIS — F324 Major depressive disorder, single episode, in partial remission: Secondary | ICD-10-CM | POA: Diagnosis not present

## 2022-06-09 DIAGNOSIS — G629 Polyneuropathy, unspecified: Secondary | ICD-10-CM | POA: Diagnosis not present

## 2022-07-09 NOTE — Progress Notes (Signed)
Cardiology Office Note   Date:  07/10/2022   ID:  [name] [name], [name] [name] [birth year], MRN [id number]  PCP:  Laurann Montana, MD  Cardiologist:   Rollene Rotunda, MD Referring:  Laurann Montana, MD  Chief Complaint  Patient presents with   Fatigue      History of Present Illness: [name] [name] is a 67 y.o. female who presents for who referred by Laurann Montana, MD for evaluation of questionable pulmonary artery hypertension.  I last saw her in 2017 for evaluation of bradycardia.  IShe has had this noted before. A Holter demonstrated only low heart rates when she was asleep. POET (Plain Old Exercise Treadmill) demonstrated normal heart rate response with her heart rate increasing up to 153. She did have a negative stress perfusion study in 2015. She had some basal hypertrophy on echocardiogram.  (However, there was no evidence of this on echo in 2017.)  I saw her last in 2021.  She had SOB.  She had no evidence of pulmonary HTN on echo.   BNP was normal.    She was referred for evaluation of aortic atherosclerosis.  I do see that since I saw her in 2023 she had a perfusion study that was negative for any evidence of ischemia.  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  This is new since the 2021 EKG.  She has had no new cardiovascular symptoms.  She works in the yard.  She does laundry.  She pushes a vacuum cleaner and a lawnmower. The patient denies any new symptoms such as chest discomfort, neck or arm discomfort. There has been no new shortness of breath, PND or orthopnea. There have been no reported palpitations, presyncope or syncope.  I also note that she has had sleep apnea and was to get a CPAP but she did not follow through on this.  This was last year.  I reviewed these notes for this visit.   Past Medical History:  Diagnosis Date   Allergic rhinitis    Arthritis    Depression    Factor V Leiden carrier  08/26/2011   Fatty liver    Fibroids    dysfuction uterine bleeding   GERD (gastroesophageal reflux disease)    Hypertension    Osteoarthritis    Peripheral neuropathy    Psoriatic arthritis    Restless leg syndrome     Past Surgical History:  Procedure Laterality Date   CHOLECYSTECTOMY     KNEE SURGERY     right TKR   LUMBAR SPINE SURGERY     TUBAL LIGATION     bilateral   URETHRAL SLING     VAGINAL HYSTERECTOMY       Current Outpatient Medications  Medication Sig Dispense Refill   acetaminophen (TYLENOL) 325 MG tablet Take 650 mg by mouth every 6 (six) hours as needed for mild pain.     albuterol (VENTOLIN HFA) 108 (90 Base) MCG/ACT inhaler Inhale 2 puffs into the lungs every 6 (six) hours as needed for shortness of breath. 18 g 11   azelastine (ASTELIN) 0.1 % nasal spray Place 1 spray into both nostrils 2 (two) times daily. Use in each nostril as directed 30 mL 5   cetirizine (ZYRTEC) 10 MG tablet Take 10 mg by mouth as needed for allergies.     cholecalciferol (VITAMIN D3) 25 MCG (1000 UNIT) tablet Take 1,000 Units by mouth daily.  Ixekizumab (TALTZ) 80 MG/ML SOAJ Inject 80 mg as directed.     losartan-hydrochlorothiazide (HYZAAR) 50-12.5 MG tablet Take 1 tablet by mouth daily.      MYRBETRIQ 50 MG TB24 tablet Take 50 mg by mouth daily.      pantoprazole (PROTONIX) 40 MG tablet Take 40 mg by mouth 2 (two) times daily.     potassium chloride SA (K-DUR) 20 MEQ tablet Take 1 tablet (20 mEq total) by mouth daily. Take one tablet twice a day for five days, then one tablet once a day (Patient taking differently: Take 10 mEq by mouth daily.) 35 tablet 0   pramipexole (MIRAPEX) 0.75 MG tablet Take 0.75 mg by mouth 3 (three) times daily.     pregabalin (LYRICA) 150 MG capsule Take 150 mg by mouth 2 (two) times daily.     pregabalin (LYRICA) 75 MG capsule Take 75 mg by mouth at bedtime. In addition to the 150 mg bid     rOPINIRole (REQUIP) 2 MG tablet Take 1 tablet (2 mg total) by  mouth at bedtime. 90 tablet 4   traMADol (ULTRAM) 50 MG tablet Take 1 tablet (50 mg total) by mouth 3 (three) times daily as needed. 30 tablet 0   No current facility-administered medications for this visit.    Allergies:   Codeine, Diclofenac sodium, and Flonase [fluticasone]    Social History:  The patient  reports that she has never smoked. She has never used smokeless tobacco. She reports that she does not drink alcohol and does not use drugs.   Family History:  The patient's family history includes Cancer in her sister; Coronary artery disease (age of onset: 11) in her father; Deep vein thrombosis in her mother; Heart failure in her maternal grandmother; Hypertension (age of onset: 56) in her mother; Liver disease in her sister; Neuropathy in her mother; Osteoporosis in her mother; Other in her sister; Stroke in her father.    ROS:  Please see the history of present illness.   Otherwise, review of systems are positive for none.   All other systems are reviewed and negative.    PHYSICAL EXAM: VS:  BP 130/70 (BP Location: Right Arm, Patient Position: Sitting, Cuff Size: Large)   Pulse 78   Ht 5\' 4"  (1.626 m)   Wt 257 lb (116.6 kg)   SpO2 95%   BMI 44.11 kg/m  , BMI Body mass index is 44.11 kg/m. GENERAL:  Well appearing HEENT:  Pupils equal round and reactive, fundi not visualized, oral mucosa unremarkable NECK:  No jugular venous distention, waveform within normal limits, carotid upstroke brisk and symmetric, no bruits, no thyromegaly LYMPHATICS:  No cervical, inguinal adenopathy LUNGS:  Clear to auscultation bilaterally BACK:  No CVA tenderness CHEST:  Unremarkable HEART:  PMI not displaced or sustained,S1 and S2 within normal limits, no S3, no S4, no clicks, no rubs, no murmurs ABD:  Flat, positive bowel sounds normal in frequency in pitch, no bruits, no rebound, no guarding, no midline pulsatile mass, no hepatomegaly, no splenomegaly EXT:  2 plus pulses throughout, trace  edema, no cyanosis no clubbing SKIN:  No rashes no nodules NEURO:  Cranial nerves II through XII grossly intact, motor grossly intact throughout PSYCH:  Cognitively intact, oriented to person place and time  EKG:  EKG is ordered today. The ekg ordered today demonstrates sinus rhythm, rate 78, right bundle branch block, left intrafascicular block, no acute ST-T wave changes.   Recent Labs: No results found for requested labs  within last 365 days.    Lipid Panel No results found for: "CHOL", "TRIG", "HDL", "CHOLHDL", "VLDL", "LDLCALC", "LDLDIRECT"    Wt Readings from Last 3 Encounters:  07/10/22 257 lb (116.6 kg)  09/28/21 260 lb (117.9 kg)  08/06/21 263 lb 6.4 oz (119.5 kg)      Other studies Reviewed: Additional studies/ records that were reviewed today include: Primary care records, pulmonary records. Review of the above records demonstrates:  Please see elsewhere   ASSESSMENT AND PLAN:  AORTIC ATHEROSCLEROSIS: I see this mention on CT of her abdomen pelvis.  I think she needs primary risk reduction but I do not think she needs addition of a statin.  Her LDL was 79 in March.  HDL 39.    I have further suggested that she eliminate pork and beef from her diet and she will try this and get repeat testing.    HYPERTENSION : Her blood pressure is controlled.  No change in therapy.    SEPTAL HYPERTROPHY :  There was no evidence of septal hypertrophy on the last echo in 2021.    No further imaging.  RBBB/LAFB: She has no symptoms related to bradycardia arrhythmia.  We discussed this at length.  She would let me know if she ever has any presyncope or syncope.  SLEEP APNEA: We discussed this and she is going to call pulmonary back and pursue management does have fatigue.  Current medicines are reviewed at length with the patient today.  The patient does not have concerns regarding medicines.  The following changes have been made:  no change  Labs/ tests ordered today include:    Orders Placed This Encounter  Procedures   EKG 12-Lead     Disposition:   FU with me as needed.   Signed, Rollene RotundaJames Gamble Enderle, MD  07/10/2022 12:19 PM    [facility]

## 2022-07-10 ENCOUNTER — Encounter: Payer: Self-pay | Admitting: Cardiology

## 2022-07-10 ENCOUNTER — Ambulatory Visit: Payer: Medicare PPO | Attending: Cardiology | Admitting: Cardiology

## 2022-07-10 VITALS — BP 130/70 | HR 78 | Ht 64.0 in | Wt 257.0 lb

## 2022-07-10 DIAGNOSIS — I10 Essential (primary) hypertension: Secondary | ICD-10-CM | POA: Diagnosis not present

## 2022-07-10 DIAGNOSIS — R0602 Shortness of breath: Secondary | ICD-10-CM | POA: Diagnosis not present

## 2022-07-10 NOTE — Patient Instructions (Addendum)
Medication Instructions:  No changes  *If you need a refill on your cardiac medications before your next appointment, please call your pharmacy*   Lab Work: Not needed    Testing/Procedures:  Not needed  Follow-Up: At [facility], you and your health needs are our priority.  As part of our continuing mission to provide you with exceptional heart care, we have created designated Provider Care Teams.  These Care Teams include your primary Cardiologist (physician) and Advanced Practice Providers (APPs -  Physician Assistants and Nurse Practitioners) who all work together to provide you with the care you need, when you need it.     Your next appointment:   As needed  The format for your next appointment:   In Person  Provider:   James Hochrein, MD    

## 2022-07-17 DIAGNOSIS — R350 Frequency of micturition: Secondary | ICD-10-CM | POA: Diagnosis not present

## 2022-07-17 DIAGNOSIS — N3000 Acute cystitis without hematuria: Secondary | ICD-10-CM | POA: Diagnosis not present

## 2022-07-17 DIAGNOSIS — R000 Tachycardia, unspecified: Secondary | ICD-10-CM | POA: Diagnosis not present

## 2022-07-17 DIAGNOSIS — Z6841 Body Mass Index (BMI) 40.0 and over, adult: Secondary | ICD-10-CM | POA: Diagnosis not present

## 2022-07-19 ENCOUNTER — Emergency Department (HOSPITAL_COMMUNITY): Payer: Medicare PPO

## 2022-07-19 ENCOUNTER — Other Ambulatory Visit: Payer: Self-pay

## 2022-07-19 ENCOUNTER — Encounter (HOSPITAL_COMMUNITY): Payer: Self-pay | Admitting: *Deleted

## 2022-07-19 ENCOUNTER — Inpatient Hospital Stay (HOSPITAL_COMMUNITY)
Admission: EM | Admit: 2022-07-19 | Discharge: 2022-07-24 | DRG: 871 | Disposition: A | Discharge status: Home / Self Care | Payer: Medicare PPO | Attending: Internal Medicine | Admitting: Internal Medicine

## 2022-07-19 DIAGNOSIS — N281 Cyst of kidney, acquired: Secondary | ICD-10-CM | POA: Diagnosis not present

## 2022-07-19 DIAGNOSIS — E872 Acidosis, unspecified: Secondary | ICD-10-CM | POA: Diagnosis present

## 2022-07-19 DIAGNOSIS — A4151 Sepsis due to Escherichia coli [E. coli]: Secondary | ICD-10-CM | POA: Diagnosis present

## 2022-07-19 DIAGNOSIS — T361X5A Adverse effect of cephalosporins and other beta-lactam antibiotics, initial encounter: Secondary | ICD-10-CM | POA: Diagnosis not present

## 2022-07-19 DIAGNOSIS — N1831 Chronic kidney disease, stage 3a: Secondary | ICD-10-CM | POA: Diagnosis present

## 2022-07-19 DIAGNOSIS — G2581 Restless legs syndrome: Secondary | ICD-10-CM | POA: Diagnosis present

## 2022-07-19 DIAGNOSIS — Z66 Do not resuscitate: Secondary | ICD-10-CM | POA: Diagnosis present

## 2022-07-19 DIAGNOSIS — K760 Fatty (change of) liver, not elsewhere classified: Secondary | ICD-10-CM | POA: Diagnosis present

## 2022-07-19 DIAGNOSIS — G4733 Obstructive sleep apnea (adult) (pediatric): Secondary | ICD-10-CM | POA: Diagnosis present

## 2022-07-19 DIAGNOSIS — Z6841 Body Mass Index (BMI) 40.0 and over, adult: Secondary | ICD-10-CM

## 2022-07-19 DIAGNOSIS — Z8249 Family history of ischemic heart disease and other diseases of the circulatory system: Secondary | ICD-10-CM

## 2022-07-19 DIAGNOSIS — R0602 Shortness of breath: Secondary | ICD-10-CM | POA: Diagnosis present

## 2022-07-19 DIAGNOSIS — E861 Hypovolemia: Secondary | ICD-10-CM | POA: Diagnosis present

## 2022-07-19 DIAGNOSIS — Z79899 Other long term (current) drug therapy: Secondary | ICD-10-CM

## 2022-07-19 DIAGNOSIS — Z8262 Family history of osteoporosis: Secondary | ICD-10-CM

## 2022-07-19 DIAGNOSIS — K219 Gastro-esophageal reflux disease without esophagitis: Secondary | ICD-10-CM | POA: Diagnosis present

## 2022-07-19 DIAGNOSIS — Z885 Allergy status to narcotic agent status: Secondary | ICD-10-CM

## 2022-07-19 DIAGNOSIS — G629 Polyneuropathy, unspecified: Secondary | ICD-10-CM | POA: Diagnosis present

## 2022-07-19 DIAGNOSIS — N179 Acute kidney failure, unspecified: Secondary | ICD-10-CM | POA: Diagnosis present

## 2022-07-19 DIAGNOSIS — B379 Candidiasis, unspecified: Secondary | ICD-10-CM | POA: Diagnosis not present

## 2022-07-19 DIAGNOSIS — A419 Sepsis, unspecified organism: Principal | ICD-10-CM

## 2022-07-19 DIAGNOSIS — R6520 Severe sepsis without septic shock: Secondary | ICD-10-CM | POA: Diagnosis not present

## 2022-07-19 DIAGNOSIS — N3000 Acute cystitis without hematuria: Secondary | ICD-10-CM | POA: Diagnosis present

## 2022-07-19 DIAGNOSIS — L4050 Arthropathic psoriasis, unspecified: Secondary | ICD-10-CM | POA: Diagnosis present

## 2022-07-19 DIAGNOSIS — Z888 Allergy status to other drugs, medicaments and biological substances status: Secondary | ICD-10-CM

## 2022-07-19 DIAGNOSIS — D6851 Activated protein C resistance: Secondary | ICD-10-CM | POA: Diagnosis present

## 2022-07-19 DIAGNOSIS — I129 Hypertensive chronic kidney disease with stage 1 through stage 4 chronic kidney disease, or unspecified chronic kidney disease: Secondary | ICD-10-CM | POA: Diagnosis present

## 2022-07-19 DIAGNOSIS — N3281 Overactive bladder: Secondary | ICD-10-CM | POA: Diagnosis present

## 2022-07-19 DIAGNOSIS — K589 Irritable bowel syndrome without diarrhea: Secondary | ICD-10-CM | POA: Diagnosis present

## 2022-07-19 DIAGNOSIS — R6521 Severe sepsis with septic shock: Principal | ICD-10-CM | POA: Diagnosis present

## 2022-07-19 DIAGNOSIS — Z0389 Encounter for observation for other suspected diseases and conditions ruled out: Secondary | ICD-10-CM | POA: Diagnosis not present

## 2022-07-19 DIAGNOSIS — N12 Tubulo-interstitial nephritis, not specified as acute or chronic: Secondary | ICD-10-CM | POA: Diagnosis not present

## 2022-07-19 DIAGNOSIS — E876 Hypokalemia: Secondary | ICD-10-CM | POA: Diagnosis present

## 2022-07-19 DIAGNOSIS — I5032 Chronic diastolic (congestive) heart failure: Secondary | ICD-10-CM | POA: Diagnosis not present

## 2022-07-19 DIAGNOSIS — E871 Hypo-osmolality and hyponatremia: Secondary | ICD-10-CM | POA: Diagnosis present

## 2022-07-19 DIAGNOSIS — R000 Tachycardia, unspecified: Secondary | ICD-10-CM | POA: Diagnosis not present

## 2022-07-19 DIAGNOSIS — Z90710 Acquired absence of both cervix and uterus: Secondary | ICD-10-CM

## 2022-07-19 DIAGNOSIS — Z823 Family history of stroke: Secondary | ICD-10-CM

## 2022-07-19 LAB — COMPREHENSIVE METABOLIC PANEL
ALT: 38 U/L (ref 0–44)
AST: 60 U/L — ABNORMAL HIGH (ref 15–41)
Albumin: 2.8 g/dL — ABNORMAL LOW (ref 3.5–5.0)
Alkaline Phosphatase: 88 U/L (ref 38–126)
Anion gap: 16 — ABNORMAL HIGH (ref 5–15)
BUN: 33 mg/dL — ABNORMAL HIGH (ref 8–23)
CO2: 19 mmol/L — ABNORMAL LOW (ref 22–32)
Calcium: 8.6 mg/dL — ABNORMAL LOW (ref 8.9–10.3)
Chloride: 93 mmol/L — ABNORMAL LOW (ref 98–111)
Creatinine, Ser: 2.76 mg/dL — ABNORMAL HIGH (ref 0.44–1.00)
GFR, Estimated: 18 mL/min — ABNORMAL LOW (ref >60)
Glucose, Bld: 175 mg/dL — ABNORMAL HIGH (ref 70–99)
Potassium: 3.1 mmol/L — ABNORMAL LOW (ref 3.5–5.1)
Sodium: 128 mmol/L — ABNORMAL LOW (ref 135–145)
Total Bilirubin: 1.4 mg/dL — ABNORMAL HIGH (ref 0.3–1.2)
Total Protein: 6.7 g/dL (ref 6.5–8.1)

## 2022-07-19 LAB — CBC WITH DIFFERENTIAL/PLATELET
Abs Immature Granulocytes: 0.16 10*3/uL — ABNORMAL HIGH (ref 0.00–0.07)
Basophils Absolute: 0 10*3/uL (ref 0.0–0.1)
Basophils Relative: 0 %
Eosinophils Absolute: 0 10*3/uL (ref 0.0–0.5)
Eosinophils Relative: 0 %
HCT: 42.4 % (ref 36.0–46.0)
Hemoglobin: 14.3 g/dL (ref 12.0–15.0)
Immature Granulocytes: 1 %
Lymphocytes Relative: 8 %
Lymphs Abs: 1.2 10*3/uL (ref 0.7–4.0)
MCH: 29.7 pg (ref 26.0–34.0)
MCHC: 33.7 g/dL (ref 30.0–36.0)
MCV: 88.1 fL (ref 80.0–100.0)
Monocytes Absolute: 0.8 10*3/uL (ref 0.1–1.0)
Monocytes Relative: 5 %
Neutro Abs: 12.9 10*3/uL — ABNORMAL HIGH (ref 1.7–7.7)
Neutrophils Relative %: 86 %
Platelets: 190 10*3/uL (ref 150–400)
RBC: 4.81 MIL/uL (ref 3.87–5.11)
RDW: 15.2 % (ref 11.5–15.5)
WBC: 15.1 10*3/uL — ABNORMAL HIGH (ref 4.0–10.5)
nRBC: 0 % (ref 0.0–0.2)

## 2022-07-19 LAB — PROTIME-INR
INR: 1.3 — ABNORMAL HIGH (ref 0.8–1.2)
Prothrombin Time: 15.9 seconds — ABNORMAL HIGH (ref 11.4–15.2)

## 2022-07-19 LAB — LACTIC ACID, PLASMA: Lactic Acid, Venous: 5.2 mmol/L (ref 0.5–1.9)

## 2022-07-19 MED ORDER — SODIUM CHLORIDE 0.9 % IV SOLN
2.0000 g | INTRAVENOUS | Status: DC
  Administered 2022-07-19 – 2022-07-22 (×4): 2 g via INTRAVENOUS
  Filled 2022-07-19 (×4): qty 20

## 2022-07-19 MED ORDER — NOREPINEPHRINE 4 MG/250ML-% IV SOLN
0.0000 ug/min | INTRAVENOUS | Status: DC
  Administered 2022-07-19: 2 ug/min via INTRAVENOUS
  Filled 2022-07-19: qty 250

## 2022-07-19 MED ORDER — SODIUM CHLORIDE 0.9 % IV BOLUS
2000.0000 mL | Freq: Once | INTRAVENOUS | Status: AC
  Administered 2022-07-19: 2000 mL via INTRAVENOUS

## 2022-07-19 MED ORDER — SODIUM CHLORIDE 0.9 % IV BOLUS (SEPSIS): 1000.0000 mL | Freq: Once | INTRAVENOUS | Status: DC

## 2022-07-19 MED ORDER — SODIUM CHLORIDE 0.9 % IV SOLN: Freq: Once | INTRAVENOUS | Status: DC

## 2022-07-19 NOTE — Sepsis Progress Note (Signed)
Elink following for sepsis protocol. 

## 2022-07-19 NOTE — ED Triage Notes (Signed)
The pt is c.o sobunable to walk abd pain    she was seen for a uti Friday  and diagnosed with a uti  elevated temp for 3 days  she is alert but cold sweating

## 2022-07-19 NOTE — ED Provider Notes (Signed)
[place] [facility] AT [facility] Provider Note  CSN: [id number] Arrival date & time: 07/19/22 2053  Chief Complaint(s) Shortness of Breath  HPI [name] [name] is a 67 y.o. female with a past medical history listed below who presents to the emergency department for lower abdominal pain.  She reports she was recently diagnosed with a urinary tract infection and has been on Macrobid for the past couple days.  She reported fevers at home and that timeframe.  States that she was taking her medication but has not had any relief.  She reports bilateral flank pain as well.  No nausea or vomiting.  No diarrhea.  She reports dyspnea on exertion and generalized fatigue.  The history is provided by the patient.    Past Medical History Past Medical History:  Diagnosis Date   Allergic rhinitis    Arthritis    Depression    Factor V Leiden carrier 08/26/2011   Fatty liver    Fibroids    dysfuction uterine bleeding   GERD (gastroesophageal reflux disease)    Hypertension    Osteoarthritis    Peripheral neuropathy    Psoriatic arthritis    Restless leg syndrome    Patient Active Problem List   Diagnosis Date Noted   Pulmonary hypertension, unspecified 05/25/2019   Educated about COVID-19 virus infection 05/24/2019   Obstructive hypertrophic cardiomyopathy 05/24/2019   OAB (overactive bladder) 09/29/2017   OSA (obstructive sleep apnea) 09/29/2017   Urge urinary incontinence 09/29/2017   Fibromyalgia 11/11/2016   Atrial septal hypertrophy 10/21/2015   Paresthesia of both feet 07/31/2015   Bradycardia 08/14/2014   Obesity 10/09/2013   Chest pain 10/08/2013   Factor V Leiden carrier 08/26/2011   Protein S deficiency 08/26/2011   MORBID OBESITY 06/06/2010   DEPRESSION 06/06/2010   RESTLESS LEG SYNDROME 06/06/2010   Essential hypertension 06/06/2010   ALLERGIC RHINITIS 06/06/2010   GERD 06/06/2010   FATTY LIVER DISEASE 06/06/2010   OSTEOARTHRITIS 06/06/2010    MYOFASCIAL PAIN SYNDROME 06/06/2010   EDEMA 06/06/2010   DYSPNEA 06/06/2010   CHEST PAIN UNSPECIFIED 06/06/2010   Home Medication(s) Prior to Admission medications   Medication Sig Start Date End Date Taking? Authorizing Provider  acetaminophen (TYLENOL) 325 MG tablet Take 650 mg by mouth every 6 (six) hours as needed for mild pain.    [provider]  albuterol (VENTOLIN HFA) 108 (90 Base) MCG/ACT inhaler Inhale 2 puffs into the lungs every 6 (six) hours as needed for shortness of breath. 03/28/21   Omar Person, MD  azelastine (ASTELIN) 0.1 % nasal spray Place 1 spray into both nostrils 2 (two) times daily. Use in each nostril as directed 10/25/19   Glenford Bayley, NP  cetirizine (ZYRTEC) 10 MG tablet Take 10 mg by mouth as needed for allergies.    [provider]  cholecalciferol (VITAMIN D3) 25 MCG (1000 UNIT) tablet Take 1,000 Units by mouth daily.    [provider]  Ixekizumab (TALTZ) 80 MG/ML SOAJ Inject 80 mg as directed.    [provider]  losartan-hydrochlorothiazide (HYZAAR) 50-12.5 MG tablet Take 1 tablet by mouth daily.  07/07/18   [provider]  MYRBETRIQ 50 MG TB24 tablet Take 50 mg by mouth daily.  03/29/17   [provider]  pantoprazole (PROTONIX) 40 MG tablet Take 40 mg by mouth 2 (two) times daily.    [provider]  potassium chloride SA (K-DUR) 20 MEQ tablet Take 1 tablet (20 mEq total) by  mouth daily. Take one tablet twice a day for five days, then one tablet once a day Patient taking differently: Take 10 mEq by mouth daily. 08/02/18   Dione Booze, MD  pramipexole (MIRAPEX) 0.75 MG tablet Take 0.75 mg by mouth 3 (three) times daily.    [provider]  pregabalin (LYRICA) 150 MG capsule Take 150 mg by mouth 2 (two) times daily. 01/11/20   [provider]  pregabalin (LYRICA) 75 MG capsule Take 75 mg by mouth at bedtime. In addition to the 150 mg bid    [provider]   rOPINIRole (REQUIP) 2 MG tablet Take 1 tablet (2 mg total) by mouth at bedtime. 05/02/18 08/02/27  Lomax, Amy, NP  traMADol (ULTRAM) 50 MG tablet Take 1 tablet (50 mg total) by mouth 3 (three) times daily as needed. 05/13/21   Cristie Hem, PA-C                                                                                                                                    Allergies Codeine, Diclofenac sodium, and Flonase [fluticasone]  Review of Systems Review of Systems As noted in HPI  Physical Exam Vital Signs  I have reviewed the triage vital signs BP (!) 115/48   Pulse 94   Temp 97.9 F (36.6 C)   Resp 20   Ht  (1.626 m)   Wt 116.6 kg   SpO2 100%   BMI 44.12 kg/m   Physical Exam Vitals reviewed.  Constitutional:      General: She is not in acute distress.    Appearance: She is well-developed. She is not diaphoretic.  HENT:     Head: Normocephalic and atraumatic.     Nose: Nose normal.  Eyes:     General: No scleral icterus.       Right eye: No discharge.        Left eye: No discharge.     Conjunctiva/sclera: Conjunctivae normal.     Pupils: Pupils are equal, round, and reactive to light.  Cardiovascular:     Rate and Rhythm: Normal rate and regular rhythm.     Heart sounds: No murmur heard.    No friction rub. No gallop.  Pulmonary:     Effort: Pulmonary effort is normal. Tachypnea present. No respiratory distress.     Breath sounds: Normal breath sounds. No stridor. No rales.  Abdominal:     General: There is no distension.     Palpations: Abdomen is soft.     Tenderness: There is abdominal tenderness in the suprapubic area. There is right CVA tenderness and left CVA tenderness.  Musculoskeletal:        General: No tenderness.     Cervical back: Normal range of motion and neck supple.  Skin:    General: Skin is warm and dry.     Findings: No erythema or rash.  Neurological:  Mental Status: She is alert and oriented to person, place, and time.      ED Results and Treatments Labs (all labs ordered are listed, but only abnormal results are displayed) Labs Reviewed  COMPREHENSIVE METABOLIC PANEL - Abnormal; Notable for the following components:      Result Value   Sodium 128 (*)    Potassium 3.1 (*)    Chloride 93 (*)    CO2 19 (*)    Glucose, Bld 175 (*)    BUN 33 (*)    Creatinine, Ser 2.76 (*)    Calcium 8.6 (*)    Albumin 2.8 (*)    AST 60 (*)    Total Bilirubin 1.4 (*)    GFR, Estimated 18 (*)    Anion gap 16 (*)    All other components within normal limits  LACTIC ACID, PLASMA - Abnormal; Notable for the following components:   Lactic Acid, Venous 5.2 (*)    All other components within normal limits  CBC WITH DIFFERENTIAL/PLATELET - Abnormal; Notable for the following components:   WBC 15.1 (*)    Neutro Abs 12.9 (*)    Abs Immature Granulocytes 0.16 (*)    All other components within normal limits  PROTIME-INR - Abnormal; Notable for the following components:   Prothrombin Time 15.9 (*)    INR 1.3 (*)    All other components within normal limits  CULTURE, BLOOD (ROUTINE X 2)  CULTURE, BLOOD (ROUTINE X 2)  LACTIC ACID, PLASMA  URINALYSIS, ROUTINE W REFLEX MICROSCOPIC                                                                                                                         EKG  EKG Interpretation  Date/Time:    Ventricular Rate:    PR Interval:    QRS Duration:   QT Interval:    QTC Calculation:   R Axis:     Text Interpretation:         Radiology CT Renal Stone Study  Result Date: 07/19/2022 CLINICAL DATA:  Abdominal/flank pain, stone suspected EXAM: CT ABDOMEN AND PELVIS WITHOUT CONTRAST TECHNIQUE: Multidetector CT imaging of the abdomen and pelvis was performed following the standard protocol without IV contrast. RADIATION DOSE REDUCTION: This exam was performed according to the departmental dose-optimization program which includes automated exposure control, adjustment of the mA  and/or kV according to patient size and/or use of iterative reconstruction technique. COMPARISON:  CT 02/18/2021 FINDINGS: Lower chest: No acute basilar airspace disease. 3 mm right lower lobe pulmonary nodule, series 4, image 5. This is unchanged dating back to 2021 exam and considered benign, needing no further imaging follow-up. Hepatobiliary: The liver is enlarged measuring 20 cm cranial caudal with diffuse steatosis. No evidence of focal liver lesion on this unenhanced exam. Clips in the gallbladder fossa postcholecystectomy. No biliary dilatation. Pancreas: Parenchymal atrophy. No ductal dilatation or inflammation. Spleen: Mildly enlarged spanning 14.3 cm AP. Adrenals/Urinary Tract: No adrenal nodule. Moderate left  perinephric fat stranding and edema. Cystic changes at the left renal hilum corresponds to parapelvic cysts on prior contrast-enhanced CT. No cyst follow-up is recommended. There is no frank hydronephrosis. No renal or ureteral calculi. Minimal right perinephric edema. No hydronephrosis. Small right renal cortical and parapelvic cysts, needing no further imaging follow-up. Partially distended urinary bladder, no bladder wall thickening. No bladder stone. Stomach/Bowel: The stomach is nondistended. Occasional fluid-filled small bowel but no obstruction or inflammation. There is fluid/liquid stool throughout the colon. No bowel wall thickening. The appendix is not definitively seen on the current exam. There is no evidence of appendicitis. Vascular/Lymphatic: Minimal aortic atherosclerosis. No aortic aneurysm. Few prominent porta hepatis nodes which are likely reactive. No suspicious abdominopelvic adenopathy. Reproductive: Status post hysterectomy. No adnexal masses. Other: No free air or ascites. Diminutive fat containing umbilical hernia. Musculoskeletal: Diffuse lumbar degenerative change. Modic endplate changes at T12-L1, unchanged. There are no acute or suspicious osseous abnormalities.  IMPRESSION: 1. No renal stones. 2. Moderate left perinephric fat stranding and edema, suspicious for pyelonephritis. Recommend correlation with urinalysis. Alternatively this may represent sequela of recently passed stone. 3. Fluid/liquid stool throughout the colon, can be seen with diarrheal illness. 4. Hepatosplenomegaly and hepatic steatosis. Aortic Atherosclerosis (ICD10-I70.0). Electronically Signed   By: Narda Rutherford M.D.   On: 07/19/2022 22:35   DG Chest Portable 1 View  Result Date: 07/19/2022 CLINICAL DATA:  Suspected sepsis. EXAM: PORTABLE CHEST 1 VIEW COMPARISON:  February 10, 2021 FINDINGS: The heart size and mediastinal contours are within normal limits. Both lungs are clear. The visualized skeletal structures are unremarkable. IMPRESSION: No active disease. Electronically Signed   By: Aram Candela M.D.   On: 07/19/2022 21:58    Medications Ordered in ED Medications  cefTRIAXone (ROCEPHIN) 2 g in sodium chloride 0.9 % 100 mL IVPB (0 g Intravenous Stopped 07/19/22 2312)  0.9 %  sodium chloride infusion (has no administration in time range)  norepinephrine (LEVOPHED) 4mg  in (0.016 mg/mL) premix infusion (4 mcg/min Intravenous Rate/Dose Change 07/19/22 2325)  sodium chloride 0.9 % bolus 1,000 mL (has no administration in time range)    Followed by  sodium chloride 0.9 % bolus 1,000 mL (has no administration in time range)  sodium chloride 0.9 % bolus 2,000 mL (0 mLs Intravenous Stopped 07/19/22 2312)                                                                                                                                     Procedures .Critical Care  Performed by: Nira Conn, MD Authorized by: Nira Conn, MD   Critical care provider statement:    Critical care time (minutes):  45   Critical care time was exclusive of:  Separately billable procedures and treating other patients   Critical care was necessary to treat or prevent imminent or  life-threatening deterioration of the following conditions:  Sepsis, respiratory failure and shock  Critical care was time spent personally by me on the following activities:  Development of treatment plan with patient or surrogate, discussions with consultants, evaluation of patient's response to treatment, examination of patient, obtaining history from patient or surrogate, review of old charts, re-evaluation of patient's condition, pulse oximetry, ordering and review of radiographic studies, ordering and review of laboratory studies and ordering and performing treatments and interventions   Care discussed with: admitting provider     (including critical care time)  Medical Decision Making / ED Course  Click here for ABCD2, HEART and other calculators  Medical Decision Making Amount and/or Complexity of Data Reviewed Labs: ordered. Decision-making details documented in ED Course. Radiology: ordered and independent interpretation performed. Decision-making details documented in ED Course.  Risk Decision regarding hospitalization.   This patient presents to the ED for concern of abd pain; noted to be hypotensive, this involves an extensive number of treatment options, and is a complaint that carries with it a high risk of complications and morbidity. The differential diagnosis includes but not limited to sepsis, SOB, hypovolemia, severe anemia, etc.  Initial intervention:  IVF  Work up: (Labs and/or imaging independently interpreted by me) CBC with leukocytosis.  No anemia Metabolic panel with mild hyponatremia, hypokalemia, hypochloremia.  Hyperglycemia without evidence of DKA.  Patient with AKI.  No bili obstruction Lactic acid elevated Chest x-ray without evidence of pneumonia, pneumothorax, pulmonary edema or pleural effusions CT stone study obtained to assess for any retained stones given her history but no renal stones noted.  There is evidence of  pyelonephritis.  Reassessment: Code sepsis was initiated and patient was started on empiric antibiotics in addition to the IV fluid boluses. Her initial blood pressure improved but trended down while on IV fluids. Additional IV fluids were ordered and patient was started on peripheral Levophed. Consulted critical care to admit for further workup and management.         Final Clinical Impression(s) / ED Diagnoses Final diagnoses:  Sepsis with acute renal failure and septic shock, due to unspecified organism, unspecified acute renal failure type           This chart was dictated using voice recognition software.  Despite best efforts to proofread,  errors can occur which can change the documentation meaning.    Nira Conn, MD 07/19/22 [id number]

## 2022-07-20 DIAGNOSIS — I5032 Chronic diastolic (congestive) heart failure: Secondary | ICD-10-CM | POA: Diagnosis not present

## 2022-07-20 DIAGNOSIS — N179 Acute kidney failure, unspecified: Secondary | ICD-10-CM | POA: Diagnosis present

## 2022-07-20 DIAGNOSIS — L4050 Arthropathic psoriasis, unspecified: Secondary | ICD-10-CM | POA: Diagnosis present

## 2022-07-20 DIAGNOSIS — Z6841 Body Mass Index (BMI) 40.0 and over, adult: Secondary | ICD-10-CM | POA: Diagnosis not present

## 2022-07-20 DIAGNOSIS — E871 Hypo-osmolality and hyponatremia: Secondary | ICD-10-CM | POA: Diagnosis present

## 2022-07-20 DIAGNOSIS — E872 Acidosis, unspecified: Secondary | ICD-10-CM | POA: Diagnosis present

## 2022-07-20 DIAGNOSIS — N3000 Acute cystitis without hematuria: Secondary | ICD-10-CM

## 2022-07-20 DIAGNOSIS — R6521 Severe sepsis with septic shock: Secondary | ICD-10-CM | POA: Diagnosis present

## 2022-07-20 DIAGNOSIS — N1831 Chronic kidney disease, stage 3a: Secondary | ICD-10-CM | POA: Diagnosis present

## 2022-07-20 DIAGNOSIS — A419 Sepsis, unspecified organism: Secondary | ICD-10-CM | POA: Diagnosis present

## 2022-07-20 DIAGNOSIS — T361X5A Adverse effect of cephalosporins and other beta-lactam antibiotics, initial encounter: Secondary | ICD-10-CM | POA: Diagnosis not present

## 2022-07-20 DIAGNOSIS — I129 Hypertensive chronic kidney disease with stage 1 through stage 4 chronic kidney disease, or unspecified chronic kidney disease: Secondary | ICD-10-CM | POA: Diagnosis present

## 2022-07-20 DIAGNOSIS — B379 Candidiasis, unspecified: Secondary | ICD-10-CM | POA: Diagnosis not present

## 2022-07-20 DIAGNOSIS — Z66 Do not resuscitate: Secondary | ICD-10-CM | POA: Diagnosis present

## 2022-07-20 DIAGNOSIS — N3281 Overactive bladder: Secondary | ICD-10-CM | POA: Diagnosis present

## 2022-07-20 DIAGNOSIS — G629 Polyneuropathy, unspecified: Secondary | ICD-10-CM | POA: Diagnosis present

## 2022-07-20 DIAGNOSIS — E876 Hypokalemia: Secondary | ICD-10-CM | POA: Diagnosis present

## 2022-07-20 DIAGNOSIS — K760 Fatty (change of) liver, not elsewhere classified: Secondary | ICD-10-CM | POA: Diagnosis present

## 2022-07-20 DIAGNOSIS — E861 Hypovolemia: Secondary | ICD-10-CM | POA: Diagnosis present

## 2022-07-20 DIAGNOSIS — K219 Gastro-esophageal reflux disease without esophagitis: Secondary | ICD-10-CM | POA: Diagnosis present

## 2022-07-20 DIAGNOSIS — K589 Irritable bowel syndrome without diarrhea: Secondary | ICD-10-CM | POA: Diagnosis present

## 2022-07-20 DIAGNOSIS — A4151 Sepsis due to Escherichia coli [E. coli]: Secondary | ICD-10-CM | POA: Diagnosis present

## 2022-07-20 DIAGNOSIS — G2581 Restless legs syndrome: Secondary | ICD-10-CM | POA: Diagnosis present

## 2022-07-20 DIAGNOSIS — G4733 Obstructive sleep apnea (adult) (pediatric): Secondary | ICD-10-CM | POA: Diagnosis present

## 2022-07-20 DIAGNOSIS — D6851 Activated protein C resistance: Secondary | ICD-10-CM | POA: Diagnosis present

## 2022-07-20 DIAGNOSIS — R0602 Shortness of breath: Secondary | ICD-10-CM | POA: Diagnosis present

## 2022-07-20 DIAGNOSIS — N12 Tubulo-interstitial nephritis, not specified as acute or chronic: Secondary | ICD-10-CM | POA: Diagnosis not present

## 2022-07-20 LAB — BLOOD CULTURE ID PANEL (REFLEXED) - BCID2

## 2022-07-20 LAB — GASTROINTESTINAL PANEL BY PCR, STOOL (REPLACES STOOL CULTURE)

## 2022-07-20 LAB — CBC
HCT: 38.1 % (ref 36.0–46.0)
HCT: 37.1 % (ref 36.0–46.0)
Hemoglobin: 12.5 g/dL (ref 12.0–15.0)
Hemoglobin: 12.2 g/dL (ref 12.0–15.0)
MCH: 29.3 pg (ref 26.0–34.0)
MCH: 29.1 pg (ref 26.0–34.0)
MCHC: 32.8 g/dL (ref 30.0–36.0)
MCHC: 32.9 g/dL (ref 30.0–36.0)
MCV: 89.2 fL (ref 80.0–100.0)
MCV: 88.5 fL (ref 80.0–100.0)
Platelets: 157 10*3/uL (ref 150–400)
Platelets: 167 10*3/uL (ref 150–400)
RBC: 4.27 MIL/uL (ref 3.87–5.11)
RBC: 4.19 MIL/uL (ref 3.87–5.11)
RDW: 15.5 % (ref 11.5–15.5)
RDW: 15.5 % (ref 11.5–15.5)
WBC: 13.2 10*3/uL — ABNORMAL HIGH (ref 4.0–10.5)
WBC: 13.7 10*3/uL — ABNORMAL HIGH (ref 4.0–10.5)
nRBC: 0 % (ref 0.0–0.2)
nRBC: 0 % (ref 0.0–0.2)

## 2022-07-20 LAB — BASIC METABOLIC PANEL
Anion gap: 11 (ref 5–15)
BUN: 35 mg/dL — ABNORMAL HIGH (ref 8–23)
CO2: 20 mmol/L — ABNORMAL LOW (ref 22–32)
Calcium: 8.1 mg/dL — ABNORMAL LOW (ref 8.9–10.3)
Chloride: 98 mmol/L (ref 98–111)
Creatinine, Ser: 2.4 mg/dL — ABNORMAL HIGH (ref 0.44–1.00)
GFR, Estimated: 21 mL/min — ABNORMAL LOW (ref >60)
Glucose, Bld: 113 mg/dL — ABNORMAL HIGH (ref 70–99)
Potassium: 3.2 mmol/L — ABNORMAL LOW (ref 3.5–5.1)
Sodium: 129 mmol/L — ABNORMAL LOW (ref 135–145)

## 2022-07-20 LAB — CULTURE, BLOOD (ROUTINE X 2)

## 2022-07-20 LAB — URINALYSIS, ROUTINE W REFLEX MICROSCOPIC
Bilirubin Urine: NEGATIVE
Glucose, UA: NEGATIVE mg/dL
Ketones, ur: NEGATIVE mg/dL
Nitrite: NEGATIVE
Protein, ur: >=300 mg/dL — AB
Specific Gravity, Urine: 1.017 (ref 1.005–1.030)
pH: 5 (ref 5.0–8.0)

## 2022-07-20 LAB — GLUCOSE, CAPILLARY
Glucose-Capillary: 100 mg/dL — ABNORMAL HIGH (ref 70–99)
Glucose-Capillary: 114 mg/dL — ABNORMAL HIGH (ref 70–99)
Glucose-Capillary: 127 mg/dL — ABNORMAL HIGH (ref 70–99)
Glucose-Capillary: 128 mg/dL — ABNORMAL HIGH (ref 70–99)
Glucose-Capillary: 79 mg/dL (ref 70–99)
Glucose-Capillary: 99 mg/dL (ref 70–99)

## 2022-07-20 LAB — MAGNESIUM
Magnesium: 1.9 mg/dL (ref 1.7–2.4)
Magnesium: 2.3 mg/dL (ref 1.7–2.4)

## 2022-07-20 LAB — C DIFFICILE QUICK SCREEN W PCR REFLEX
C Diff antigen: NEGATIVE
C Diff interpretation: NOT DETECTED
C Diff toxin: NEGATIVE

## 2022-07-20 LAB — MRSA NEXT GEN BY PCR, NASAL: MRSA by PCR Next Gen: NOT DETECTED

## 2022-07-20 LAB — LACTIC ACID, PLASMA
Lactic Acid, Venous: 2.7 mmol/L (ref 0.5–1.9)
Lactic Acid, Venous: 2.1 mmol/L (ref 0.5–1.9)
Lactic Acid, Venous: 2.5 mmol/L (ref 0.5–1.9)
Lactic Acid, Venous: 3 mmol/L (ref 0.5–1.9)

## 2022-07-20 LAB — CREATININE, SERUM
Creatinine, Ser: 2.65 mg/dL — ABNORMAL HIGH (ref 0.44–1.00)
GFR, Estimated: 19 mL/min — ABNORMAL LOW (ref >60)

## 2022-07-20 LAB — HIV ANTIBODY (ROUTINE TESTING W REFLEX): HIV Screen 4th Generation wRfx: NONREACTIVE

## 2022-07-20 MED ORDER — LACTATED RINGERS IV SOLN: INTRAVENOUS | Status: DC

## 2022-07-20 MED ORDER — POTASSIUM CHLORIDE 10 MEQ/100ML IV SOLN
10.0000 meq | INTRAVENOUS | Status: AC
  Administered 2022-07-20 (×4): 10 meq via INTRAVENOUS
  Filled 2022-07-20 (×4): qty 100

## 2022-07-20 MED ORDER — MAGNESIUM SULFATE 2 GM/50ML IV SOLN: 2.0000 g | Freq: Once | INTRAVENOUS | Status: DC

## 2022-07-20 MED ORDER — POLYETHYLENE GLYCOL 3350 17 G PO PACK: 17.0000 g | PACK | Freq: Every day | ORAL | Status: DC | PRN

## 2022-07-20 MED ORDER — CHLORHEXIDINE GLUCONATE CLOTH 2 % EX PADS
6.0000 | MEDICATED_PAD | Freq: Every day | CUTANEOUS | Status: DC
  Administered 2022-07-20 – 2022-07-24 (×4): 6 via TOPICAL

## 2022-07-20 MED ORDER — POTASSIUM CHLORIDE 20 MEQ PO PACK
40.0000 meq | PACK | Freq: Two times a day (BID) | ORAL | Status: DC
  Administered 2022-07-20 (×2): 40 meq via ORAL
  Filled 2022-07-20 (×2): qty 2

## 2022-07-20 MED ORDER — LACTATED RINGERS IV BOLUS
1000.0000 mL | Freq: Once | INTRAVENOUS | Status: AC
  Administered 2022-07-20: 1000 mL via INTRAVENOUS

## 2022-07-20 MED ORDER — ORAL CARE MOUTH RINSE: 15.0000 mL | OROMUCOSAL | Status: DC | PRN

## 2022-07-20 MED ORDER — ALBUTEROL SULFATE (2.5 MG/3ML) 0.083% IN NEBU: 2.5000 mg | INHALATION_SOLUTION | RESPIRATORY_TRACT | Status: DC | PRN

## 2022-07-20 MED ORDER — ACETAMINOPHEN 325 MG PO TABS
650.0000 mg | ORAL_TABLET | ORAL | Status: DC | PRN
  Administered 2022-07-20 – 2022-07-22 (×2): 650 mg via ORAL
  Filled 2022-07-20 (×2): qty 2

## 2022-07-20 MED ORDER — LOPERAMIDE HCL 2 MG PO CAPS
2.0000 mg | ORAL_CAPSULE | ORAL | Status: DC | PRN
  Administered 2022-07-20 – 2022-07-21 (×2): 2 mg via ORAL
  Filled 2022-07-20 (×2): qty 1

## 2022-07-20 MED ORDER — PANTOPRAZOLE SODIUM 40 MG PO TBEC
40.0000 mg | DELAYED_RELEASE_TABLET | Freq: Two times a day (BID) | ORAL | Status: DC
  Administered 2022-07-20 – 2022-07-24 (×10): 40 mg via ORAL
  Filled 2022-07-20 (×10): qty 1

## 2022-07-20 MED ORDER — PRAMIPEXOLE DIHYDROCHLORIDE 0.25 MG PO TABS
0.7500 mg | ORAL_TABLET | Freq: Every day | ORAL | Status: DC
  Administered 2022-07-20 – 2022-07-23 (×4): 0.75 mg via ORAL
  Filled 2022-07-20 (×6): qty 3

## 2022-07-20 MED ORDER — POTASSIUM CHLORIDE 20 MEQ PO PACK
40.0000 meq | PACK | Freq: Once | ORAL | Status: AC
  Administered 2022-07-20: 40 meq via ORAL
  Filled 2022-07-20: qty 2

## 2022-07-20 MED ORDER — THIAMINE HCL 100 MG/ML IJ SOLN
500.0000 mg | INTRAVENOUS | Status: AC
  Administered 2022-07-20 – 2022-07-22 (×3): 500 mg via INTRAVENOUS
  Filled 2022-07-20 (×3): qty 5

## 2022-07-20 MED ORDER — HEPARIN SODIUM (PORCINE) 5000 UNIT/ML IJ SOLN
5000.0000 [IU] | Freq: Three times a day (TID) | INTRAMUSCULAR | Status: DC
  Administered 2022-07-20 – 2022-07-24 (×14): 5000 [IU] via SUBCUTANEOUS
  Filled 2022-07-20 (×16): qty 1

## 2022-07-20 MED ORDER — MAGNESIUM SULFATE 2 GM/50ML IV SOLN
2.0000 g | Freq: Once | INTRAVENOUS | Status: AC
  Administered 2022-07-20: 2 g via INTRAVENOUS
  Filled 2022-07-20: qty 50

## 2022-07-20 MED ORDER — SODIUM CHLORIDE 0.9 % IV SOLN
250.0000 mL | INTRAVENOUS | Status: DC
  Administered 2022-07-20: 250 mL via INTRAVENOUS

## 2022-07-20 MED ORDER — NOREPINEPHRINE 4 MG/250ML-% IV SOLN
2.0000 ug/min | INTRAVENOUS | Status: DC
  Administered 2022-07-20: 5 ug/min via INTRAVENOUS

## 2022-07-20 NOTE — Progress Notes (Signed)
eLink Physician-Brief Progress Note Patient Name: [name] [name] DOB: Jun 22, 1955 MRN: [id number]   Date of Service  07/20/2022  HPI/Events of Note  67 year old female with a history of essential hypertension, obstructive sleep apnea and urinary tract infections that presented with pyelonephritis and shock.  She was found to have E. coli bacteremia and acute kidney injury.  She states that she would like to be full code.  She would like as needed Imodium and Mirapex.Marland Kitchen  eICU Interventions  I personally discussed CODE STATUS with her, she states that she would like to be full code.  We talked about the risks, benefits and alternatives of intervention and she affirms this decision.  Has a history of chronic bowel irritability and is chronically on Imodium as needed.  Requesting this.  She had 4 bowel movements today, not watery just loose.  No new fevers, minimal leukocytosis.  Will add Imodium for now.  Resume home Mirapex     Intervention Category Intermediate Interventions: Communication with other healthcare providers and/or family  Conrad [place] 07/20/2022, 7:58 PM

## 2022-07-20 NOTE — Progress Notes (Signed)
An USGPIV (ultrasound guided PIV) has been placed for short-term vasopressor infusion. A correctly placed ivWatch must be used when administering Vasopressors. Should this treatment be needed beyond 72 hours, central line access should be obtained.  It will be the responsibility of the bedside nurse to follow best practice to prevent extravasations.   

## 2022-07-20 NOTE — Progress Notes (Signed)
eLink Physician-Brief Progress Note Patient Name: [name] [name] DOB: 11-Apr-1955 MRN: [id number]   Date of Service  07/20/2022  HPI/Events of Note  67 year old female in ICU with UTI and septic shock from urosepsis.  In acute renal failure with lactic acidosis which is improving with volume resuscitation. A/V exam done.  Patient without complaints.  eICU Interventions  Continue IV fluid resuscitation. Pressors as needed. Antibiotics for UTI.        Carilyn Goodpasture 07/20/2022, 6:57 AM

## 2022-07-20 NOTE — Progress Notes (Addendum)
NAME:  [name] [name], MRN:  [id number], DOB:  1955/07/24, LOS: 0 ADMISSION DATE:  07/19/2022, CONSULTATION DATE:  4/22 REFERRING MD:  Dr. Eudelia Bunch, CHIEF COMPLAINT:  urosepsis w/ pyelonephritis   History of Present Illness:  Patient is a 67 year old female with pertinent PMH HTN, OSA, overactive bladder disorder, RLL, depression presents to [facility] ED on 4/21 with urosepsis.  Patient was recently diagnosed with UTI and has been on Macrobid for the past couple days.  Since then she has been having episodes of fever and generalized fatigue.  Also states she has noticed increased urinary frequency and burning with urination.  Patient on 4/21 was also having left-sided back pain and lower abdominal pain.  Patient states she has had poor appetite.  Denies any N/V/D.  Came to [facility] ED for further eval.  On arrival to [facility] ED, patient afebrile and tachycardia 110s.  Patient hypotensive 70/31.  Patient given IV fluids and was started on Levophed.  BC x 2 obtained and patient started on Rocephin for likely UTI.  CXR with no active disease.  CT ABD/pelvis showing no renal stone; possible left pyelonephritis.  PCCM consulted for ICU admission.  Pertinent ED labs: BMET pending LA 2.5, WBC 13.2   Pertinent  Medical History   Past Medical History:  Diagnosis Date   Allergic rhinitis    Arthritis    Depression    Factor V Leiden carrier 08/26/2011   Fatty liver    Fibroids    dysfuction uterine bleeding   GERD (gastroesophageal reflux disease)    Hypertension    Osteoarthritis    Peripheral neuropathy    Psoriatic arthritis    Restless leg syndrome      Significant Hospital Events: Including procedures, antibiotic start and stop dates in addition to other pertinent events   4/22 admit to [facility] w/ urosepsis on levo 4/22  weaning from levo, slight bump in lactate, WBC slight deceased  Interim History / Subjective:  Overnight Lactate increased overnight to 2.5 from 2.1 , + 2400 cc's, UA with moderate  HGB , + protein, small leukocytes, Many bacteria, C diff negative Mag 2.1 ( repleted)  T Max 100.2, Remains on Levophed at 4 mcg/min>> weaning  BMET reviewed K 3.2, Na 129,Mag 2.3, BUN 35, Creatinine 2.40, CO2 20, GAP 11 HGB 12.2, WBC 13.7, Platelets 167  Objective   Blood pressure 99/86, pulse 97, temperature 100.2 F (37.9 C), temperature source Oral, resp. rate 19, height  (1.626 m), weight 116.4 kg, SpO2 99 %.        Intake/Output Summary (Last 24 hours) at 07/20/2022 0740 Last data filed at 07/20/2022 0557 Gross per 24 hour  Intake 2437.56 ml  Output --  Net 2437.56 ml    Filed Weights   07/19/22 2111 07/20/22 0500  Weight: 116.6 kg 116.4 kg    Examination: General: NAD, awake and alert HEENT: MM pink/dry; [place] in place Neuro: Aox3; MAE,totally appropriate CV: s1s2, sinus rhythm to ST tachy 89-105, no m/r/g PULM:  dim per bases, clear BS bilaterally; [place] 1L GI: soft, diffuse b/l abd tenderness , + flank pain on palpation; bsx4 active  Extremities: warm/dry, no edema  Skin: no rashes or lesions   BMET reviewed K 3.2, Na 129,Mag 2.3, BUN 35, Creatinine 2.40, CO2 20, GAP 11 HGB 12.2, WBC 13.7, Platelets 167  Lactate  continues to climb , however she is clinically better. She does have a history of fatty liver disease. Will add thiamine 500 mg daily x  3 days , and trend lactate until clear Resolved Hospital Problem list     Assessment & Plan:  Septic shock UTI with L sided pyelonephritis: recent uti on macrobid Plan: -Admit to ICU with continuous telemetry -Wean levo for MAP goal greater than 65 -IV fluids, continue at 125/ hr for now as bump in lactate -Trend LA -Continue Rocephin -F/U cultures -Trend WBC/fever curve - Urine Culture done  by PCP Friday 4/19>> pharmacy will call [facility] once open this morning to see if they have any results ( They should get results prior to hospital lab , as done several days earlier)  AKI on CKD 3A Hyponatremia: Likely  hypovolemia Hypokalemia AGMA Plan: -IV fluids -Replete K with mag; check mag -Trend BMP / urinary output -Replace electrolytes as indicated -Avoid nephrotoxic agents, ensure adequate renal perfusion - Strict I&O - Consider renal US if renal function does not show improvement   OSA -seen by Dr. Vassie Loll recommended cpap but not wearing at home Plan: -cpap qhs - Will need follow up OP with [facility] Load   HTN Plan: -Hold home antihypertensives  OAB Plan: -Hold home Myrbetriq  RLL Peripheral neuropathy Plan: -Hold home ropinirole, Lyrica, and Mirapex; consider resuming with renal dose adjustment  GERD Plan: -ppi  Best Practice (right click and "Reselect all SmartList Selections" daily)   Diet/type: clear liquids DVT prophylaxis: prophylactic heparin  GI prophylaxis: PPI Lines: N/A Foley:  Yes, and it is still needed Code Status:  DNR Last date of multidisciplinary goals of care discussion [4/22 patient spoke with Dr. Gaynell Face and would like to be DNR.]  Labs   CBC: Recent Labs  Lab 07/19/22 2116 07/20/22 0138  WBC 15.1* 13.2*  NEUTROABS 12.9*  --   HGB 14.3 12.5  HCT 42.4 38.1  MCV 88.1 89.2  PLT 190 157     Basic Metabolic Panel: Recent Labs  Lab 07/19/22 2116 07/20/22 0138  NA 128*  --   K 3.1*  --   CL 93*  --   CO2 19*  --   GLUCOSE 175*  --   BUN 33*  --   CREATININE 2.76* 2.65*  CALCIUM 8.6*  --   MG  --  1.9    GFR: Estimated Creatinine Clearance: 25.1 mL/min (A) (by C-G formula based on SCr of 2.65 mg/dL (H)). Recent Labs  Lab 07/19/22 2116 07/19/22 2314 07/20/22 0138 07/20/22 0309  WBC 15.1*  --  13.2*  --   LATICACIDVEN 5.2* 2.7* 2.1* 2.5*     Liver Function Tests: Recent Labs  Lab 07/19/22 2116  AST 60*  ALT 38  ALKPHOS 88  BILITOT 1.4*  PROT 6.7  ALBUMIN 2.8*    No results for input(s): "LIPASE", "AMYLASE" in the last 168 hours. No results for input(s): "AMMONIA" in the last 168 hours.  ABG    Component Value  Date/Time   TCO2 23 10/08/2013 1333     Coagulation Profile: Recent Labs  Lab 07/19/22 2116  INR 1.3*     Cardiac Enzymes: No results for input(s): "CKTOTAL", "CKMB", "CKMBINDEX", "TROPONINI" in the last 168 hours.  HbA1C: No results found for: "HGBA1C"  CBG: Recent Labs  Lab 07/20/22 0636  GLUCAP 128*     Allergies Allergies  Allergen Reactions   Codeine Nausea Only   Diclofenac Sodium Other (See Comments)    Worsens RLS   Flonase [Fluticasone] Itching     Home Medications  Prior to Admission medications   Medication Sig Start Date End Date  Taking? Authorizing Provider  acetaminophen (TYLENOL) 325 MG tablet Take 650 mg by mouth every 6 (six) hours as needed for mild pain.    [provider]  albuterol (VENTOLIN HFA) 108 (90 Base) MCG/ACT inhaler Inhale 2 puffs into the lungs every 6 (six) hours as needed for shortness of breath. 03/28/21   Omar Person, MD  azelastine (ASTELIN) 0.1 % nasal spray Place 1 spray into both nostrils 2 (two) times daily. Use in each nostril as directed 10/25/19   Glenford Bayley, NP  cetirizine (ZYRTEC) 10 MG tablet Take 10 mg by mouth as needed for allergies.    [provider]  cholecalciferol (VITAMIN D3) 25 MCG (1000 UNIT) tablet Take 1,000 Units by mouth daily.    [provider]  Ixekizumab (TALTZ) 80 MG/ML SOAJ Inject 80 mg as directed.    [provider]  losartan-hydrochlorothiazide (HYZAAR) 50-12.5 MG tablet Take 1 tablet by mouth daily.  07/07/18   [provider]  MYRBETRIQ 50 MG TB24 tablet Take 50 mg by mouth daily.  03/29/17   [provider]  pantoprazole (PROTONIX) 40 MG tablet Take 40 mg by mouth 2 (two) times daily.    [provider]  potassium chloride SA (K-DUR) 20 MEQ tablet Take 1 tablet (20 mEq total) by mouth daily. Take one tablet twice a day for five days, then one tablet once a day Patient taking differently: Take 10 mEq by mouth daily. 08/02/18    Dione Booze, MD  pramipexole (MIRAPEX) 0.75 MG tablet Take 0.75 mg by mouth 3 (three) times daily.    [provider]  pregabalin (LYRICA) 150 MG capsule Take 150 mg by mouth 2 (two) times daily. 01/11/20   [provider]  pregabalin (LYRICA) 75 MG capsule Take 75 mg by mouth at bedtime. In addition to the 150 mg bid    [provider]  rOPINIRole (REQUIP) 2 MG tablet Take 1 tablet (2 mg total) by mouth at bedtime. 05/02/18 08/02/27  Lomax, Amy, NP  traMADol (ULTRAM) 50 MG tablet Take 1 tablet (50 mg total) by mouth 3 (three) times daily as needed. 05/13/21   Cristie Hem, PA-C     Critical care time: Additional 20 minutes    Bevelyn Ngo, MSN, AGACNP-BC [facility] Pulmonary/Critical Care Medicine See Amion for personal pager PCCM on call pager [phone]  07/20/2022, 7:40 AM  From 7A-7P if no response, please call [phone]. After hours, please call ELink [phone].

## 2022-07-20 NOTE — H&P (Signed)
NAME:  [name] [name], MRN:  [id number], DOB:  [birth year], LOS: 0 ADMISSION DATE:  07/19/2022, CONSULTATION DATE:  4/22 REFERRING MD:  Dr. Eudelia Bunch, CHIEF COMPLAINT:  urosepsis w/ pyelonephritis   History of Present Illness:  Patient is a 67 year old female with pertinent PMH HTN, OSA, overactive bladder disorder, RLL, depression presents to [facility] ED on 4/21 with urosepsis.  Patient was recently diagnosed with UTI and has been on Macrobid for the past couple days.  Since then she has been having episodes of fever and generalized fatigue.  Also states she has noticed increased urinary frequency and burning with urination.  Patient on 4/21 was also having left-sided back pain and lower abdominal pain.  Patient states she has had poor appetite.  Denies any N/V/D.  Came to [facility] ED for further eval.  On arrival to [facility] ED, patient afebrile and tachycardia 110s.  Patient hypotensive 70/31.  Patient given IV fluids and was started on Levophed.  BC x 2 obtained and patient started on Rocephin for likely UTI.  CXR with no active disease.  CT ABD/pelvis showing no renal stone; possible left pyelonephritis.  PCCM consulted for ICU admission.  Pertinent ED labs: NA 128, K3.1, CO2 19, creat 2.76, AG 16, LA 5.2, WBC 15  Pertinent  Medical History   Past Medical History:  Diagnosis Date   Allergic rhinitis    Arthritis    Depression    Factor V Leiden carrier 08/26/2011   Fatty liver    Fibroids    dysfuction uterine bleeding   GERD (gastroesophageal reflux disease)    Hypertension    Osteoarthritis    Peripheral neuropathy    Psoriatic arthritis    Restless leg syndrome      Significant Hospital Events: Including procedures, antibiotic start and stop dates in addition to other pertinent events   4/22 admit to [facility] w/ urosepsis on levo  Interim History / Subjective:  See above  Objective   Blood pressure (!) 108/55, pulse 92, temperature 97.9 F (36.6 C), resp. rate 19, height  (1.626  m), weight 116.6 kg, SpO2 100 %.        Intake/Output Summary (Last 24 hours) at 07/20/2022 0010 Last data filed at 07/19/2022 2312 Gross per 24 hour  Intake 2097.05 ml  Output --  Net 2097.05 ml   Filed Weights   07/19/22 2111  Weight: 116.6 kg    Examination: General: NAD HEENT: MM pink/dry; [place] in place Neuro: Aox3; MAE CV: s1s2, sinus tachy 110s, no m/r/g PULM:  dim clear BS bilaterally; [place] 1L GI: soft, diffuse b/l abd tenderness on palpation; bsx4 active  Extremities: warm/dry, no edema  Skin: no rashes or lesions    Resolved Hospital Problem list     Assessment & Plan:  Septic shock UTI with L sided pyelonephritis: recent uti on macrobid Plan: -Admit to ICU with continuous telemetry -Wean levo for MAP goal greater than 65 -IV fluids -Trend LA -Continue Rocephin -F/U cultures -Trend WBC/fever curve  AKI on CKD 3A Hyponatremia: Likely hypovolemia Hypokalemia AGMA Plan: -IV fluids -Replete K with mag; check mag -Trend BMP / urinary output -Replace electrolytes as indicated -Avoid nephrotoxic agents, ensure adequate renal perfusion  OSA -seen by Dr. Vassie Loll recommended cpap but not wearing at home Plan: -cpap qhs  HTN Plan: -Hold home antihypertensives  OAB Plan: -Hold home Myrbetriq  RLL Peripheral neuropathy Plan: -Hold home ropinirole, Lyrica, and Mirapex; consider resuming with renal dose adjustment  GERD Plan: -ppi  Best  Practice (right click and "Reselect all SmartList Selections" daily)   Diet/type: clear liquids DVT prophylaxis: prophylactic heparin  GI prophylaxis: PPI Lines: N/A Foley:  Yes, and it is still needed Code Status:  DNR Last date of multidisciplinary goals of care discussion [4/22 patient spoke with Dr. Gaynell Face and would like to be DNR.]  Labs   CBC: Recent Labs  Lab 07/19/22 2116  WBC 15.1*  NEUTROABS 12.9*  HGB 14.3  HCT 42.4  MCV 88.1  PLT 190    Basic Metabolic Panel: Recent Labs  Lab  07/19/22 2116  NA 128*  K 3.1*  CL 93*  CO2 19*  GLUCOSE 175*  BUN 33*  CREATININE 2.76*  CALCIUM 8.6*   GFR: Estimated Creatinine Clearance: 24.1 mL/min (A) (by C-G formula based on SCr of 2.76 mg/dL (H)). Recent Labs  Lab 07/19/22 2116  WBC 15.1*  LATICACIDVEN 5.2*    Liver Function Tests: Recent Labs  Lab 07/19/22 2116  AST 60*  ALT 38  ALKPHOS 88  BILITOT 1.4*  PROT 6.7  ALBUMIN 2.8*   No results for input(s): "LIPASE", "AMYLASE" in the last 168 hours. No results for input(s): "AMMONIA" in the last 168 hours.  ABG    Component Value Date/Time   TCO2 23 10/08/2013 1333     Coagulation Profile: Recent Labs  Lab 07/19/22 2116  INR 1.3*    Cardiac Enzymes: No results for input(s): "CKTOTAL", "CKMB", "CKMBINDEX", "TROPONINI" in the last 168 hours.  HbA1C: No results found for: "HGBA1C"  CBG: No results for input(s): "GLUCAP" in the last 168 hours.  Review of Systems:   Review of Systems  Constitutional:  Positive for fever and malaise/fatigue.  Respiratory:  Negative for cough, shortness of breath and wheezing.   Cardiovascular:  Negative for chest pain.  Gastrointestinal:  Positive for abdominal pain. Negative for constipation, diarrhea, nausea and vomiting.  Genitourinary:  Positive for dysuria, flank pain, frequency and urgency.     Past Medical History:  She,  has a past medical history of Allergic rhinitis, Arthritis, Depression, Factor V Leiden carrier (08/26/2011), Fatty liver, Fibroids, GERD (gastroesophageal reflux disease), Hypertension, Osteoarthritis, Peripheral neuropathy, Psoriatic arthritis, and Restless leg syndrome.   Surgical History:   Past Surgical History:  Procedure Laterality Date   CHOLECYSTECTOMY     KNEE SURGERY     right TKR   LUMBAR SPINE SURGERY     TUBAL LIGATION     bilateral   URETHRAL SLING     VAGINAL HYSTERECTOMY       Social History:   reports that she has never smoked. She has never used smokeless  tobacco. She reports that she does not drink alcohol and does not use drugs.   Family History:  Her family history includes Cancer in her sister; Coronary artery disease (age of onset: 36) in her father; Deep vein thrombosis in her mother; Heart failure in her maternal grandmother; Hypertension (age of onset: 78) in her mother; Liver disease in her sister; Neuropathy in her mother; Osteoporosis in her mother; Other in her sister; Stroke in her father.   Allergies Allergies  Allergen Reactions   Codeine Nausea Only   Diclofenac Sodium Other (See Comments)    Worsens RLS   Flonase [Fluticasone] Itching     Home Medications  Prior to Admission medications   Medication Sig Start Date End Date Taking? Authorizing Provider  acetaminophen (TYLENOL) 325 MG tablet Take 650 mg by mouth every 6 (six) hours as needed for mild pain.  [provider]  albuterol (VENTOLIN HFA) 108 (90 Base) MCG/ACT inhaler Inhale 2 puffs into the lungs every 6 (six) hours as needed for shortness of breath. 03/28/21   Omar Person, MD  azelastine (ASTELIN) 0.1 % nasal spray Place 1 spray into both nostrils 2 (two) times daily. Use in each nostril as directed 10/25/19   Glenford Bayley, NP  cetirizine (ZYRTEC) 10 MG tablet Take 10 mg by mouth as needed for allergies.    [provider]  cholecalciferol (VITAMIN D3) 25 MCG (1000 UNIT) tablet Take 1,000 Units by mouth daily.    [provider]  Ixekizumab (TALTZ) 80 MG/ML SOAJ Inject 80 mg as directed.    [provider]  losartan-hydrochlorothiazide (HYZAAR) 50-12.5 MG tablet Take 1 tablet by mouth daily.  07/07/18   [provider]  MYRBETRIQ 50 MG TB24 tablet Take 50 mg by mouth daily.  03/29/17   [provider]  pantoprazole (PROTONIX) 40 MG tablet Take 40 mg by mouth 2 (two) times daily.    [provider]  potassium chloride SA (K-DUR) 20 MEQ tablet Take 1 tablet (20 mEq total) by mouth daily. Take  one tablet twice a day for five days, then one tablet once a day Patient taking differently: Take 10 mEq by mouth daily. 08/02/18   Dione Booze, MD  pramipexole (MIRAPEX) 0.75 MG tablet Take 0.75 mg by mouth 3 (three) times daily.    [provider]  pregabalin (LYRICA) 150 MG capsule Take 150 mg by mouth 2 (two) times daily. 01/11/20   [provider]  pregabalin (LYRICA) 75 MG capsule Take 75 mg by mouth at bedtime. In addition to the 150 mg bid    [provider]  rOPINIRole (REQUIP) 2 MG tablet Take 1 tablet (2 mg total) by mouth at bedtime. 05/02/18 08/02/27  Lomax, Amy, NP  traMADol (ULTRAM) 50 MG tablet Take 1 tablet (50 mg total) by mouth 3 (three) times daily as needed. 05/13/21   Cristie Hem, PA-C     Critical care time: 45 minutes    JD Anselm Lis [place] [facility] 07/20/2022, 12:10 AM  Please see [email or web address] for pager details.  From 7A-7P if no response, please call [phone]. After hours, please call ELink [phone].

## 2022-07-20 NOTE — Progress Notes (Signed)
PHARMACY - PHYSICIAN COMMUNICATION CRITICAL VALUE ALERT - BLOOD CULTURE IDENTIFICATION (BCID)  [name] [name] is an 67 y.o. female who presented to [facility] on 07/19/2022 with a chief complaint of sepsis.   Assessment:  1/3 bottles, E.Coli suspected urinary source   Name of physician (or Provider) Contacted: Kandice Robinsons, NP   Current antibiotics: Ceftriaxone 2g Q24H   Changes to prescribed antibiotics recommended:  Patient is on recommended antibiotics - No changes needed  Results for orders placed or performed during the hospital encounter of 07/19/22  Blood Culture ID Panel (Reflexed) (Collected: 07/19/2022  9:16 PM)  Result Value Ref Range   Enterococcus faecalis NOT DETECTED NOT DETECTED   Enterococcus Faecium NOT DETECTED NOT DETECTED   Listeria monocytogenes NOT DETECTED NOT DETECTED   Staphylococcus species NOT DETECTED NOT DETECTED   Staphylococcus aureus (BCID) NOT DETECTED NOT DETECTED   Staphylococcus epidermidis NOT DETECTED NOT DETECTED   Staphylococcus lugdunensis NOT DETECTED NOT DETECTED   Streptococcus species NOT DETECTED NOT DETECTED   Streptococcus agalactiae NOT DETECTED NOT DETECTED   Streptococcus pneumoniae NOT DETECTED NOT DETECTED   Streptococcus pyogenes NOT DETECTED NOT DETECTED   A.calcoaceticus-baumannii NOT DETECTED NOT DETECTED   Bacteroides fragilis NOT DETECTED NOT DETECTED   Enterobacterales DETECTED (A) NOT DETECTED   Enterobacter cloacae complex NOT DETECTED NOT DETECTED   Escherichia coli DETECTED (A) NOT DETECTED   Klebsiella aerogenes NOT DETECTED NOT DETECTED   Klebsiella oxytoca NOT DETECTED NOT DETECTED   Klebsiella pneumoniae NOT DETECTED NOT DETECTED   Proteus species NOT DETECTED NOT DETECTED   Salmonella species NOT DETECTED NOT DETECTED   Serratia marcescens NOT DETECTED NOT DETECTED   Haemophilus influenzae NOT DETECTED NOT DETECTED   Neisseria meningitidis NOT DETECTED NOT DETECTED   Pseudomonas aeruginosa NOT DETECTED NOT  DETECTED   Stenotrophomonas maltophilia NOT DETECTED NOT DETECTED   Candida albicans NOT DETECTED NOT DETECTED   Candida auris NOT DETECTED NOT DETECTED   Candida glabrata NOT DETECTED NOT DETECTED   Candida krusei NOT DETECTED NOT DETECTED   Candida parapsilosis NOT DETECTED NOT DETECTED   Candida tropicalis NOT DETECTED NOT DETECTED   Cryptococcus neoformans/gattii NOT DETECTED NOT DETECTED   CTX-M ESBL NOT DETECTED NOT DETECTED   Carbapenem resistance IMP NOT DETECTED NOT DETECTED   Carbapenem resistance KPC NOT DETECTED NOT DETECTED   Carbapenem resistance NDM NOT DETECTED NOT DETECTED   Carbapenem resist OXA 48 LIKE NOT DETECTED NOT DETECTED   Carbapenem resistance VIM NOT DETECTED NOT DETECTED    Estill Batten, PharmD, BCCCP  07/20/2022  11:12 AM

## 2022-07-21 DIAGNOSIS — A419 Sepsis, unspecified organism: Secondary | ICD-10-CM | POA: Diagnosis not present

## 2022-07-21 DIAGNOSIS — R6521 Severe sepsis with septic shock: Secondary | ICD-10-CM | POA: Diagnosis not present

## 2022-07-21 LAB — CBC WITH DIFFERENTIAL/PLATELET
Abs Immature Granulocytes: 0.04 10*3/uL (ref 0.00–0.07)
Basophils Absolute: 0 10*3/uL (ref 0.0–0.1)
Basophils Relative: 0 %
Eosinophils Absolute: 0 10*3/uL (ref 0.0–0.5)
Eosinophils Relative: 1 %
HCT: 32.2 % — ABNORMAL LOW (ref 36.0–46.0)
Hemoglobin: 10.4 g/dL — ABNORMAL LOW (ref 12.0–15.0)
Immature Granulocytes: 1 %
Lymphocytes Relative: 9 %
Lymphs Abs: 0.6 10*3/uL — ABNORMAL LOW (ref 0.7–4.0)
MCH: 29 pg (ref 26.0–34.0)
MCHC: 32.3 g/dL (ref 30.0–36.0)
MCV: 89.7 fL (ref 80.0–100.0)
Monocytes Absolute: 0.9 10*3/uL (ref 0.1–1.0)
Monocytes Relative: 14 %
Neutro Abs: 4.7 10*3/uL (ref 1.7–7.7)
Neutrophils Relative %: 75 %
Platelets: 119 10*3/uL — ABNORMAL LOW (ref 150–400)
RBC: 3.59 MIL/uL — ABNORMAL LOW (ref 3.87–5.11)
RDW: 15.4 % (ref 11.5–15.5)
WBC: 6.3 10*3/uL (ref 4.0–10.5)
nRBC: 0 % (ref 0.0–0.2)

## 2022-07-21 LAB — COMPREHENSIVE METABOLIC PANEL
ALT: 34 U/L (ref 0–44)
AST: 43 U/L — ABNORMAL HIGH (ref 15–41)
Albumin: 2 g/dL — ABNORMAL LOW (ref 3.5–5.0)
Alkaline Phosphatase: 65 U/L (ref 38–126)
Anion gap: 8 (ref 5–15)
BUN: 31 mg/dL — ABNORMAL HIGH (ref 8–23)
CO2: 21 mmol/L — ABNORMAL LOW (ref 22–32)
Calcium: 8.2 mg/dL — ABNORMAL LOW (ref 8.9–10.3)
Chloride: 101 mmol/L (ref 98–111)
Creatinine, Ser: 1.93 mg/dL — ABNORMAL HIGH (ref 0.44–1.00)
GFR, Estimated: 28 mL/min — ABNORMAL LOW (ref >60)
Glucose, Bld: 112 mg/dL — ABNORMAL HIGH (ref 70–99)
Potassium: 3.9 mmol/L (ref 3.5–5.1)
Sodium: 130 mmol/L — ABNORMAL LOW (ref 135–145)
Total Bilirubin: 0.6 mg/dL (ref 0.3–1.2)
Total Protein: 5.3 g/dL — ABNORMAL LOW (ref 6.5–8.1)

## 2022-07-21 LAB — GLUCOSE, CAPILLARY
Glucose-Capillary: 93 mg/dL (ref 70–99)
Glucose-Capillary: 77 mg/dL (ref 70–99)
Glucose-Capillary: 91 mg/dL (ref 70–99)

## 2022-07-21 LAB — URINE CULTURE: Culture: 20000 — AB

## 2022-07-21 LAB — MAGNESIUM: Magnesium: 2.2 mg/dL (ref 1.7–2.4)

## 2022-07-21 LAB — LACTIC ACID, PLASMA: Lactic Acid, Venous: 0.9 mmol/L (ref 0.5–1.9)

## 2022-07-21 MED ORDER — ONDANSETRON HCL 4 MG/2ML IJ SOLN: 4.0000 mg | Freq: Four times a day (QID) | INTRAMUSCULAR | Status: DC | PRN

## 2022-07-21 MED ORDER — LORATADINE 10 MG PO TABS
10.0000 mg | ORAL_TABLET | Freq: Every day | ORAL | Status: DC
  Administered 2022-07-21 – 2022-07-24 (×4): 10 mg via ORAL
  Filled 2022-07-21 (×4): qty 1

## 2022-07-21 MED ORDER — DULOXETINE HCL 60 MG PO CPEP
60.0000 mg | ORAL_CAPSULE | Freq: Two times a day (BID) | ORAL | Status: DC
  Administered 2022-07-21 – 2022-07-24 (×7): 60 mg via ORAL
  Filled 2022-07-21 (×7): qty 1

## 2022-07-21 MED ORDER — METOPROLOL TARTRATE 5 MG/5ML IV SOLN: 5.0000 mg | INTRAVENOUS | Status: DC | PRN

## 2022-07-21 MED ORDER — VITAMIN D 25 MCG (1000 UNIT) PO TABS
1000.0000 [IU] | ORAL_TABLET | Freq: Every day | ORAL | Status: DC
  Administered 2022-07-21 – 2022-07-24 (×4): 1000 [IU] via ORAL
  Filled 2022-07-21 (×4): qty 1

## 2022-07-21 MED ORDER — POTASSIUM CHLORIDE 20 MEQ PO PACK: 40.0000 meq | PACK | Freq: Two times a day (BID) | ORAL | Status: DC

## 2022-07-21 MED ORDER — TRAZODONE HCL 50 MG PO TABS: 50.0000 mg | ORAL_TABLET | Freq: Every evening | ORAL | Status: DC | PRN

## 2022-07-21 MED ORDER — GUAIFENESIN 100 MG/5ML PO LIQD: 5.0000 mL | ORAL | Status: DC | PRN

## 2022-07-21 MED ORDER — POTASSIUM CHLORIDE CRYS ER 20 MEQ PO TBCR
40.0000 meq | EXTENDED_RELEASE_TABLET | Freq: Two times a day (BID) | ORAL | Status: DC
  Administered 2022-07-21 – 2022-07-24 (×7): 40 meq via ORAL
  Filled 2022-07-21 (×7): qty 2

## 2022-07-21 MED ORDER — PREGABALIN 75 MG PO CAPS: 75.0000 mg | ORAL_CAPSULE | Freq: Every evening | ORAL | Status: DC | PRN

## 2022-07-21 MED ORDER — MIRABEGRON ER 25 MG PO TB24
50.0000 mg | ORAL_TABLET | Freq: Every day | ORAL | Status: DC
  Administered 2022-07-21 – 2022-07-24 (×4): 50 mg via ORAL
  Filled 2022-07-21 (×3): qty 2
  Filled 2022-07-21: qty 1

## 2022-07-21 MED ORDER — SODIUM CHLORIDE 0.9 % IV SOLN: INTRAVENOUS | Status: AC

## 2022-07-21 MED ORDER — PREGABALIN 75 MG PO CAPS
150.0000 mg | ORAL_CAPSULE | Freq: Two times a day (BID) | ORAL | Status: DC
  Administered 2022-07-21 – 2022-07-24 (×7): 150 mg via ORAL
  Filled 2022-07-21: qty 3
  Filled 2022-07-21 (×6): qty 2

## 2022-07-21 MED ORDER — SENNOSIDES-DOCUSATE SODIUM 8.6-50 MG PO TABS: 1.0000 | ORAL_TABLET | Freq: Every evening | ORAL | Status: DC | PRN

## 2022-07-21 MED ORDER — HYDRALAZINE HCL 20 MG/ML IJ SOLN: 10.0000 mg | INTRAMUSCULAR | Status: DC | PRN

## 2022-07-21 NOTE — Progress Notes (Addendum)
PROGRESS NOTE    [name] [name]  ZOX:[id number] DOB: 15-Dec-1955 DOA: 07/19/2022 PCP: Laurann Montana, MD   Brief Narrative:  67 year old with history of HTN, OSA, overactive bladder, depression, allergic rhinitis admitted to the hospital secondary to urosepsis requiring pressors and ICU admission.  CT abdomen pelvis did not show any renal stone but there were concerns of possible left-sided pyelonephritis.   Assessment & Plan:  Principal Problem:   Septic shock Active Problems:   Acute cystitis without hematuria   Septic shock secondary to left-sided pyelonephritis - Now weaned off pressors conventional physiology improving. Rocephin.  Continue IV fluids.  Cultures growing gram-negative rods, likely E. coli  Acute kidney injury on CKD stage II; Baseline Cr 1.1 -Admission creatinine 2.4, today 1.93.  Continue IV fluids  Obstructive sleep apnea -Bedtime CPAP  Essential hypertension -Current meds on hold.  On IV as needed  Peripheral neuropathy -We will slowly resume home medications  GERD -PPI   DVT prophylaxis: Subcu.  Heparin Code Status: Full code Family Communication:  Daughter at bedside Status is: Inpatient We will ensure she remains off pressors today, await culture sensitivity data.  Hopefully home tomorrow Transfer her out of the ICU      Diet Orders (From admission, onward)     Start     Ordered   07/20/22 0008  Diet clear liquid Room service appropriate? Yes; Fluid consistency: Thin  Diet effective now       Question Answer Comment  Room service appropriate? Yes   Fluid consistency: Thin      07/20/22 0009            Subjective: Feeling better today, denies any other complaints   Examination:  General exam: Appears calm and comfortable  Respiratory system: Clear to auscultation. Respiratory effort normal. Cardiovascular system: S1 & S2 heard, RRR. No JVD, murmurs, rubs, gallops or clicks. No pedal edema. Gastrointestinal system:  Abdomen is nondistended, soft and nontender. No organomegaly or masses felt. Normal bowel sounds heard. Central nervous system: Alert and oriented. No focal neurological deficits. Extremities: Symmetric 5 x 5 power. Skin: No rashes, lesions or ulcers Psychiatry: Judgement and insight appear normal. Mood & affect appropriate.  Objective: Vitals:   07/21/22 0500 07/21/22 0606 07/21/22 0700 07/21/22 0721  BP: (!) 118/58 (!) 147/75 129/66   Pulse: 71 81 79   Resp: 20 15 19    Temp:    99.7 F (37.6 C)  TempSrc:    Oral  SpO2: 97% 97% 97%   Weight: 117.7 kg     Height:        Intake/Output Summary (Last 24 hours) at 07/21/2022 0801 Last data filed at 07/21/2022 0700 Gross per 24 hour  Intake 3688.26 ml  Output 1025 ml  Net 2663.26 ml   Filed Weights   07/19/22 2111 07/20/22 0500 07/21/22 0500  Weight: 116.6 kg 116.4 kg 117.7 kg    Scheduled Meds:  Chlorhexidine Gluconate Cloth  6 each Topical Q0600   heparin  5,000 Units Subcutaneous Q8H   pantoprazole  40 mg Oral BID   potassium chloride  40 mEq Oral BID   pramipexole  0.75 mg Oral QHS   Continuous Infusions:  sodium chloride 10 mL/hr at 07/21/22 0700   cefTRIAXone (ROCEPHIN)  IV 2 g (07/20/22 2159)   lactated ringers 125 mL/hr at 07/21/22 0700   norepinephrine (LEVOPHED) Adult infusion Stopped (07/20/22 1033)   thiamine (VITAMIN B1) injection Stopped (07/20/22 1255)    Nutritional status  Body mass index is 44.54 kg/m.  Data Reviewed:   CBC: Recent Labs  Lab 07/19/22 2116 07/20/22 0138 07/20/22 0723 07/21/22 0207  WBC 15.1* 13.2* 13.7* 6.3  NEUTROABS 12.9*  --   --  4.7  HGB 14.3 12.5 12.2 10.4*  HCT 42.4 38.1 37.1 32.2*  MCV 88.1 89.2 88.5 89.7  PLT 190 157 167 119*   Basic Metabolic Panel: Recent Labs  Lab 07/19/22 2116 07/20/22 0138 07/20/22 0723 07/21/22 0207  NA 128*  --  129* 130*  K 3.1*  --  3.2* 3.9  CL 93*  --  98 101  CO2 19*  --  20* 21*  GLUCOSE 175*  --  113* 112*  BUN 33*   --  35* 31*  CREATININE 2.76* 2.65* 2.40* 1.93*  CALCIUM 8.6*  --  8.1* 8.2*  MG  --  1.9 2.3 2.2   GFR: Estimated Creatinine Clearance: 34.7 mL/min (A) (by C-G formula based on SCr of 1.93 mg/dL (H)). Liver Function Tests: Recent Labs  Lab 07/19/22 2116 07/21/22 0207  AST 60* 43*  ALT 38 34  ALKPHOS 88 65  BILITOT 1.4* 0.6  PROT 6.7 5.3*  ALBUMIN 2.8* 2.0*   No results for input(s): "LIPASE", "AMYLASE" in the last 168 hours. No results for input(s): "AMMONIA" in the last 168 hours. Coagulation Profile: Recent Labs  Lab 07/19/22 2116  INR 1.3*   Cardiac Enzymes: No results for input(s): "CKTOTAL", "CKMB", "CKMBINDEX", "TROPONINI" in the last 168 hours. BNP (last 3 results) No results for input(s): "PROBNP" in the last 8760 hours. HbA1C: No results for input(s): "HGBA1C" in the last 72 hours. CBG: Recent Labs  Lab 07/20/22 1655 07/20/22 1923 07/20/22 2335 07/21/22 0356 07/21/22 0720  GLUCAP 79 127* 100* 93 77   Lipid Profile: No results for input(s): "CHOL", "HDL", "LDLCALC", "TRIG", "CHOLHDL", "LDLDIRECT" in the last 72 hours. Thyroid Function Tests: No results for input(s): "TSH", "T4TOTAL", "FREET4", "T3FREE", "THYROIDAB" in the last 72 hours. Anemia Panel: No results for input(s): "VITAMINB12", "FOLATE", "FERRITIN", "TIBC", "IRON", "RETICCTPCT" in the last 72 hours. Sepsis Labs: Recent Labs  Lab 07/20/22 0138 07/20/22 0309 07/20/22 0838 07/21/22 0207  LATICACIDVEN 2.1* 2.5* 3.0* 0.9    Recent Results (from the past 240 hour(s))  Culture, blood (Routine x 2)     Status: None (Preliminary result)   Collection Time: 07/19/22  9:16 PM   Specimen: BLOOD  Result Value Ref Range Status   Specimen Description BLOOD BLOOD RIGHT ARM  Final   Special Requests   Final    BOTTLES DRAWN AEROBIC AND ANAEROBIC Blood Culture results may not be optimal due to an excessive volume of blood received in culture bottles   Culture  Setup Time   Final    GRAM NEGATIVE  RODS IN BOTH AEROBIC AND ANAEROBIC BOTTLES CRITICAL RESULT CALLED TO, READ BACK BY AND VERIFIED WITH:  C/ PHARMD A. PAYTES 07/20/22 1102 A. LAFRANCE Performed at [facility], [address], [place], [place] [address]    Culture GRAM NEGATIVE RODS  Final   Report Status PENDING  Incomplete  Culture, blood (Routine x 2)     Status: None (Preliminary result)   Collection Time: 07/19/22  9:16 PM   Specimen: BLOOD  Result Value Ref Range Status   Specimen Description BLOOD BLOOD LEFT HAND  Final   Special Requests   Final    BOTTLES DRAWN AEROBIC ONLY Blood Culture adequate volume   Culture  Setup Time  Final    AEROBIC BOTTLE ONLY GRAM NEGATIVE RODS CRITICAL VALUE NOTED.  VALUE IS CONSISTENT WITH PREVIOUSLY REPORTED AND CALLED VALUE. Performed at [facility], [address], [place], [place] [address]    Culture GRAM NEGATIVE RODS  Final   Report Status PENDING  Incomplete  Blood Culture ID Panel (Reflexed)     Status: Abnormal   Collection Time: 07/19/22  9:16 PM  Result Value Ref Range Status   Enterococcus faecalis NOT DETECTED NOT DETECTED Final   Enterococcus Faecium NOT DETECTED NOT DETECTED Final   Listeria monocytogenes NOT DETECTED NOT DETECTED Final   Staphylococcus species NOT DETECTED NOT DETECTED Final   Staphylococcus aureus (BCID) NOT DETECTED NOT DETECTED Final   Staphylococcus epidermidis NOT DETECTED NOT DETECTED Final   Staphylococcus lugdunensis NOT DETECTED NOT DETECTED Final   Streptococcus species NOT DETECTED NOT DETECTED Final   Streptococcus agalactiae NOT DETECTED NOT DETECTED Final   Streptococcus pneumoniae NOT DETECTED NOT DETECTED Final   Streptococcus pyogenes NOT DETECTED NOT DETECTED Final   A.calcoaceticus-baumannii NOT DETECTED NOT DETECTED Final   Bacteroides fragilis NOT DETECTED NOT DETECTED Final   Enterobacterales DETECTED (A) NOT DETECTED Final    Comment: Enterobacterales represent a large order of gram negative bacteria,  not a single organism. CRITICAL RESULT CALLED TO, READ BACK BY AND VERIFIED WITH:  C/ PHARMD A. PAYTES 07/20/22 1102 A. LAFRANCE    Enterobacter cloacae complex NOT DETECTED NOT DETECTED Final   Escherichia coli DETECTED (A) NOT DETECTED Final    Comment: CRITICAL RESULT CALLED TO, READ BACK BY AND VERIFIED WITH:  C/ PHARMD A. PAYTES 07/20/22 1102 A. LAFRANCE    Klebsiella aerogenes NOT DETECTED NOT DETECTED Final   Klebsiella oxytoca NOT DETECTED NOT DETECTED Final   Klebsiella pneumoniae NOT DETECTED NOT DETECTED Final   Proteus species NOT DETECTED NOT DETECTED Final   Salmonella species NOT DETECTED NOT DETECTED Final   Serratia marcescens NOT DETECTED NOT DETECTED Final   Haemophilus influenzae NOT DETECTED NOT DETECTED Final   Neisseria meningitidis NOT DETECTED NOT DETECTED Final   Pseudomonas aeruginosa NOT DETECTED NOT DETECTED Final   Stenotrophomonas maltophilia NOT DETECTED NOT DETECTED Final   Candida albicans NOT DETECTED NOT DETECTED Final   Candida auris NOT DETECTED NOT DETECTED Final   Candida glabrata NOT DETECTED NOT DETECTED Final   Candida krusei NOT DETECTED NOT DETECTED Final   Candida parapsilosis NOT DETECTED NOT DETECTED Final   Candida tropicalis NOT DETECTED NOT DETECTED Final   Cryptococcus neoformans/gattii NOT DETECTED NOT DETECTED Final   CTX-M ESBL NOT DETECTED NOT DETECTED Final   Carbapenem resistance IMP NOT DETECTED NOT DETECTED Final   Carbapenem resistance KPC NOT DETECTED NOT DETECTED Final   Carbapenem resistance NDM NOT DETECTED NOT DETECTED Final   Carbapenem resist OXA 48 LIKE NOT DETECTED NOT DETECTED Final   Carbapenem resistance VIM NOT DETECTED NOT DETECTED Final    Comment: Performed at [facility], [address] [place], [place] [address]  C Difficile Quick Screen w PCR reflex     Status: None   Collection Time: 07/20/22  2:25 AM   Specimen: STOOL  Result Value Ref Range Status   C Diff antigen NEGATIVE NEGATIVE Final    C Diff toxin NEGATIVE NEGATIVE Final   C Diff interpretation No C. difficile detected.  Final    Comment: Performed at [facility], [address], [place], [place] [address]  Gastrointestinal Panel by PCR , Stool  Status: None   Collection Time: 07/20/22  2:25 AM   Specimen: STOOL  Result Value Ref Range Status   Campylobacter species NOT DETECTED NOT DETECTED Final   Plesimonas shigelloides NOT DETECTED NOT DETECTED Final   Salmonella species NOT DETECTED NOT DETECTED Final   Yersinia enterocolitica NOT DETECTED NOT DETECTED Final   Vibrio species NOT DETECTED NOT DETECTED Final   Vibrio cholerae NOT DETECTED NOT DETECTED Final   Enteroaggregative E coli (EAEC) NOT DETECTED NOT DETECTED Final   Enteropathogenic E coli (EPEC) NOT DETECTED NOT DETECTED Final   Enterotoxigenic E coli (ETEC) NOT DETECTED NOT DETECTED Final   Shiga like toxin producing E coli (STEC) NOT DETECTED NOT DETECTED Final   Shigella/Enteroinvasive E coli (EIEC) NOT DETECTED NOT DETECTED Final   Cryptosporidium NOT DETECTED NOT DETECTED Final   Cyclospora cayetanensis NOT DETECTED NOT DETECTED Final   Entamoeba histolytica NOT DETECTED NOT DETECTED Final   Giardia lamblia NOT DETECTED NOT DETECTED Final   Adenovirus F40/41 NOT DETECTED NOT DETECTED Final   Astrovirus NOT DETECTED NOT DETECTED Final   Norovirus GI/GII NOT DETECTED NOT DETECTED Final   Rotavirus A NOT DETECTED NOT DETECTED Final   Sapovirus (I, II, IV, and V) NOT DETECTED NOT DETECTED Final    Comment: Performed at [facility], [address] [place], [place] [address]  MRSA Next Gen by PCR, Nasal     Status: None   Collection Time: 07/20/22  5:58 AM   Specimen: Nasal Mucosa; Nasal Swab  Result Value Ref Range Status   MRSA by PCR Next Gen NOT DETECTED NOT DETECTED Final    Comment: (NOTE) The GeneXpert MRSA Assay (FDA approved for NASAL specimens only), is one component of a comprehensive MRSA colonization  surveillance program. It is not intended to diagnose MRSA infection nor to guide or monitor treatment for MRSA infections. Test performance is not FDA approved in patients less than 3 years old. Performed at [facility], [address] [place], [place] [address]          Radiology Studies: CT Renal Stone Study  Result Date: 07/19/2022 CLINICAL DATA:  Abdominal/flank pain, stone suspected EXAM: CT ABDOMEN AND PELVIS WITHOUT CONTRAST TECHNIQUE: Multidetector CT imaging of the abdomen and pelvis was performed following the standard protocol without IV contrast. RADIATION DOSE REDUCTION: This exam was performed according to the departmental dose-optimization program which includes automated exposure control, adjustment of the mA and/or kV according to patient size and/or use of iterative reconstruction technique. COMPARISON:  CT 02/18/2021 FINDINGS: Lower chest: No acute basilar airspace disease. 3 mm right lower lobe pulmonary nodule, series 4, image 5. This is unchanged dating back to 2021 exam and considered benign, needing no further imaging follow-up. Hepatobiliary: The liver is enlarged measuring 20 cm cranial caudal with diffuse steatosis. No evidence of focal liver lesion on this unenhanced exam. Clips in the gallbladder fossa postcholecystectomy. No biliary dilatation. Pancreas: Parenchymal atrophy. No ductal dilatation or inflammation. Spleen: Mildly enlarged spanning 14.3 cm AP. Adrenals/Urinary Tract: No adrenal nodule. Moderate left perinephric fat stranding and edema. Cystic changes at the left renal hilum corresponds to parapelvic cysts on prior contrast-enhanced CT. No cyst follow-up is recommended. There is no frank hydronephrosis. No renal or ureteral calculi. Minimal right perinephric edema. No hydronephrosis. Small right renal cortical and parapelvic cysts, needing no further imaging follow-up. Partially distended urinary bladder, no bladder wall thickening. No bladder stone.  Stomach/Bowel: The stomach is nondistended. Occasional fluid-filled small bowel but no  obstruction or inflammation. There is fluid/liquid stool throughout the colon. No bowel wall thickening. The appendix is not definitively seen on the current exam. There is no evidence of appendicitis. Vascular/Lymphatic: Minimal aortic atherosclerosis. No aortic aneurysm. Few prominent porta hepatis nodes which are likely reactive. No suspicious abdominopelvic adenopathy. Reproductive: Status post hysterectomy. No adnexal masses. Other: No free air or ascites. Diminutive fat containing umbilical hernia. Musculoskeletal: Diffuse lumbar degenerative change. Modic endplate changes at T12-L1, unchanged. There are no acute or suspicious osseous abnormalities. IMPRESSION: 1. No renal stones. 2. Moderate left perinephric fat stranding and edema, suspicious for pyelonephritis. Recommend correlation with urinalysis. Alternatively this may represent sequela of recently passed stone. 3. Fluid/liquid stool throughout the colon, can be seen with diarrheal illness. 4. Hepatosplenomegaly and hepatic steatosis. Aortic Atherosclerosis (ICD10-I70.0). Electronically Signed   By: Narda Rutherford M.D.   On: 07/19/2022 22:35   DG Chest Portable 1 View  Result Date: 07/19/2022 CLINICAL DATA:  Suspected sepsis. EXAM: PORTABLE CHEST 1 VIEW COMPARISON:  February 10, 2021 FINDINGS: The heart size and mediastinal contours are within normal limits. Both lungs are clear. The visualized skeletal structures are unremarkable. IMPRESSION: No active disease. Electronically Signed   By: Aram Candela M.D.   On: 07/19/2022 21:58           LOS: 1 day   Time spent= 35 mins    Purvi Ruehl Joline Maxcy, MD [facility]  If 7PM-7AM, please contact night-coverage  07/21/2022, 8:01 AM

## 2022-07-22 DIAGNOSIS — N179 Acute kidney failure, unspecified: Secondary | ICD-10-CM | POA: Diagnosis not present

## 2022-07-22 DIAGNOSIS — A419 Sepsis, unspecified organism: Secondary | ICD-10-CM | POA: Diagnosis not present

## 2022-07-22 DIAGNOSIS — N12 Tubulo-interstitial nephritis, not specified as acute or chronic: Secondary | ICD-10-CM | POA: Diagnosis not present

## 2022-07-22 LAB — CBC
HCT: 32.1 % — ABNORMAL LOW (ref 36.0–46.0)
Hemoglobin: 10.4 g/dL — ABNORMAL LOW (ref 12.0–15.0)
MCH: 29.2 pg (ref 26.0–34.0)
MCHC: 32.4 g/dL (ref 30.0–36.0)
MCV: 90.2 fL (ref 80.0–100.0)
Platelets: 129 10*3/uL — ABNORMAL LOW (ref 150–400)
RBC: 3.56 MIL/uL — ABNORMAL LOW (ref 3.87–5.11)
RDW: 15.7 % — ABNORMAL HIGH (ref 11.5–15.5)
WBC: 5.1 10*3/uL (ref 4.0–10.5)
nRBC: 0 % (ref 0.0–0.2)

## 2022-07-22 LAB — BASIC METABOLIC PANEL
Anion gap: 10 (ref 5–15)
BUN: 23 mg/dL (ref 8–23)
CO2: 21 mmol/L — ABNORMAL LOW (ref 22–32)
Calcium: 8.3 mg/dL — ABNORMAL LOW (ref 8.9–10.3)
Chloride: 103 mmol/L (ref 98–111)
Creatinine, Ser: 1.59 mg/dL — ABNORMAL HIGH (ref 0.44–1.00)
GFR, Estimated: 35 mL/min — ABNORMAL LOW (ref >60)
Glucose, Bld: 98 mg/dL (ref 70–99)
Potassium: 3.9 mmol/L (ref 3.5–5.1)
Sodium: 134 mmol/L — ABNORMAL LOW (ref 135–145)

## 2022-07-22 LAB — GLUCOSE, CAPILLARY
Glucose-Capillary: 99 mg/dL (ref 70–99)
Glucose-Capillary: 120 mg/dL — ABNORMAL HIGH (ref 70–99)
Glucose-Capillary: 86 mg/dL (ref 70–99)

## 2022-07-22 LAB — CULTURE, BLOOD (ROUTINE X 2): Special Requests: ADEQUATE

## 2022-07-22 LAB — URINE CULTURE

## 2022-07-22 LAB — MAGNESIUM: Magnesium: 1.9 mg/dL (ref 1.7–2.4)

## 2022-07-22 NOTE — Progress Notes (Signed)
Pt refused to wear CPAP tonight. ?

## 2022-07-22 NOTE — Progress Notes (Signed)
   07/22/22 2200  Hygiene  Oral Care Other (Comment) (Patient stated that she had already brushed her teeth today. Patient refused to brush teeth tonight. Educated patient about the need to brush teeth more than once a day.)

## 2022-07-22 NOTE — Progress Notes (Signed)
   07/22/22 1000  Mobility  Activity Ambulated with assistance in hallway  Level of Assistance Modified independent, requires aide device or extra time  Assistive Device None  Distance Ambulated (ft) 180 ft  Activity Response Tolerated well  Mobility Referral Yes  $Mobility charge 1 Mobility   Mobility Specialist Progress Note  During Mobility: 89% SpO2 Post-Mobility: 97% SpO2  Pt was in bed and agreeable. X2 standing breaks d/t feeling SOB. Returned to chair w/ all needs met and call bell in reach  Anastasia Pall Mobility Specialist  Please contact via Special educational needs teacher or Rehab office at [phone]

## 2022-07-22 NOTE — Progress Notes (Signed)
PROGRESS NOTE    [name] [name]  ZOX:[id number] DOB: Dec 16, 1955 DOA: 07/19/2022 PCP: Laurann Montana, MD   Brief Narrative:  67 year old with history of HTN, OSA, overactive bladder, depression, allergic rhinitis admitted to the hospital on 4/21 with septic shock secondary to pyelonephritis requiring pressors and ICU admission.  CT abdomen pelvis did not show any obstructing renal stone but there were concerns of possible left-sided pyelonephritis.   Assessment & Plan:  Principal Problem:   Septic shock Active Problems:   Acute cystitis without hematuria   Septic shock secondary to left-sided pyelonephritis Patient met criteria for septic shock on admission given persistent hypotension not resolved with fluids requiring pressor support plus severe lactic acidosis, leukocytosis, tachycardia and AKI- Now weaned off pressors conventional physiology improving. Rocephin.  Continue IV fluids.  Blood and urine growing out E. coli with sensitivities pending.  Acute kidney injury on CKD stage II; Baseline Cr 1.1 -Admission creatinine 2.4, and continues to improve with IV fluids, currently at 1.59  Obstructive sleep apnea -Bedtime CPAP  Essential hypertension -Current meds on hold.  On IV as needed  Peripheral neuropathy -We will slowly resume home medications  GERD -PPI  Morbid obesity Meets criteria with BMI greater than 40  DVT prophylaxis: Subcu.  Heparin Code Status: Full code Family Communication:  Daughter at bedside Status is: Inpatient -Awaiting normalization of renal function -Antibiotic sensitivities      Diet Orders (From admission, onward)     Start     Ordered   07/21/22 1111  Diet regular Room service appropriate? Yes; Fluid consistency: Thin  Diet effective now       Question Answer Comment  Room service appropriate? Yes   Fluid consistency: Thin      07/21/22 1110            Subjective: Patient feeling okay, minimal right flank  pain   Examination:  General exam: Alert and oriented x 3, no acute distress Respiratory system: Clear to auscultation bilaterally Cardiovascular system: Regular rate and rhythm, S1-S2 Gastrointestinal system: Soft, nontender, nondistended, positive bowel sounds Central nervous system: No focal deficits Extremities: No clubbing or cyanosis or edema Skin: No skin breaks, tears or lesions Psychiatry: Appropriate, no evidence of psychosis  Objective: Vitals:   07/21/22 1957 07/22/22 0352 07/22/22 0737 07/22/22 1609  BP: 111/75 124/64 116/62 (!) 143/65  Pulse: 97 60 72 69  Resp: Temp: 98.6 F (37 C) 98 F (36.7 C) (!) 97.4 F (36.3 C) 98.2 F (36.8 C)  TempSrc: Oral Oral Oral Oral  SpO2: 94% 97% 97% 99%  Weight:      Height:        Intake/Output Summary (Last 24 hours) at 07/22/2022 1713 Last data filed at 07/22/2022 0205 Gross per 24 hour  Intake 838 ml  Output --  Net 838 ml    Filed Weights   07/19/22 2111 07/20/22 0500 07/21/22 0500  Weight: 116.6 kg 116.4 kg 117.7 kg    Scheduled Meds:  Chlorhexidine Gluconate Cloth  6 each Topical Q0600   cholecalciferol  1,000 Units Oral Daily   DULoxetine  60 mg Oral BID   heparin  5,000 Units Subcutaneous Q8H   loratadine  10 mg Oral Daily   mirabegron ER  50 mg Oral Daily   pantoprazole  40 mg Oral BID   potassium chloride  40 mEq Oral BID   pramipexole  0.75 mg Oral QHS   pregabalin  150 mg Oral BID  Continuous Infusions:  sodium chloride Stopped (07/21/22 6578)   cefTRIAXone (ROCEPHIN)  IV 2 g (07/21/22 2206)    Nutritional status     Body mass index is 44.54 kg/m.  Data Reviewed:  Creatinine of 1.59, sodium of 134, hemoglobin stable at 10.4.  LOS: 2 days       Hollice Espy, MD [facility]  If 7PM-7AM, please contact night-coverage  07/22/2022, 5:13 PM

## 2022-07-22 NOTE — Progress Notes (Signed)
CPAP @ bedside. 

## 2022-07-23 DIAGNOSIS — I5032 Chronic diastolic (congestive) heart failure: Secondary | ICD-10-CM | POA: Diagnosis not present

## 2022-07-23 DIAGNOSIS — A419 Sepsis, unspecified organism: Secondary | ICD-10-CM | POA: Diagnosis not present

## 2022-07-23 DIAGNOSIS — N179 Acute kidney failure, unspecified: Secondary | ICD-10-CM | POA: Diagnosis not present

## 2022-07-23 DIAGNOSIS — N12 Tubulo-interstitial nephritis, not specified as acute or chronic: Secondary | ICD-10-CM | POA: Diagnosis not present

## 2022-07-23 LAB — BASIC METABOLIC PANEL
Anion gap: 11 (ref 5–15)
Anion gap: 9 (ref 5–15)
BUN: 22 mg/dL (ref 8–23)
BUN: 18 mg/dL (ref 8–23)
CO2: 20 mmol/L — ABNORMAL LOW (ref 22–32)
CO2: 20 mmol/L — ABNORMAL LOW (ref 22–32)
Calcium: 8.5 mg/dL — ABNORMAL LOW (ref 8.9–10.3)
Calcium: 8.5 mg/dL — ABNORMAL LOW (ref 8.9–10.3)
Chloride: 105 mmol/L (ref 98–111)
Chloride: 108 mmol/L (ref 98–111)
Creatinine, Ser: 1.32 mg/dL — ABNORMAL HIGH (ref 0.44–1.00)
Creatinine, Ser: 1.34 mg/dL — ABNORMAL HIGH (ref 0.44–1.00)
GFR, Estimated: 44 mL/min — ABNORMAL LOW (ref >60)
GFR, Estimated: 43 mL/min — ABNORMAL LOW (ref >60)
Glucose, Bld: 89 mg/dL (ref 70–99)
Glucose, Bld: 96 mg/dL (ref 70–99)
Potassium: 4 mmol/L (ref 3.5–5.1)
Potassium: 4.3 mmol/L (ref 3.5–5.1)
Sodium: 136 mmol/L (ref 135–145)
Sodium: 137 mmol/L (ref 135–145)

## 2022-07-23 LAB — GLUCOSE, CAPILLARY
Glucose-Capillary: 96 mg/dL (ref 70–99)
Glucose-Capillary: 125 mg/dL — ABNORMAL HIGH (ref 70–99)
Glucose-Capillary: 97 mg/dL (ref 70–99)

## 2022-07-23 MED ORDER — LIDOCAINE VISCOUS HCL 2 % MT SOLN
15.0000 mL | OROMUCOSAL | Status: DC | PRN
  Administered 2022-07-23: 15 mL via OROMUCOSAL
  Filled 2022-07-23 (×2): qty 15

## 2022-07-23 MED ORDER — CEFADROXIL 500 MG PO CAPS: 1000.0000 mg | ORAL_CAPSULE | Freq: Two times a day (BID) | ORAL | 0 refills | Status: AC

## 2022-07-23 MED ORDER — SODIUM CHLORIDE 0.9 % IV SOLN
250.0000 mL | INTRAVENOUS | Status: DC
  Administered 2022-07-23: 250 mL via INTRAVENOUS

## 2022-07-23 MED ORDER — CEFADROXIL 500 MG PO CAPS
1000.0000 mg | ORAL_CAPSULE | Freq: Two times a day (BID) | ORAL | Status: DC
  Administered 2022-07-23 – 2022-07-24 (×3): 1000 mg via ORAL
  Filled 2022-07-23 (×3): qty 2

## 2022-07-23 NOTE — Care Management Important Message (Signed)
Important Message  Patient Details  Name: [name] [name] MRN: [id number] Date of Birth: [birth year]   Medicare Important Message Given:  Yes     Sherilyn Banker 07/23/2022, 12:05 PM

## 2022-07-23 NOTE — Progress Notes (Signed)
No overnight issues. Pt ambulatory and independent. Pt states she feels better and is ready to go home.

## 2022-07-23 NOTE — TOC CM/SW Note (Signed)
  Transition of Care [facility]) Screening Note   Patient Details  Name: KRISINDA GIOVANNI Date of Birth: [birth year]   Transition of Care [facility]) CM/SW Contact:    Kingsley Plan, RN Phone Number: 07/23/2022, 2:06 PM    Transition of Care Department [facility]) has reviewed patient and no TOC needs have been identified at this time. We will continue to monitor patient advancement through interdisciplinary progression rounds. If new patient transition needs arise, please place a TOC consult.

## 2022-07-23 NOTE — Plan of Care (Signed)

## 2022-07-23 NOTE — Progress Notes (Signed)
PROGRESS NOTE    [name] [name]  WJX:[id number] DOB: Aug 22, 1955 DOA: 07/19/2022 PCP: Laurann Montana, MD   Brief Narrative:  67 year old with history of HTN, OSA, overactive bladder, depression, allergic rhinitis admitted to the hospital on 4/21 with septic shock secondary to pyelonephritis requiring pressors and ICU admission.  CT abdomen pelvis did not show any obstructing renal stone but there were concerns of possible left-sided pyelonephritis.   Assessment & Plan:  Principal Problem:   Septic shock Active Problems:   Acute cystitis without hematuria   Septic shock secondary to left-sided pyelonephritis Patient met criteria for septic shock on admission given persistent hypotension not resolved with fluids requiring pressor support plus severe lactic acidosis, leukocytosis, tachycardia and AKI- Now weaned off pressors conventional physiology improving. Rocephin.  Continue IV fluids.  Blood and urine growing out E. Coli.  Sensitivities returned, so changed over to p.o. Omnicef.  She will need to continue on this for 10 more days.  Acute kidney injury on CKD stage II; Baseline Cr 1.1 -Admission creatinine 2.4, and continues to improve with IV fluids, currently at 1.32.    Obstructive sleep apnea -Bedtime CPAP  Essential hypertension -Current meds on hold.  On IV as needed  Peripheral neuropathy -We will slowly resume home medications  GERD -PPI  Morbid obesity Meets criteria with BMI greater than 40  DVT prophylaxis: Subcu.  Heparin Code Status: Full code Family Communication:  Daughter at bedside Status is: Inpatient -Awaiting normalization of renal function      Diet Orders (From admission, onward)     Start     Ordered   07/21/22 1111  Diet regular Room service appropriate? Yes; Fluid consistency: Thin  Diet effective now       Question Answer Comment  Room service appropriate? Yes   Fluid consistency: Thin      07/21/22 1110             Subjective: Less pain today.,  Eager to go home.   Examination:  General exam: Alert and oriented x 3, no acute distress Respiratory system: Clear to auscultation bilaterally Cardiovascular system: Regular rate and rhythm, S1-S2 Gastrointestinal system: Soft, nontender, nondistended, positive bowel sounds Central nervous system: No focal deficits Extremities: No clubbing or cyanosis or edema Skin: No skin breaks, tears or lesions Psychiatry: Appropriate, no evidence of psychosis  Objective: Vitals:   07/23/22 0323 07/23/22 0331 07/23/22 0808 07/23/22 1643  BP: 127/66  138/66 (!) 147/63  Pulse: 66  75 65  Resp: Temp: 97.7 F (36.5 C)   97.6 F (36.4 C)  TempSrc: Oral   Oral  SpO2: 98%  100% 97%  Weight:  120 kg    Height:        Intake/Output Summary (Last 24 hours) at 07/23/2022 1702 Last data filed at 07/23/2022 1642 Gross per 24 hour  Intake 720 ml  Output --  Net 720 ml    Filed Weights   07/20/22 0500 07/21/22 0500 07/23/22 0331  Weight: 116.4 kg 117.7 kg 120 kg    Scheduled Meds:  cefadroxil  1,000 mg Oral BID   Chlorhexidine Gluconate Cloth  6 each Topical Q0600   cholecalciferol  1,000 Units Oral Daily   DULoxetine  60 mg Oral BID   heparin  5,000 Units Subcutaneous Q8H   loratadine  10 mg Oral Daily   mirabegron ER  50 mg Oral Daily   pantoprazole  40 mg Oral BID   potassium chloride  40  mEq Oral BID   pramipexole  0.75 mg Oral QHS   pregabalin  150 mg Oral BID   Continuous Infusions:  sodium chloride 250 mL (07/23/22 1022)    Nutritional status     Body mass index is 45.41 kg/m.  Data Reviewed:  Creatinine of 1.34  LOS: 3 days       Hollice Espy, MD [facility]  If 7PM-7AM, please contact night-coverage  07/23/2022, 5:02 PM

## 2022-07-23 NOTE — Progress Notes (Signed)
   07/23/22 1000  Mobility  Activity Ambulated with assistance in hallway  Level of Assistance Modified independent, requires aide device or extra time  Assistive Device None  Distance Ambulated (ft) 180 ft  Activity Response Tolerated well  Mobility Referral Yes  $Mobility charge 1 Mobility   Mobility Specialist Progress Note  Post-Mobility: 114 HR, 93% SpO2  Pt was in bed and agreeable. X1 standing break d/t fatigue. Returned to chair w/ all needs met and call bell in reach.   [name] [name] Mobility Specialist  Please contact via SecureChat or Rehab office at [phone]

## 2022-07-24 DIAGNOSIS — N3000 Acute cystitis without hematuria: Secondary | ICD-10-CM | POA: Diagnosis not present

## 2022-07-24 DIAGNOSIS — B379 Candidiasis, unspecified: Secondary | ICD-10-CM | POA: Diagnosis not present

## 2022-07-24 DIAGNOSIS — A419 Sepsis, unspecified organism: Secondary | ICD-10-CM | POA: Diagnosis not present

## 2022-07-24 DIAGNOSIS — N179 Acute kidney failure, unspecified: Secondary | ICD-10-CM | POA: Diagnosis not present

## 2022-07-24 LAB — BASIC METABOLIC PANEL
Anion gap: 6 (ref 5–15)
BUN: 15 mg/dL (ref 8–23)
CO2: 21 mmol/L — ABNORMAL LOW (ref 22–32)
Calcium: 8.6 mg/dL — ABNORMAL LOW (ref 8.9–10.3)
Chloride: 111 mmol/L (ref 98–111)
Creatinine, Ser: 1.27 mg/dL — ABNORMAL HIGH (ref 0.44–1.00)
GFR, Estimated: 46 mL/min — ABNORMAL LOW (ref >60)
Glucose, Bld: 89 mg/dL (ref 70–99)
Potassium: 4.4 mmol/L (ref 3.5–5.1)
Sodium: 138 mmol/L (ref 135–145)

## 2022-07-24 LAB — BRAIN NATRIURETIC PEPTIDE: B Natriuretic Peptide: 117.5 pg/mL — ABNORMAL HIGH (ref 0.0–100.0)

## 2022-07-24 MED ORDER — FLUCONAZOLE 150 MG PO TABS: ORAL_TABLET | ORAL | 0 refills | Status: AC

## 2022-07-24 MED ORDER — MAGIC MOUTHWASH
10.0000 mL | Freq: Three times a day (TID) | ORAL | Status: DC
  Administered 2022-07-24: 10 mL via ORAL
  Filled 2022-07-24 (×2): qty 10

## 2022-07-24 NOTE — Plan of Care (Signed)
  Problem: Pain Managment: Goal: General experience of comfort will improve Outcome: Progressing   Problem: Safety: Goal: Ability to remain free from injury will improve Outcome: Progressing   Problem: Skin Integrity: Goal: Risk for impaired skin integrity will decrease Outcome: Progressing   

## 2022-07-24 NOTE — Discharge Summary (Signed)
Physician Discharge Summary   Patient: [name] [name] MRN: [id number] DOB: Nov 10, 1955  Admit date:     07/19/2022  Discharge date: 07/24/22  Discharge Physician: Hollice Espy   PCP: Laurann Montana, MD   Recommendations at discharge:   New medication: Omnicef 1000 mg p.o. twice daily x 9 days. New medication: Diflucan 150 mg p.o. x 1 dose on 4/26 and second dose after antibiotics have been completed Medication change: Macrobid discontinued  Discharge Diagnoses: Principal Problem:   Septic shock (HCC) Active Problems:   Acute cystitis without hematuria  Resolved Problems:   * No resolved hospital problems. [facility] Course: 67 year old with history of HTN, OSA, overactive bladder, depression, allergic rhinitis admitted to the hospital on 4/21 with septic shock secondary to pyelonephritis requiring pressors and ICU admission. CT abdomen pelvis did not show any obstructing renal stone but there were concerns of possible left-sided pyelonephritis.   Assessment and Plan: Septic shock secondary to left-sided pyelonephritis Patient met criteria for septic shock on admission given persistent hypotension not resolved with fluids requiring pressor support plus severe lactic acidosis, leukocytosis, tachycardia and AKI- Now weaned off pressors conventional physiology improving. Rocephin.  Continue IV fluids.  Blood and urine growing out E. Coli.  Sensitivities returned, so changed over to p.o. Omnicef.  She will need to continue on this for 9 more days.  Yeast infection: Patient started developing yeast infection while on antibiotics.  Given 1 dose of Diflucan 150 upon discharge.  She is also prescribed a second dose to be taken after antibiotics are completed.   Acute kidney injury on CKD stage II; Baseline Cr 1.1 -Admission creatinine 2.4, and continues to improve with IV fluids, and by day of discharge, down to 1.27   Obstructive sleep apnea -Bedtime CPAP   Essential  hypertension -Current meds on hold.  On IV as needed   Peripheral neuropathy -We will slowly resume home medications   GERD -PPI   Morbid obesity Meets criteria with BMI greater than 40        Consultants: Critical care Procedures performed: None Disposition: Home Diet recommendation:  Discharge Diet Orders (From admission, onward)     Start     Ordered   07/24/22 0000  Diet - low sodium heart healthy        07/24/22 1156           Heart healthy DISCHARGE MEDICATION: Allergies as of 07/24/2022       Reactions   Codeine Nausea Only   Diclofenac Sodium Other (See Comments)   Worsens RLS   Flonase [fluticasone] Itching        Medication List     STOP taking these medications    nitrofurantoin (macrocrystal-monohydrate) 100 MG capsule Commonly known as: MACROBID       TAKE these medications    acetaminophen 325 MG tablet Commonly known as: TYLENOL Take 650 mg by mouth every 6 (six) hours as needed for mild pain.   albuterol 108 (90 Base) MCG/ACT inhaler Commonly known as: VENTOLIN HFA Inhale 2 puffs into the lungs every 6 (six) hours as needed for shortness of breath.   cefadroxil 500 MG capsule Commonly known as: DURICEF Take 2 capsules (1,000 mg total) by mouth 2 (two) times daily for 9 days.   cetirizine 10 MG tablet Commonly known as: ZYRTEC Take 10 mg by mouth as needed for allergies.   cholecalciferol 25 MCG (1000 UNIT) tablet Commonly known as: VITAMIN D3 Take 1,000 Units by mouth daily.  DULoxetine 60 MG capsule Commonly known as: CYMBALTA Take 60 mg by mouth 2 (two) times daily.   fluconazole 150 MG tablet Commonly known as: Diflucan Take one tablet now and one after completion of antibiotics   losartan-hydrochlorothiazide 50-12.5 MG tablet Commonly known as: HYZAAR Take 1 tablet by mouth daily.   MULTIVITAL PO Take 1 tablet by mouth daily.   Myrbetriq 50 MG Tb24 tablet Generic drug: mirabegron ER Take 50 mg by mouth  daily.   pantoprazole 40 MG tablet Commonly known as: PROTONIX Take 40 mg by mouth at bedtime.   potassium chloride SA 20 MEQ tablet Commonly known as: KLOR-CON M Take 1 tablet (20 mEq total) by mouth daily. Take one tablet twice a day for five days, then one tablet once a day What changed:  how much to take additional instructions   pramipexole 0.75 MG tablet Commonly known as: MIRAPEX Take 0.75 mg by mouth at bedtime.   pregabalin 75 MG capsule Commonly known as: LYRICA Take 75 mg by mouth at bedtime as needed (Pain). **In addition to 150 mg as needed   pregabalin 150 MG capsule Commonly known as: LYRICA Take 150 mg by mouth 2 (two) times daily.   Taltz 80 MG/ML Soaj Generic drug: Ixekizumab Inject 80 mg as directed every 30 (thirty) days.        Discharge Exam: Filed Weights   07/20/22 0500 07/21/22 0500 07/23/22 0331  Weight: 116.4 kg 117.7 kg 120 kg   General: Alert and oriented x 3, no acute distress Cardiovascular: Regular rate and rhythm, S1-S2  Condition at discharge: good  The results of significant diagnostics from this hospitalization (including imaging, microbiology, ancillary and laboratory) are listed below for reference.   Imaging Studies: CT Renal Stone Study  Result Date: 07/19/2022 CLINICAL DATA:  Abdominal/flank pain, stone suspected EXAM: CT ABDOMEN AND PELVIS WITHOUT CONTRAST TECHNIQUE: Multidetector CT imaging of the abdomen and pelvis was performed following the standard protocol without IV contrast. RADIATION DOSE REDUCTION: This exam was performed according to the departmental dose-optimization program which includes automated exposure control, adjustment of the mA and/or kV according to patient size and/or use of iterative reconstruction technique. COMPARISON:  CT 02/18/2021 FINDINGS: Lower chest: No acute basilar airspace disease. 3 mm right lower lobe pulmonary nodule, series 4, image 5. This is unchanged dating back to 2021 exam and  considered benign, needing no further imaging follow-up. Hepatobiliary: The liver is enlarged measuring 20 cm cranial caudal with diffuse steatosis. No evidence of focal liver lesion on this unenhanced exam. Clips in the gallbladder fossa postcholecystectomy. No biliary dilatation. Pancreas: Parenchymal atrophy. No ductal dilatation or inflammation. Spleen: Mildly enlarged spanning 14.3 cm AP. Adrenals/Urinary Tract: No adrenal nodule. Moderate left perinephric fat stranding and edema. Cystic changes at the left renal hilum corresponds to parapelvic cysts on prior contrast-enhanced CT. No cyst follow-up is recommended. There is no frank hydronephrosis. No renal or ureteral calculi. Minimal right perinephric edema. No hydronephrosis. Small right renal cortical and parapelvic cysts, needing no further imaging follow-up. Partially distended urinary bladder, no bladder wall thickening. No bladder stone. Stomach/Bowel: The stomach is nondistended. Occasional fluid-filled small bowel but no obstruction or inflammation. There is fluid/liquid stool throughout the colon. No bowel wall thickening. The appendix is not definitively seen on the current exam. There is no evidence of appendicitis. Vascular/Lymphatic: Minimal aortic atherosclerosis. No aortic aneurysm. Few prominent porta hepatis nodes which are likely reactive. No suspicious abdominopelvic adenopathy. Reproductive: Status post hysterectomy. No adnexal masses. Other: No  free air or ascites. Diminutive fat containing umbilical hernia. Musculoskeletal: Diffuse lumbar degenerative change. Modic endplate changes at T12-L1, unchanged. There are no acute or suspicious osseous abnormalities. IMPRESSION: 1. No renal stones. 2. Moderate left perinephric fat stranding and edema, suspicious for pyelonephritis. Recommend correlation with urinalysis. Alternatively this may represent sequela of recently passed stone. 3. Fluid/liquid stool throughout the colon, can be seen with  diarrheal illness. 4. Hepatosplenomegaly and hepatic steatosis. Aortic Atherosclerosis (ICD10-I70.0). Electronically Signed   By: Narda Rutherford M.D.   On: 07/19/2022 22:35   DG Chest Portable 1 View  Result Date: 07/19/2022 CLINICAL DATA:  Suspected sepsis. EXAM: PORTABLE CHEST 1 VIEW COMPARISON:  February 10, 2021 FINDINGS: The heart size and mediastinal contours are within normal limits. Both lungs are clear. The visualized skeletal structures are unremarkable. IMPRESSION: No active disease. Electronically Signed   By: Aram Candela M.D.   On: 07/19/2022 21:58    Microbiology: Results for orders placed or performed during the hospital encounter of 07/19/22  Culture, blood (Routine x 2)     Status: Abnormal   Collection Time: 07/19/22  9:16 PM   Specimen: BLOOD  Result Value Ref Range Status   Specimen Description BLOOD BLOOD RIGHT ARM  Final   Special Requests   Final    BOTTLES DRAWN AEROBIC AND ANAEROBIC Blood Culture results may not be optimal due to an excessive volume of blood received in culture bottles   Culture  Setup Time   Final    GRAM NEGATIVE RODS IN BOTH AEROBIC AND ANAEROBIC BOTTLES CRITICAL RESULT CALLED TO, READ BACK BY AND VERIFIED WITH:  C/ PHARMD A. PAYTES 07/20/22 1102 A. LAFRANCE Performed at [facility], [address] [place], [place] [address]    Culture ESCHERICHIA COLI (A)  Final   Report Status 07/22/2022 FINAL  Final   Organism ID, Bacteria ESCHERICHIA COLI  Final      Susceptibility   Escherichia coli - MIC*    AMPICILLIN >=32 RESISTANT Resistant     CEFEPIME <=0.12 SENSITIVE Sensitive     CEFTAZIDIME <=1 SENSITIVE Sensitive     CEFTRIAXONE <=0.25 SENSITIVE Sensitive     CIPROFLOXACIN <=0.25 SENSITIVE Sensitive     GENTAMICIN <=1 SENSITIVE Sensitive     IMIPENEM <=0.25 SENSITIVE Sensitive     TRIMETH/SULFA >=320 RESISTANT Resistant     AMPICILLIN/SULBACTAM 4 SENSITIVE Sensitive     PIP/TAZO <=4 SENSITIVE Sensitive     * ESCHERICHIA  COLI  Culture, blood (Routine x 2)     Status: Abnormal   Collection Time: 07/19/22  9:16 PM   Specimen: BLOOD  Result Value Ref Range Status   Specimen Description BLOOD BLOOD LEFT HAND  Final   Special Requests   Final    BOTTLES DRAWN AEROBIC ONLY Blood Culture adequate volume   Culture  Setup Time   Final    AEROBIC BOTTLE ONLY GRAM NEGATIVE RODS CRITICAL VALUE NOTED.  VALUE IS CONSISTENT WITH PREVIOUSLY REPORTED AND CALLED VALUE.    Culture (A)  Final    ESCHERICHIA COLI SUSCEPTIBILITIES PERFORMED ON PREVIOUS CULTURE WITHIN THE LAST 5 DAYS. Performed at [facility], [address] [place], [place] [address]    Report Status 07/22/2022 FINAL  Final  Blood Culture ID Panel (Reflexed)     Status: Abnormal   Collection Time: 07/19/22  9:16 PM  Result Value Ref Range Status   Enterococcus faecalis NOT DETECTED NOT DETECTED Final   Enterococcus Faecium NOT DETECTED NOT DETECTED  Final   Listeria monocytogenes NOT DETECTED NOT DETECTED Final   Staphylococcus species NOT DETECTED NOT DETECTED Final   Staphylococcus aureus (BCID) NOT DETECTED NOT DETECTED Final   Staphylococcus epidermidis NOT DETECTED NOT DETECTED Final   Staphylococcus lugdunensis NOT DETECTED NOT DETECTED Final   Streptococcus species NOT DETECTED NOT DETECTED Final   Streptococcus agalactiae NOT DETECTED NOT DETECTED Final   Streptococcus pneumoniae NOT DETECTED NOT DETECTED Final   Streptococcus pyogenes NOT DETECTED NOT DETECTED Final   A.calcoaceticus-baumannii NOT DETECTED NOT DETECTED Final   Bacteroides fragilis NOT DETECTED NOT DETECTED Final   Enterobacterales DETECTED (A) NOT DETECTED Final    Comment: Enterobacterales represent a large order of gram negative bacteria, not a single organism. CRITICAL RESULT CALLED TO, READ BACK BY AND VERIFIED WITH:  C/ PHARMD A. PAYTES 07/20/22 1102 A. LAFRANCE    Enterobacter cloacae complex NOT DETECTED NOT DETECTED Final   Escherichia coli DETECTED (A) NOT  DETECTED Final    Comment: CRITICAL RESULT CALLED TO, READ BACK BY AND VERIFIED WITH:  C/ PHARMD A. PAYTES 07/20/22 1102 A. LAFRANCE    Klebsiella aerogenes NOT DETECTED NOT DETECTED Final   Klebsiella oxytoca NOT DETECTED NOT DETECTED Final   Klebsiella pneumoniae NOT DETECTED NOT DETECTED Final   Proteus species NOT DETECTED NOT DETECTED Final   Salmonella species NOT DETECTED NOT DETECTED Final   Serratia marcescens NOT DETECTED NOT DETECTED Final   Haemophilus influenzae NOT DETECTED NOT DETECTED Final   Neisseria meningitidis NOT DETECTED NOT DETECTED Final   Pseudomonas aeruginosa NOT DETECTED NOT DETECTED Final   Stenotrophomonas maltophilia NOT DETECTED NOT DETECTED Final   Candida albicans NOT DETECTED NOT DETECTED Final   Candida auris NOT DETECTED NOT DETECTED Final   Candida glabrata NOT DETECTED NOT DETECTED Final   Candida krusei NOT DETECTED NOT DETECTED Final   Candida parapsilosis NOT DETECTED NOT DETECTED Final   Candida tropicalis NOT DETECTED NOT DETECTED Final   Cryptococcus neoformans/gattii NOT DETECTED NOT DETECTED Final   CTX-M ESBL NOT DETECTED NOT DETECTED Final   Carbapenem resistance IMP NOT DETECTED NOT DETECTED Final   Carbapenem resistance KPC NOT DETECTED NOT DETECTED Final   Carbapenem resistance NDM NOT DETECTED NOT DETECTED Final   Carbapenem resist OXA 48 LIKE NOT DETECTED NOT DETECTED Final   Carbapenem resistance VIM NOT DETECTED NOT DETECTED Final    Comment: Performed at [facility], [address], [place], [place] [address]  Urine Culture (for pregnant, neutropenic or urologic patients or patients with an indwelling urinary catheter)     Status: Abnormal   Collection Time: 07/19/22 11:44 PM   Specimen: Urine, Clean Catch  Result Value Ref Range Status   Specimen Description URINE, CLEAN CATCH  Final   Special Requests   Final    NONE Performed at [facility], [address] [place], [place] [address]    Culture 20,000  COLONIES/mL ESCHERICHIA COLI (A)  Final   Report Status 07/22/2022 FINAL  Final   Organism ID, Bacteria ESCHERICHIA COLI (A)  Final      Susceptibility   Escherichia coli - MIC*    AMPICILLIN >=32 RESISTANT Resistant     CEFAZOLIN <=4 SENSITIVE Sensitive     CEFEPIME <=0.12 SENSITIVE Sensitive     CEFTRIAXONE <=0.25 SENSITIVE Sensitive     CIPROFLOXACIN <=0.25 SENSITIVE Sensitive     GENTAMICIN <=1 SENSITIVE Sensitive     IMIPENEM <=0.25 SENSITIVE Sensitive     NITROFURANTOIN 32 SENSITIVE Sensitive  TRIMETH/SULFA >=320 RESISTANT Resistant     AMPICILLIN/SULBACTAM 4 SENSITIVE Sensitive     PIP/TAZO <=4 SENSITIVE Sensitive     * 20,000 COLONIES/mL ESCHERICHIA COLI  C Difficile Quick Screen w PCR reflex     Status: None   Collection Time: 07/20/22  2:25 AM   Specimen: STOOL  Result Value Ref Range Status   C Diff antigen NEGATIVE NEGATIVE Final   C Diff toxin NEGATIVE NEGATIVE Final   C Diff interpretation No C. difficile detected.  Final    Comment: Performed at [facility], [address], [place], [place] [address]  Gastrointestinal Panel by PCR , Stool     Status: None   Collection Time: 07/20/22  2:25 AM   Specimen: STOOL  Result Value Ref Range Status   Campylobacter species NOT DETECTED NOT DETECTED Final   Plesimonas shigelloides NOT DETECTED NOT DETECTED Final   Salmonella species NOT DETECTED NOT DETECTED Final   Yersinia enterocolitica NOT DETECTED NOT DETECTED Final   Vibrio species NOT DETECTED NOT DETECTED Final   Vibrio cholerae NOT DETECTED NOT DETECTED Final   Enteroaggregative E coli (EAEC) NOT DETECTED NOT DETECTED Final   Enteropathogenic E coli (EPEC) NOT DETECTED NOT DETECTED Final   Enterotoxigenic E coli (ETEC) NOT DETECTED NOT DETECTED Final   Shiga like toxin producing E coli (STEC) NOT DETECTED NOT DETECTED Final   Shigella/Enteroinvasive E coli (EIEC) NOT DETECTED NOT DETECTED Final   Cryptosporidium NOT DETECTED NOT DETECTED Final    Cyclospora cayetanensis NOT DETECTED NOT DETECTED Final   Entamoeba histolytica NOT DETECTED NOT DETECTED Final   Giardia lamblia NOT DETECTED NOT DETECTED Final   Adenovirus F40/41 NOT DETECTED NOT DETECTED Final   Astrovirus NOT DETECTED NOT DETECTED Final   Norovirus GI/GII NOT DETECTED NOT DETECTED Final   Rotavirus A NOT DETECTED NOT DETECTED Final   Sapovirus (I, II, IV, and V) NOT DETECTED NOT DETECTED Final    Comment: Performed at [facility], [address] [place], [place] [address]  MRSA Next Gen by PCR, Nasal     Status: None   Collection Time: 07/20/22  5:58 AM   Specimen: Nasal Mucosa; Nasal Swab  Result Value Ref Range Status   MRSA by PCR Next Gen NOT DETECTED NOT DETECTED Final    Comment: (NOTE) The GeneXpert MRSA Assay (FDA approved for NASAL specimens only), is one component of a comprehensive MRSA colonization surveillance program. It is not intended to diagnose MRSA infection nor to guide or monitor treatment for MRSA infections. Test performance is not FDA approved in patients less than 42 years old. Performed at [facility], [address], [place], [place] [address]     Labs: CBC: Recent Labs  Lab 07/19/22 2116 07/20/22 0138 07/20/22 0723 07/21/22 0207 07/22/22 0116  WBC 15.1* 13.2* 13.7* 6.3 5.1  NEUTROABS 12.9*  --   --  4.7  --   HGB 14.3 12.5 12.2 10.4* 10.4*  HCT 42.4 38.1 37.1 32.2* 32.1*  MCV 88.1 89.2 88.5 89.7 90.2  PLT 190 157 167 119* 129*   Basic Metabolic Panel: Recent Labs  Lab 07/20/22 0138 07/20/22 0723 07/21/22 0207 07/22/22 0116 07/23/22 0112 07/23/22 1733 07/24/22 0815  NA  --  129* 130* 134* 136 137 138  K  --  3.2* 3.9 3.9 4.0 4.3 4.4  CL  --  98 101 103 105 108 111  CO2  --  20* 21* 21* 20* 20* 21*  GLUCOSE  --  113* 112* 98 89 96 89  BUN  --  35* 31* 23 22 18 15   CREATININE 2.65* 2.40* 1.93* 1.59* 1.32* 1.34* 1.27*  CALCIUM  --  8.1* 8.2* 8.3* 8.5* 8.5* 8.6*  MG 1.9 2.3 2.2 1.9  --   --   --     Liver Function Tests: Recent Labs  Lab 07/19/22 2116 07/21/22 0207  AST 60* 43*  ALT 38 34  ALKPHOS 88 65  BILITOT 1.4* 0.6  PROT 6.7 5.3*  ALBUMIN 2.8* 2.0*   CBG: Recent Labs  Lab 07/22/22 2018 07/22/22 2316 07/23/22 0319 07/23/22 0807 07/23/22 1134  GLUCAP 120* 86 96 125* 97    Discharge time spent: less than 30 minutes.  Signed: Hollice Espy, MD [facility] 07/24/2022

## 2022-07-24 NOTE — Discharge Instructions (Signed)
Restart Hyzaar on Sunday

## 2022-07-24 NOTE — Progress Notes (Signed)
   07/24/22 1000  Mobility  Activity Ambulated with assistance in hallway  Level of Assistance Modified independent, requires aide device or extra time  Assistive Device None  Distance Ambulated (ft) 250 ft  Activity Response Tolerated well  Mobility Referral Yes  $Mobility charge 1 Mobility   Mobility Specialist Progress Note  Pt was in chair and agreeable. Had no c/o pain. Returned to chair w/ all needs met and call bell in reach  Anastasia Pall Mobility Specialist  Please contact via Special educational needs teacher or Rehab office at [phone]

## 2022-07-24 NOTE — Progress Notes (Signed)
AVS given and reviewed with pt. Medications discussed. All questions answered to satisfaction. Pt verbalized understanding of information given. Pt escorted off the unit with all belongings via wheelchair by volunteer services.  

## 2022-07-27 DIAGNOSIS — Z86010 Personal history of colonic polyps: Secondary | ICD-10-CM | POA: Diagnosis not present

## 2022-07-27 DIAGNOSIS — R1319 Other dysphagia: Secondary | ICD-10-CM | POA: Diagnosis not present

## 2022-07-29 DIAGNOSIS — K3189 Other diseases of stomach and duodenum: Secondary | ICD-10-CM | POA: Diagnosis not present

## 2022-07-29 DIAGNOSIS — K2289 Other specified disease of esophagus: Secondary | ICD-10-CM | POA: Diagnosis not present

## 2022-07-29 DIAGNOSIS — K317 Polyp of stomach and duodenum: Secondary | ICD-10-CM | POA: Diagnosis not present

## 2022-07-29 DIAGNOSIS — K319 Disease of stomach and duodenum, unspecified: Secondary | ICD-10-CM | POA: Diagnosis not present

## 2022-07-29 DIAGNOSIS — R1310 Dysphagia, unspecified: Secondary | ICD-10-CM | POA: Diagnosis not present

## 2022-07-31 DIAGNOSIS — K2289 Other specified disease of esophagus: Secondary | ICD-10-CM | POA: Diagnosis not present

## 2022-07-31 DIAGNOSIS — K317 Polyp of stomach and duodenum: Secondary | ICD-10-CM | POA: Diagnosis not present

## 2022-07-31 DIAGNOSIS — K319 Disease of stomach and duodenum, unspecified: Secondary | ICD-10-CM | POA: Diagnosis not present

## 2022-08-03 DIAGNOSIS — N179 Acute kidney failure, unspecified: Secondary | ICD-10-CM | POA: Diagnosis not present

## 2022-08-03 DIAGNOSIS — Z6841 Body Mass Index (BMI) 40.0 and over, adult: Secondary | ICD-10-CM | POA: Diagnosis not present

## 2022-08-03 DIAGNOSIS — Z87448 Personal history of other diseases of urinary system: Secondary | ICD-10-CM | POA: Diagnosis not present

## 2022-08-03 DIAGNOSIS — D649 Anemia, unspecified: Secondary | ICD-10-CM | POA: Diagnosis not present

## 2022-08-03 DIAGNOSIS — I10 Essential (primary) hypertension: Secondary | ICD-10-CM | POA: Diagnosis not present

## 2022-08-21 DIAGNOSIS — L4059 Other psoriatic arthropathy: Secondary | ICD-10-CM | POA: Diagnosis not present

## 2022-08-21 DIAGNOSIS — Z79899 Other long term (current) drug therapy: Secondary | ICD-10-CM | POA: Diagnosis not present

## 2022-09-02 DIAGNOSIS — R300 Dysuria: Secondary | ICD-10-CM | POA: Diagnosis not present

## 2022-09-02 DIAGNOSIS — N3001 Acute cystitis with hematuria: Secondary | ICD-10-CM | POA: Diagnosis not present

## 2022-09-30 DIAGNOSIS — D225 Melanocytic nevi of trunk: Secondary | ICD-10-CM | POA: Diagnosis not present

## 2022-09-30 DIAGNOSIS — L239 Allergic contact dermatitis, unspecified cause: Secondary | ICD-10-CM | POA: Diagnosis not present

## 2022-09-30 DIAGNOSIS — M2042 Other hammer toe(s) (acquired), left foot: Secondary | ICD-10-CM | POA: Diagnosis not present

## 2022-09-30 DIAGNOSIS — L814 Other melanin hyperpigmentation: Secondary | ICD-10-CM | POA: Diagnosis not present

## 2022-09-30 DIAGNOSIS — L821 Other seborrheic keratosis: Secondary | ICD-10-CM | POA: Diagnosis not present

## 2022-09-30 DIAGNOSIS — L03032 Cellulitis of left toe: Secondary | ICD-10-CM | POA: Diagnosis not present

## 2022-10-12 DIAGNOSIS — J340 Abscess, furuncle and carbuncle of nose: Secondary | ICD-10-CM | POA: Diagnosis not present

## 2022-10-21 DIAGNOSIS — Z1231 Encounter for screening mammogram for malignant neoplasm of breast: Secondary | ICD-10-CM | POA: Diagnosis not present

## 2022-11-04 ENCOUNTER — Ambulatory Visit: Payer: Medicare PPO | Admitting: Podiatry

## 2022-11-04 DIAGNOSIS — L03032 Cellulitis of left toe: Secondary | ICD-10-CM | POA: Diagnosis not present

## 2022-11-04 DIAGNOSIS — L97522 Non-pressure chronic ulcer of other part of left foot with fat layer exposed: Secondary | ICD-10-CM

## 2022-11-04 DIAGNOSIS — M2041 Other hammer toe(s) (acquired), right foot: Secondary | ICD-10-CM

## 2022-11-04 DIAGNOSIS — M2042 Other hammer toe(s) (acquired), left foot: Secondary | ICD-10-CM | POA: Diagnosis not present

## 2022-11-04 MED ORDER — DOXYCYCLINE HYCLATE 100 MG PO TABS: 100.0000 mg | ORAL_TABLET | Freq: Two times a day (BID) | ORAL | 0 refills | Status: AC

## 2022-11-04 NOTE — Progress Notes (Signed)
No chief complaint on file.   SUBJECTIVE Patient presenting today for evaluation of symptomatic calluses to the distal tips of the second and third toe bilateral.  Patient last seen in our office 11/26/2021.  Almost 1 year.  She says that she has not had her calluses evaluated or treated since she was seen last year in our office.  They are very tender and painful and she has noticed increased redness and swelling to the left second toe   Past Medical History:  Diagnosis Date   Allergic rhinitis    Arthritis    Depression    Factor V Leiden carrier (HCC) 08/26/2011   Fatty liver    Fibroids    dysfuction uterine bleeding   GERD (gastroesophageal reflux disease)    Hypertension    Osteoarthritis    Peripheral neuropathy    Psoriatic arthritis (HCC)    Restless leg syndrome    Past Surgical History:  Procedure Laterality Date   CHOLECYSTECTOMY     KNEE SURGERY     right TKR   LUMBAR SPINE SURGERY     TUBAL LIGATION     bilateral   URETHRAL SLING     VAGINAL HYSTERECTOMY     Allergies  Allergen Reactions   Codeine Nausea Only   Diclofenac Sodium Other (See Comments)    Worsens RLS   Flonase [Fluticasone] Itching    OBJECTIVE General Patient is awake, alert, and oriented x 3 and in no acute distress. Derm ulcer noted to the  RT 3rd and LT 2nd toes each similar in presentation measuring approximately 0.3 x 0.3 x 0.2 cm.  There is no exposed bone.  Granular tissue noted along the wound base.  Periwound is callused.  There is some associated erythema and edema localized to the distal half of the left second toe  Vasc  DP and PT pedal pulses palpable bilaterally. Temperature gradient within normal limits.  Clinically no concern for vascular compromise Neuro light touch and protective threshold sensation diminished bilaterally.  Musculoskeletal Exam semireducible hammertoe deformity noted to the lesser digits bilateral contributing to the pressure and calluses that developed  to the distal tips of the toes  ASSESSMENT 1.  Hammertoe deformity lesser digits bilateral 2.  Ulcer RT 3rd toe and LT 2nd toe 3.  Cellulitis left second toe 4.  Preulcerative callus lesions distal tips of the toes bilateral 5.  Patient is not diabetic  PLAN OF CARE -Patient evaluated -Medically necessary excisional debridement including subcutaneous tissue was performed to the left second toe and right third toe ulcers at the distal tips of the toes using a 312 scalpel.  Excisional debridement of the necrotic tissue down to healthier bleeding viable tissue was performed with postdebridement measurement same as pre- -Prescription for doxycycline 100 mg 2 times daily #20 to address the mild localized erythema to the left second toe -Offloading silicone toe crest pads were dispensed to wear daily to help elevate the toes and offload pressure from the distal tips of the toes and counteract the hammertoe deformities -Advise against going barefoot -Return to clinic 3 weeks, x-rays bilateral feet next visit    Felecia Shelling, DPM [facility]  Dr. Felecia Shelling, DPM    [address]                                      [place],  [place] [address]                Office [phone]  Fax [phone]

## 2022-11-25 ENCOUNTER — Ambulatory Visit: Payer: Medicare PPO | Admitting: Podiatry

## 2022-11-25 ENCOUNTER — Ambulatory Visit (INDEPENDENT_AMBULATORY_CARE_PROVIDER_SITE_OTHER): Payer: Medicare PPO

## 2022-11-25 ENCOUNTER — Encounter: Payer: Self-pay | Admitting: Podiatry

## 2022-11-25 DIAGNOSIS — M2041 Other hammer toe(s) (acquired), right foot: Secondary | ICD-10-CM | POA: Diagnosis not present

## 2022-11-25 DIAGNOSIS — M2042 Other hammer toe(s) (acquired), left foot: Secondary | ICD-10-CM

## 2022-11-25 NOTE — Progress Notes (Signed)
Chief Complaint  Patient presents with   Wound Check    RM6: patient is here for foot wound from callouses on toes    SUBJECTIVE Patient presenting today for evaluation of symptomatic calluses/ulcers to the distal tips of the second and third toe bilateral.  Patient states that she is doing well but she continues to have pain associated to the distal tips of the toes.  She is concerned because the hammertoes are creating ulcers.  She does not go barefoot and wears good supportive shoes and sneakers.  She is not diabetic.  Past Medical History:  Diagnosis Date   Allergic rhinitis    Arthritis    Depression    Factor V Leiden carrier (HCC) 08/26/2011   Fatty liver    Fibroids    dysfuction uterine bleeding   GERD (gastroesophageal reflux disease)    Hypertension    Osteoarthritis    Peripheral neuropathy    Psoriatic arthritis (HCC)    Restless leg syndrome    Past Surgical History:  Procedure Laterality Date   CHOLECYSTECTOMY     KNEE SURGERY     right TKR   LUMBAR SPINE SURGERY     TUBAL LIGATION     bilateral   URETHRAL SLING     VAGINAL HYSTERECTOMY     Allergies  Allergen Reactions   Codeine Nausea Only   Diclofenac Sodium Other (See Comments)    Worsens RLS   Flonase [Fluticasone] Itching    OBJECTIVE General Patient is awake, alert, and oriented x 3 and in no acute distress. Derm mostly unchanged.  Ulcers noted to the  RT 3rd and LT 2nd toes each similar in presentation measuring approximately 0.2 x 0.2 x 0.2 cm.  There is no exposed bone.  Granular tissue noted along the wound base.  Periwound is callused.  There is some associated erythema and edema localized to the distal half of the left second toe  Vasc  DP and PT pedal pulses palpable bilaterally. Temperature gradient within normal limits.  Clinically no concern for vascular compromise Neuro light touch and protective threshold sensation diminished bilaterally.  Musculoskeletal Exam semireducible  hammertoe deformity noted to the lesser digits of the second third and fourth toes of the left foot.  The hammertoes to the second third and fourth right foot are more rigid.  The PIPJ of the second digit right foot is actually very rigid and immobile. Radiographic exam B/L feet 11/25/2022 hammertoe contracture deformity noted to the lesser digits bilateral.  Absence of the distal portion of the fifth metatarsal noted to the left foot.  No acute fractures identified.  ASSESSMENT 1.  Hammertoe deformity digits 2, 3, 4 bilateral.  The left foot hammertoes are reducible 2.  Stable chronic ulcers RT 3rd toe and LT 2nd toe distal tufts 3.  Cellulitis left second toe; improved 4.  Preulcerative callus lesions distal tips of the toes bilateral 5.  Patient is not diabetic  PLAN OF CARE -Patient evaluated -Medically necessary excisional debridement including subcutaneous tissue was performed to the left second toe and right third toe ulcers at the distal tips of the toes using a 312 scalpel.  Excisional debridement of the necrotic tissue down to healthier bleeding viable tissue was performed with postdebridement measurement same as pre- -unfortunately the patient continues to have pain and tenderness associated to the hammertoes bilateral.  She has tried offloading silicone toe crest pads and shoe gear modifications with no improvement. -I do believe the patient would benefit  from custom molded orthotics to support the medial longitudinal arch of the foot and offload pressure from the forefoot. -Order placed for orthotics -In regards to the hammertoes, I do believe the patient would benefit from hammertoe corrective surgery to offload pressure from the distal tips of the toes.  The left toes are very reducible and I believe that simple flexor tenotomy's of the second, third, fourth toes of the left foot would be beneficial to offload pressure from the tip of the toe.  This was discussed in detail with the  patient and she would like to pursue a more simple conservative approach.  The right foot hammertoes are more rigid and would require interphalangeal arthroplasty.  This was also explained in detail to the patient.  Risk benefits advantages and disadvantages of the procedures were explained.  No guarantees were expressed or implied.  Postoperative recovery course also explained in detail. -Authorization for surgery was initiated today.  Surgery will consist of PIPJ arthroplasty 2, 3, 4 right.  Flexor tenotomy 2, 3, 4 left -Return to clinic 1 week postop    Felecia Shelling, DPM [facility]  Dr. Felecia Shelling, DPM    [address] [place], [place] [address]                Office [phone]  Fax [phone]

## 2022-12-08 ENCOUNTER — Telehealth: Payer: Self-pay | Admitting: Podiatry

## 2022-12-08 NOTE — Telephone Encounter (Addendum)
UPDATED DOS- 03/25/2023  HAMMERTOE REPAIR 2-4 RT-28285 TENOTOMY 2-4 LT-28010  [employer] EFFECTIVE DATE- 03/31/19  OOP- $4,000 W/ REMAINING $2,582.06  COINSURANCE: 0%  PER [employer] WEBSITE FOR CPT CODE 16109 NO PRIOR AUTH REQUIRED. FOR CPT CODE 60454 HAS BEEN APPROVED, AUTH # [id number], GOOD FROM 02/28/2023 - 04/25/2023.  TRACKING #: [id number]  SPOKE WITH RIA G ( ON 02/23/2023) TO UPDATE THE APPROVED DATES OF SERVICE TO REFLECT PTS NEW SURGERY DATE OF 03/25/2023. AUTH IS NOW GOOD FROM 02/28/2023 - 04/25/2023.  CALL REFERENCE NUMBER: [id number]

## 2022-12-09 DIAGNOSIS — R300 Dysuria: Secondary | ICD-10-CM | POA: Diagnosis not present

## 2022-12-09 DIAGNOSIS — R350 Frequency of micturition: Secondary | ICD-10-CM | POA: Diagnosis not present

## 2022-12-14 DIAGNOSIS — I10 Essential (primary) hypertension: Secondary | ICD-10-CM | POA: Diagnosis not present

## 2022-12-14 DIAGNOSIS — F324 Major depressive disorder, single episode, in partial remission: Secondary | ICD-10-CM | POA: Diagnosis not present

## 2022-12-14 DIAGNOSIS — N289 Disorder of kidney and ureter, unspecified: Secondary | ICD-10-CM | POA: Diagnosis not present

## 2022-12-14 DIAGNOSIS — D849 Immunodeficiency, unspecified: Secondary | ICD-10-CM | POA: Diagnosis not present

## 2022-12-14 DIAGNOSIS — F419 Anxiety disorder, unspecified: Secondary | ICD-10-CM | POA: Diagnosis not present

## 2022-12-14 DIAGNOSIS — Z23 Encounter for immunization: Secondary | ICD-10-CM | POA: Diagnosis not present

## 2022-12-14 DIAGNOSIS — G2581 Restless legs syndrome: Secondary | ICD-10-CM | POA: Diagnosis not present

## 2022-12-14 DIAGNOSIS — I700 Atherosclerosis of aorta: Secondary | ICD-10-CM | POA: Diagnosis not present

## 2022-12-14 DIAGNOSIS — L4050 Arthropathic psoriasis, unspecified: Secondary | ICD-10-CM | POA: Diagnosis not present

## 2023-01-06 ENCOUNTER — Encounter: Payer: Medicare PPO | Admitting: Podiatry

## 2023-01-13 ENCOUNTER — Encounter: Payer: Medicare PPO | Admitting: Podiatry

## 2023-01-17 IMAGING — CR DG CHEST 2V
2 series · 2 of 2 positions shown · non-contrast
Comparison: November 22, 2014.

CLINICAL DATA: Dyspnea on exertion.

EXAM:
CHEST - 2 VIEW

[w chest pa]
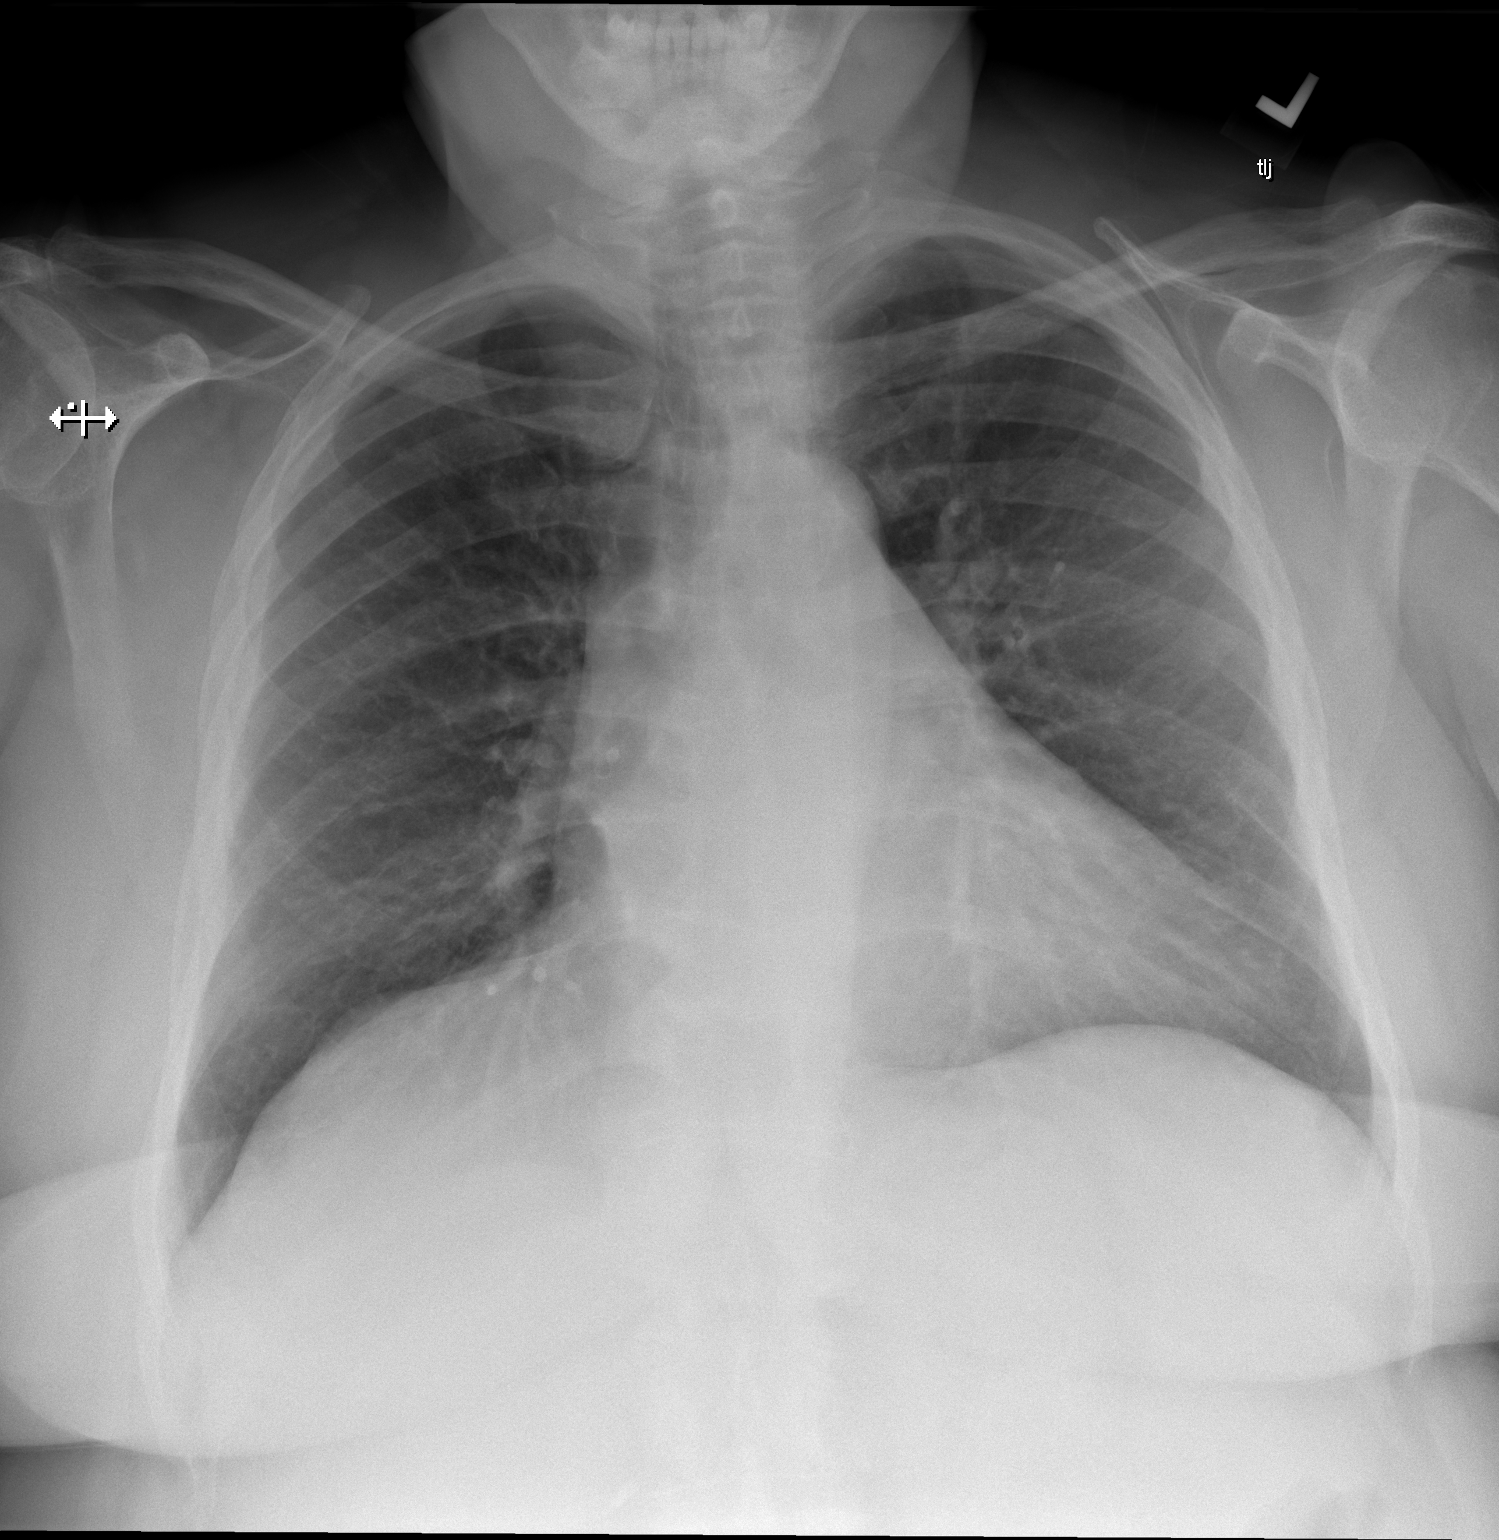

[w chest lat]
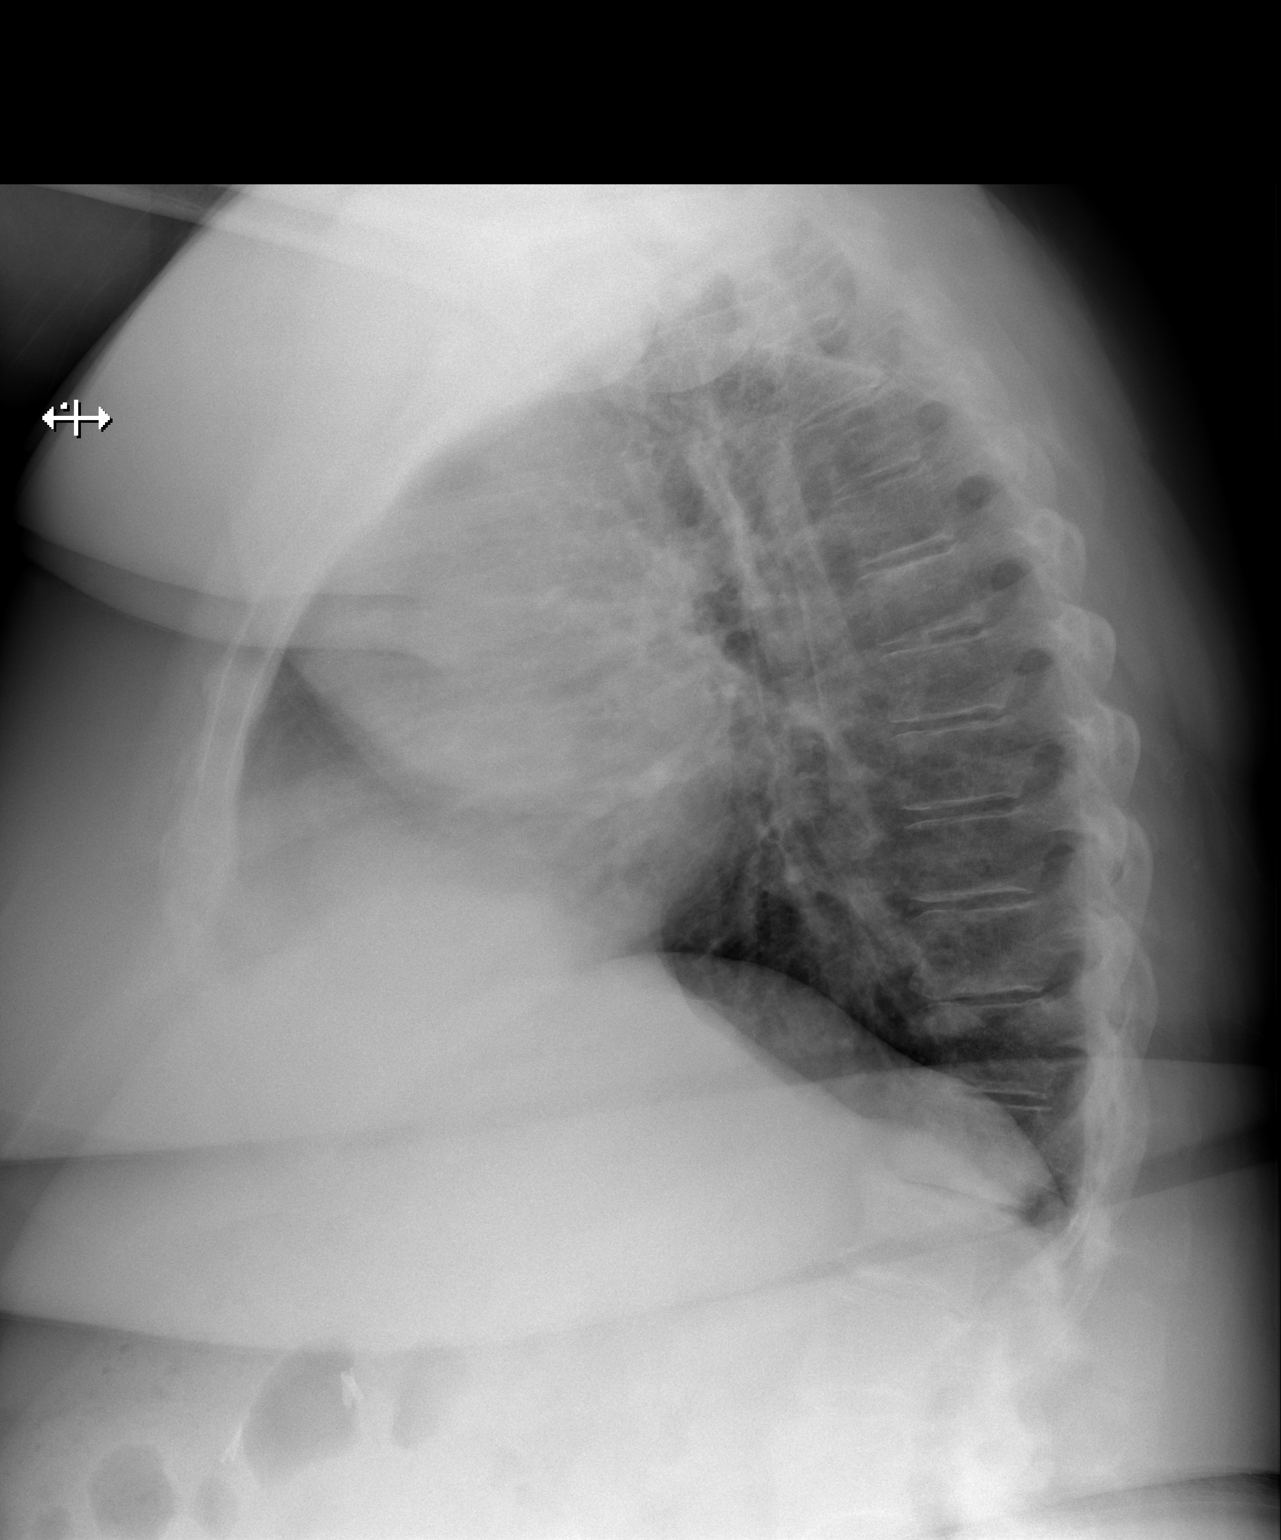

[2 of 2 positions shown; findings below may reference images not displayed]

FINDINGS: The heart size and mediastinal contours are within normal limits.
Both lungs are clear. The visualized skeletal structures are
unremarkable.
IMPRESSION: No active cardiopulmonary disease.

## 2023-01-18 ENCOUNTER — Telehealth: Payer: Self-pay | Admitting: Podiatry

## 2023-01-18 NOTE — Telephone Encounter (Signed)
Patient left VM that she wanted to cancel surgery. Reason: None given. Advised [facility] and Dr Logan Bores

## 2023-01-20 ENCOUNTER — Telehealth: Payer: Self-pay | Admitting: Podiatry

## 2023-01-20 NOTE — Telephone Encounter (Signed)
Pt has questions about the surgery you were to be preforming on her. She said she had stiff toes and she wanted to know what you said you were going to do to them. If she has nothing done what will happen to them. Is it a necessary procedure?

## 2023-01-27 ENCOUNTER — Encounter: Payer: Medicare PPO | Admitting: Podiatry

## 2023-02-03 ENCOUNTER — Encounter: Payer: Medicare PPO | Admitting: Podiatry

## 2023-02-08 ENCOUNTER — Encounter: Payer: Self-pay | Admitting: Podiatry

## 2023-02-08 ENCOUNTER — Ambulatory Visit: Payer: Medicare PPO | Admitting: Podiatry

## 2023-02-08 VITALS — Ht 64.0 in | Wt 264.6 lb

## 2023-02-08 DIAGNOSIS — L97522 Non-pressure chronic ulcer of other part of left foot with fat layer exposed: Secondary | ICD-10-CM

## 2023-02-08 DIAGNOSIS — M2042 Other hammer toe(s) (acquired), left foot: Secondary | ICD-10-CM | POA: Diagnosis not present

## 2023-02-08 DIAGNOSIS — M2041 Other hammer toe(s) (acquired), right foot: Secondary | ICD-10-CM

## 2023-02-08 NOTE — Progress Notes (Signed)
Chief Complaint  Patient presents with   Nail Problem    RFC    SUBJECTIVE Patient presenting today for evaluation of symptomatic calluses/ulcers to the distal tips of the second and third toe bilateral.  Patient states that she is doing well but she continues to have pain associated to the distal tips of the toes.  She is concerned because the hammertoes are creating ulcers.  She does not go barefoot and wears good supportive shoes and sneakers.  She is not diabetic.  Past Medical History:  Diagnosis Date   Allergic rhinitis    Arthritis    Depression    Factor V Leiden carrier (HCC) 08/26/2011   Fatty liver    Fibroids    dysfuction uterine bleeding   GERD (gastroesophageal reflux disease)    Hypertension    Osteoarthritis    Peripheral neuropathy    Psoriatic arthritis (HCC)    Restless leg syndrome    Past Surgical History:  Procedure Laterality Date   CHOLECYSTECTOMY     KNEE SURGERY     right TKR   LUMBAR SPINE SURGERY     TUBAL LIGATION     bilateral   URETHRAL SLING     VAGINAL HYSTERECTOMY     Allergies  Allergen Reactions   Codeine Nausea Only   Diclofenac Sodium Other (See Comments)    Worsens RLS   Flonase [Fluticasone] Itching    B/L feet 02/08/2023  OBJECTIVE General Patient is awake, alert, and oriented x 3 and in no acute distress. Derm mostly unchanged.  Ulcers noted to the  RT 3rd and LT 2nd toes each similar in presentation measuring approximately 0.2 x 0.2 x 0.2 cm.  There is no exposed bone.  Granular tissue noted along the wound base.  Periwound is callused.  There is some associated erythema and edema localized to the distal half of the left second toe  Vasc  DP and PT pedal pulses palpable bilaterally. Temperature gradient within normal limits.  Clinically no concern for vascular compromise Neuro light touch and protective threshold sensation diminished bilaterally.  Musculoskeletal Exam semireducible hammertoe deformity noted to the  lesser digits of the second third and fourth toes of the left foot.  The hammertoes to the second third and fourth right foot are more rigid.  The PIPJ of the second digit right foot is actually very rigid and immobile. Radiographic exam B/L feet 11/25/2022 hammertoe contracture deformity noted to the lesser digits bilateral.  Absence of the distal portion of the fifth metatarsal noted to the left foot.  No acute fractures identified.  ASSESSMENT 1.  Hammertoe deformity digits 2, 3, 4 bilateral.  The left foot hammertoes are reducible 2.  Stable chronic ulcers RT 3rd toe and LT 2nd toe distal tufts 3.  Cellulitis left second toe; improved 4.  Preulcerative callus lesions distal tips of the toes bilateral 5.  Patient is not diabetic  PLAN OF CARE -Patient evaluated -Medically necessary excisional debridement including subcutaneous tissue was performed to the left second toe and right third toe ulcers at the distal tips of the toes using a 312 scalpel.  Excisional debridement of the necrotic tissue down to healthier bleeding viable tissue was performed with postdebridement measurement same as pre- -unfortunately the patient continues to have pain and tenderness associated to the hammertoes bilateral.  She has tried offloading silicone toe crest pads and shoe gear modifications with no improvement. - Again today we discussed that the patient would benefit from custom molded  orthotics to support the medial longitudinal arch of the foot and offload pressure from the forefoot. - Orthotics pending -In regards to the hammertoes, again today we discussed in detail hammertoe corrective surgery to offload pressure from the distal tips of the toes.  The left toes are very reducible and I believe that simple flexor tenotomy's of the second, third, fourth toes of the left foot would be beneficial to offload pressure from the tip of the toe.  This was discussed today in detail with the patient and she would like to  pursue a more simple conservative approach.  The right foot hammertoes are more rigid and would require interphalangeal arthroplasty.  This was also explained in detail to the patient.  Risk benefits advantages and disadvantages of the procedures were explained.  No guarantees were expressed or implied.  Postoperative recovery course also explained in detail. -After discussing with the patient she agrees and would like to proceed with surgery. -Authorization for surgery was initiated last visit.  Surgery will consist of PIPJ arthroplasty 2, 3, 4 right.  Flexor tenotomy 2, 3, 4 left -Return to clinic 1 week postop    Felecia Shelling, DPM [facility]  Dr. Felecia Shelling, DPM    [address] [place], [place] [address]                Office [phone]  Fax [phone]

## 2023-02-17 ENCOUNTER — Encounter: Payer: Medicare PPO | Admitting: Podiatry

## 2023-03-02 DIAGNOSIS — Z6841 Body Mass Index (BMI) 40.0 and over, adult: Secondary | ICD-10-CM | POA: Diagnosis not present

## 2023-03-02 DIAGNOSIS — K130 Diseases of lips: Secondary | ICD-10-CM | POA: Diagnosis not present

## 2023-03-04 DIAGNOSIS — J029 Acute pharyngitis, unspecified: Secondary | ICD-10-CM | POA: Diagnosis not present

## 2023-03-04 DIAGNOSIS — U071 COVID-19: Secondary | ICD-10-CM | POA: Diagnosis not present

## 2023-03-04 DIAGNOSIS — R051 Acute cough: Secondary | ICD-10-CM | POA: Diagnosis not present

## 2023-03-04 DIAGNOSIS — R509 Fever, unspecified: Secondary | ICD-10-CM | POA: Diagnosis not present

## 2023-04-05 ENCOUNTER — Encounter: Payer: Medicare PPO | Admitting: Podiatry

## 2023-04-10 ENCOUNTER — Emergency Department (HOSPITAL_COMMUNITY): Payer: Medicare PPO

## 2023-04-10 ENCOUNTER — Other Ambulatory Visit: Payer: Self-pay

## 2023-04-10 ENCOUNTER — Encounter (HOSPITAL_COMMUNITY): Payer: Self-pay | Admitting: *Deleted

## 2023-04-10 ENCOUNTER — Emergency Department (HOSPITAL_COMMUNITY)
Admission: EM | Admit: 2023-04-10 | Discharge: 2023-04-10 | Disposition: A | Discharge status: Home / Self Care | Payer: Medicare PPO | Attending: Student | Admitting: Student

## 2023-04-10 DIAGNOSIS — R059 Cough, unspecified: Secondary | ICD-10-CM | POA: Insufficient documentation

## 2023-04-10 DIAGNOSIS — R0789 Other chest pain: Secondary | ICD-10-CM | POA: Insufficient documentation

## 2023-04-10 DIAGNOSIS — R051 Acute cough: Secondary | ICD-10-CM | POA: Diagnosis not present

## 2023-04-10 DIAGNOSIS — R062 Wheezing: Secondary | ICD-10-CM | POA: Diagnosis not present

## 2023-04-10 DIAGNOSIS — Z79899 Other long term (current) drug therapy: Secondary | ICD-10-CM | POA: Insufficient documentation

## 2023-04-10 DIAGNOSIS — I10 Essential (primary) hypertension: Secondary | ICD-10-CM | POA: Diagnosis not present

## 2023-04-10 DIAGNOSIS — R9431 Abnormal electrocardiogram [ECG] [EKG]: Secondary | ICD-10-CM | POA: Diagnosis not present

## 2023-04-10 DIAGNOSIS — I493 Ventricular premature depolarization: Secondary | ICD-10-CM | POA: Diagnosis not present

## 2023-04-10 DIAGNOSIS — J9801 Acute bronchospasm: Secondary | ICD-10-CM | POA: Diagnosis not present

## 2023-04-10 DIAGNOSIS — I4510 Unspecified right bundle-branch block: Secondary | ICD-10-CM | POA: Diagnosis not present

## 2023-04-10 DIAGNOSIS — R079 Chest pain, unspecified: Secondary | ICD-10-CM | POA: Diagnosis not present

## 2023-04-10 HISTORY — DX: Bradycardia, unspecified: R00.1

## 2023-04-10 LAB — CBC
HCT: 41.3 % (ref 36.0–46.0)
Hemoglobin: 13.8 g/dL (ref 12.0–15.0)
MCH: 30.1 pg (ref 26.0–34.0)
MCHC: 33.4 g/dL (ref 30.0–36.0)
MCV: 90 fL (ref 80.0–100.0)
Platelets: 222 10*3/uL (ref 150–400)
RBC: 4.59 MIL/uL (ref 3.87–5.11)
RDW: 15.1 % (ref 11.5–15.5)
WBC: 7.5 10*3/uL (ref 4.0–10.5)
nRBC: 0 % (ref 0.0–0.2)

## 2023-04-10 LAB — BASIC METABOLIC PANEL
Anion gap: 13 (ref 5–15)
BUN: 16 mg/dL (ref 8–23)
CO2: 24 mmol/L (ref 22–32)
Calcium: 9.5 mg/dL (ref 8.9–10.3)
Chloride: 102 mmol/L (ref 98–111)
Creatinine, Ser: 1.34 mg/dL — ABNORMAL HIGH (ref 0.44–1.00)
GFR, Estimated: 43 mL/min — ABNORMAL LOW (ref >60)
Glucose, Bld: 93 mg/dL (ref 70–99)
Potassium: 3.7 mmol/L (ref 3.5–5.1)
Sodium: 139 mmol/L (ref 135–145)

## 2023-04-10 LAB — TROPONIN I (HIGH SENSITIVITY)
Troponin I (High Sensitivity): 15 ng/L (ref <18)
Troponin I (High Sensitivity): 11 ng/L (ref <18)

## 2023-04-10 MED ORDER — IPRATROPIUM-ALBUTEROL 0.5-2.5 (3) MG/3ML IN SOLN
6.0000 mL | Freq: Once | RESPIRATORY_TRACT | Status: AC
  Administered 2023-04-10: 6 mL via RESPIRATORY_TRACT
  Filled 2023-04-10: qty 3

## 2023-04-10 MED ORDER — ALBUTEROL SULFATE HFA 108 (90 BASE) MCG/ACT IN AERS
1.0000 | INHALATION_SPRAY | Freq: Once | RESPIRATORY_TRACT | Status: AC
  Administered 2023-04-10: 1 via RESPIRATORY_TRACT
  Filled 2023-04-10: qty 6.7

## 2023-04-10 NOTE — ED Provider Notes (Signed)
 [facility] Provider Note  CSN: [id number] Arrival date & time: 04/10/23 1759  Chief Complaint(s) Chest Pain  HPI [name] [name] is a 68 y.o. female with PMH HTN, OSA, depression, hospital mission in April 2024 for septic shock secondary to pyelonephritis who presents emergency department for evaluation of cough, chest congestion and flulike symptoms.  Patient states that symptoms have been present for the last 2 days and she went to urgent care who tested her for COVID and flu.  These were negative but they found a new right bundle branch block on her EKG and because she was complaining of chest pressure she was sent to the emergency room for evaluation.  Here in the ER, she states that the pressure-like sensation is down to 1 out of 10 and currently denies shortness of breath, headache, fever, nausea, vomiting or other systemic symptoms.  No exertional component to the chest pressure and no associated diaphoresis.   Past Medical History Past Medical History:  Diagnosis Date   Allergic rhinitis    Arthritis    Bradycardia    Depression    Factor V Leiden carrier (HCC) 08/26/2011   Fatty liver    Fibroids    dysfuction uterine bleeding   GERD (gastroesophageal reflux disease)    Hypertension    Osteoarthritis    Peripheral neuropathy    Psoriatic arthritis (HCC)    Restless leg syndrome    Patient Active Problem List   Diagnosis Date Noted   Septic shock (HCC) 07/20/2022   Acute cystitis without hematuria 07/20/2022   Pulmonary hypertension, unspecified (HCC) 05/25/2019   Educated about COVID-19 virus infection 05/24/2019   Obstructive hypertrophic cardiomyopathy (HCC) 05/24/2019   OAB (overactive bladder) 09/29/2017   OSA (obstructive sleep apnea) 09/29/2017   Urge urinary incontinence 09/29/2017   Fibromyalgia 11/11/2016   Atrial septal hypertrophy 10/21/2015   Paresthesia of both feet 07/31/2015   Bradycardia 08/14/2014    Obesity 10/09/2013   Chest pain 10/08/2013   Factor V Leiden carrier (HCC) 08/26/2011   Protein S deficiency (HCC) 08/26/2011   MORBID OBESITY 06/06/2010   DEPRESSION 06/06/2010   RESTLESS LEG SYNDROME 06/06/2010   Essential hypertension 06/06/2010   Allergic rhinitis 06/06/2010   GERD 06/06/2010   FATTY LIVER DISEASE 06/06/2010   Osteoarthritis 06/06/2010   MYOFASCIAL PAIN SYNDROME 06/06/2010   EDEMA 06/06/2010   DYSPNEA 06/06/2010   CHEST PAIN UNSPECIFIED 06/06/2010   Home Medication(s) Prior to Admission medications   Medication Sig Start Date End Date Taking? Authorizing Provider  acetaminophen  (TYLENOL ) 325 MG tablet Take 650 mg by mouth every 6 (six) hours as needed for mild pain.    [provider]  albuterol  (VENTOLIN  HFA) 108 (90 Base) MCG/ACT inhaler Inhale 2 puffs into the lungs every 6 (six) hours as needed for shortness of breath. 03/28/21   Gladis Leonor HERO, MD  cetirizine (ZYRTEC) 10 MG tablet Take 10 mg by mouth as needed for allergies.    [provider]  cholecalciferol  (VITAMIN D3) 25 MCG (1000 UNIT) tablet Take 1,000 Units by mouth daily.    [provider]  doxycycline  (VIBRA -TABS) 100 MG tablet Take 1 tablet (100 mg total) by mouth 2 (two) times daily. 11/04/22   Janit Thresa HERO, DPM  DULoxetine  (CYMBALTA ) 60 MG capsule Take 60 mg by mouth 2 (two) times daily.    [provider]  fluconazole  (DIFLUCAN ) 150 MG tablet Take one tablet now and one after completion of antibiotics 07/24/22  Krishnan, Sendil K, MD  Ixekizumab (TALTZ) 80 MG/ML SOAJ Inject 80 mg as directed every 30 (thirty) days.    [provider]  losartan-hydrochlorothiazide  (HYZAAR) 50-12.5 MG tablet Take 1 tablet by mouth daily.  07/07/18   [provider]  Multiple Vitamins-Minerals (MULTIVITAL PO) Take 1 tablet by mouth daily.    [provider]  MYRBETRIQ  50 MG TB24 tablet Take 50 mg by mouth daily.  03/29/17   [provider]   pantoprazole  (PROTONIX ) 40 MG tablet Take 40 mg by mouth at bedtime.    [provider]  potassium chloride  SA (K-DUR) 20 MEQ tablet Take 1 tablet (20 mEq total) by mouth daily. Take one tablet twice a day for five days, then one tablet once a day Patient taking differently: Take 10 mEq by mouth daily. 08/02/18   Raford Lenis, MD  pramipexole  (MIRAPEX ) 0.75 MG tablet Take 0.75 mg by mouth at bedtime.    [provider]  pregabalin  (LYRICA ) 150 MG capsule Take 150 mg by mouth 2 (two) times daily. 01/11/20   [provider]  pregabalin  (LYRICA ) 75 MG capsule Take 75 mg by mouth at bedtime as needed (Pain). **In addition to 150 mg as needed    [provider]                                                                                                                                    Past Surgical History Past Surgical History:  Procedure Laterality Date   CHOLECYSTECTOMY     KNEE SURGERY     right TKR   LUMBAR SPINE SURGERY     TUBAL LIGATION     bilateral   URETHRAL SLING     VAGINAL HYSTERECTOMY     Family History Family History  Problem Relation Age of Onset   Coronary artery disease Father 22       deceased- TIAs   Stroke Father    Hypertension Mother 30       alive   Deep vein thrombosis Mother    Neuropathy Mother    Osteoporosis Mother    Liver disease Sister    Other Sister        ITP   Cancer Sister        breast   Heart failure Maternal Grandmother     Social History Social History   Tobacco Use   Smoking status: Never   Smokeless tobacco: Never  Vaping Use   Vaping status: Never Used  Substance Use Topics   Alcohol use: No    Alcohol/week: 0.0 standard drinks of alcohol   Drug use: No   Allergies Codeine, Diclofenac sodium, and Flonase  [fluticasone ]  Review of Systems Review of Systems  Respiratory:  Positive for cough and chest tightness.     Physical Exam Vital Signs  I have reviewed the triage vital  signs BP (!) 154/76 (BP Location: Right Arm)  Pulse (!) 109   Temp 98.7 F (37.1 C)   Resp 18   Ht 5' 4 (1.626 m)   Wt 120 kg   SpO2 99%   BMI 45.41 kg/m   Physical Exam Vitals and nursing note reviewed.  Constitutional:      General: She is not in acute distress.    Appearance: She is well-developed.  HENT:     Head: Normocephalic and atraumatic.  Eyes:     Conjunctiva/sclera: Conjunctivae normal.  Cardiovascular:     Rate and Rhythm: Normal rate and regular rhythm.     Heart sounds: No murmur heard. Pulmonary:     Effort: Pulmonary effort is normal. No respiratory distress.     Breath sounds: Wheezing present.  Abdominal:     Palpations: Abdomen is soft.     Tenderness: There is no abdominal tenderness.  Musculoskeletal:        General: No swelling.     Cervical back: Neck supple.  Skin:    General: Skin is warm and dry.     Capillary Refill: Capillary refill takes less than 2 seconds.  Neurological:     Mental Status: She is alert.  Psychiatric:        Mood and Affect: Mood normal.     ED Results and Treatments Labs (all labs ordered are listed, but only abnormal results are displayed) Labs Reviewed  BASIC METABOLIC PANEL - Abnormal; Notable for the following components:      Result Value   Creatinine, Ser 1.34 (*)    GFR, Estimated 43 (*)    All other components within normal limits  CBC  TROPONIN I (HIGH SENSITIVITY)  TROPONIN I (HIGH SENSITIVITY)                                                                                                                          Radiology DG Chest 2 View Result Date: 04/10/2023 CLINICAL DATA:  Chest pain for 2 days. EXAM: CHEST - 2 VIEW COMPARISON:  07/19/2022 FINDINGS: Stable upper normal heart size.The cardiomediastinal contours are normal. The lungs are clear. Pulmonary vasculature is normal. No consolidation, pleural effusion, or pneumothorax. No acute osseous abnormalities are seen. IMPRESSION: No active  cardiopulmonary disease. Electronically Signed   By: Andrea Gasman M.D.   On: 04/10/2023 18:56    Pertinent labs & imaging results that were available during my care of the patient were reviewed by me and considered in my medical decision making (see MDM for details).  Medications Ordered in ED Medications  ipratropium-albuterol  (DUONEB) 0.5-2.5 (3) MG/3ML nebulizer solution 6 mL (6 mLs Nebulization Given 04/10/23 2010)  Procedures Procedures  (including critical care time)  Medical Decision Making / ED Course   This patient presents to the ED for concern of chest tightness, this involves an extensive number of treatment options, and is a complaint that carries with it a high risk of complications and morbidity.  The differential diagnosis includes reactive airway disease, asthma exacerbation, COPD exacerbation, pneumonia, ACS, unspecified viral URI, COVID-19, influenza, RSV  MDM: Patient seen emergency room for evaluation of chest tightness.  Physical exam with some mild wheezing bilaterally but is otherwise unremarkable.  Laboratory evaluation with a creatinine of 1.34 but is otherwise unremarkable including negative high-sensitivity troponin and delta troponin.  Chest x-ray unremarkable.  ECG with a right bundle branch block but is otherwise unremarkable with no evidence of ischemia.  Patient given 2 DuoNebs and on reevaluation her chest tightness has completely resolved and she is feeling improved.  Patient COVID, flu, RSV negative at urgent care and thus this was not repeated.  Patient low risk by Wells criteria and I have low suspicion for PE.  With negative troponin testing and nonischemic ECG I have overall very low suspicion for ACS at this time.  Patient given an albuterol  inhaler for home use.  Given return precautions of which she voiced understanding  and she will be discharged with outpatient follow-up.  Currently does not meet inpatient criteria for admission.   Additional history obtained: -Additional history obtained from currently -External records from outside source obtained and reviewed including: Chart review including previous notes, labs, imaging, consultation notes   Lab Tests: -I ordered, reviewed, and interpreted labs.   The pertinent results include:   Labs Reviewed  BASIC METABOLIC PANEL - Abnormal; Notable for the following components:      Result Value   Creatinine, Ser 1.34 (*)    GFR, Estimated 43 (*)    All other components within normal limits  CBC  TROPONIN I (HIGH SENSITIVITY)  TROPONIN I (HIGH SENSITIVITY)      EKG   EKG Interpretation Date/Time:  Saturday April 10 2023 18:17:43 EST Ventricular Rate:  102 PR Interval:  156 QRS Duration:  128 QT Interval:  356 QTC Calculation: 463 R Axis:   269  Text Interpretation: Sinus tachycardia Right bundle branch block Abnormal ECG When compared with ECG of 20-Jul-2022 01:18, PREVIOUS ECG IS PRESENT Confirmed by Ewel Lona ([id number]) on 04/10/2023 8:19:51 PM         Imaging Studies ordered: I ordered imaging studies including chest x-ray I independently visualized and interpreted imaging. I agree with the radiologist interpretation   Medicines ordered and prescription drug management: Meds ordered this encounter  Medications   ipratropium-albuterol  (DUONEB) 0.5-2.5 (3) MG/3ML nebulizer solution 6 mL    -I have reviewed the patients home medicines and have made adjustments as needed  Critical interventions none    Cardiac Monitoring: The patient was maintained on a cardiac monitor.  I personally viewed and interpreted the cardiac monitored which showed an underlying rhythm of: NSR  Social Determinants of Health:  Factors impacting patients care include: nonre   Reevaluation: After the interventions noted above, I reevaluated the  patient and found that they have :improved  Co morbidities that complicate the patient evaluation  Past Medical History:  Diagnosis Date   Allergic rhinitis    Arthritis    Bradycardia    Depression    Factor V Leiden carrier (HCC) 08/26/2011   Fatty liver    Fibroids    dysfuction uterine bleeding  GERD (gastroesophageal reflux disease)    Hypertension    Osteoarthritis    Peripheral neuropathy    Psoriatic arthritis (HCC)    Restless leg syndrome       Dispostion: I considered admission for this patient, inpatient criteria for admission and she will be discharged with outpatient follow-up     Final Clinical Impression(s) / ED Diagnoses Final diagnoses:  None     @PCDICTATION @    Albertina Dixon, MD 04/11/23 1217

## 2023-04-10 NOTE — ED Notes (Signed)
 The pa was  notified to get the pt msed  Sharyl Nimrod will  see her

## 2023-04-10 NOTE — ED Triage Notes (Signed)
 The pt has a EKG with her from the urgent care [facility] her EKG there showed a rt bundle branch block that is ?? new

## 2023-04-10 NOTE — ED Provider Triage Note (Signed)
 Emergency Medicine Provider Triage Evaluation Note  [name] [name] , a 68 y.o. female  was evaluated in triage.  Pt complains of chest pain.  Review of Systems  Positive:  Negative:   Physical Exam  BP (!) 154/76 (BP Location: Right Arm)   Pulse (!) 109   Temp 98.7 F (37.1 C)   Resp 18   Ht 5' 4 (1.626 m)   Wt 120 kg   SpO2 99%   BMI 45.41 kg/m  Gen:   Awake, no distress   Resp:  Normal effort  MSK:   Moves extremities without difficulty  Other:    Medical Decision Making  Medically screening exam initiated at 7:07 PM.  Appropriate orders placed.  [name] [name] was informed that the remainder of the evaluation will be completed by another provider, this initial triage assessment does not replace that evaluation, and the importance of remaining in the ED until their evaluation is complete.  Chest pain intermittently for the past 2 days. Patient initially thought that it was bronchitis. Apparently UC sent patient here for changes on EKG.   [name] [name], [place] 04/10/23 1909

## 2023-04-10 NOTE — ED Triage Notes (Signed)
 The pt is c/o chest pain for 2 days  she was seen at an urgent care  [facility] and was told to come to the ed   the pt lives in [place]  some dizziness

## 2023-04-12 ENCOUNTER — Encounter: Payer: Medicare PPO | Admitting: Podiatry

## 2023-04-12 DIAGNOSIS — Z6841 Body Mass Index (BMI) 40.0 and over, adult: Secondary | ICD-10-CM | POA: Diagnosis not present

## 2023-04-12 DIAGNOSIS — I4510 Unspecified right bundle-branch block: Secondary | ICD-10-CM | POA: Diagnosis not present

## 2023-04-12 DIAGNOSIS — J069 Acute upper respiratory infection, unspecified: Secondary | ICD-10-CM | POA: Diagnosis not present

## 2023-04-21 ENCOUNTER — Telehealth: Payer: Self-pay | Admitting: Podiatry

## 2023-04-21 NOTE — Telephone Encounter (Signed)
Pt called to let us know that she has to cancel her 04/22/23 surgery with Dr.Evans @[facility]  , due to currently having an upper respiratory infection. Pt states that she will give [place] a call when she is ready to reschedule her surgery.

## 2023-04-26 ENCOUNTER — Encounter: Payer: Medicare PPO | Admitting: Podiatry

## 2023-04-28 ENCOUNTER — Encounter: Payer: Medicare PPO | Admitting: Podiatry

## 2023-04-30 DIAGNOSIS — L4059 Other psoriatic arthropathy: Secondary | ICD-10-CM | POA: Diagnosis not present

## 2023-04-30 DIAGNOSIS — L409 Psoriasis, unspecified: Secondary | ICD-10-CM | POA: Diagnosis not present

## 2023-04-30 DIAGNOSIS — Z79899 Other long term (current) drug therapy: Secondary | ICD-10-CM | POA: Diagnosis not present

## 2023-05-05 ENCOUNTER — Encounter: Payer: Medicare PPO | Admitting: Podiatry

## 2023-05-19 ENCOUNTER — Encounter: Payer: Medicare PPO | Admitting: Podiatry

## 2023-05-31 DIAGNOSIS — J0110 Acute frontal sinusitis, unspecified: Secondary | ICD-10-CM | POA: Diagnosis not present

## 2023-05-31 DIAGNOSIS — I129 Hypertensive chronic kidney disease with stage 1 through stage 4 chronic kidney disease, or unspecified chronic kidney disease: Secondary | ICD-10-CM | POA: Diagnosis not present

## 2023-05-31 DIAGNOSIS — N1831 Chronic kidney disease, stage 3a: Secondary | ICD-10-CM | POA: Diagnosis not present

## 2023-05-31 DIAGNOSIS — M7022 Olecranon bursitis, left elbow: Secondary | ICD-10-CM | POA: Diagnosis not present

## 2023-06-18 DIAGNOSIS — M47816 Spondylosis without myelopathy or radiculopathy, lumbar region: Secondary | ICD-10-CM | POA: Diagnosis not present

## 2023-06-18 DIAGNOSIS — G629 Polyneuropathy, unspecified: Secondary | ICD-10-CM | POA: Diagnosis not present

## 2023-06-18 DIAGNOSIS — E538 Deficiency of other specified B group vitamins: Secondary | ICD-10-CM | POA: Diagnosis not present

## 2023-06-18 DIAGNOSIS — Z0000 Encounter for general adult medical examination without abnormal findings: Secondary | ICD-10-CM | POA: Diagnosis not present

## 2023-06-18 DIAGNOSIS — F324 Major depressive disorder, single episode, in partial remission: Secondary | ICD-10-CM | POA: Diagnosis not present

## 2023-06-18 DIAGNOSIS — I700 Atherosclerosis of aorta: Secondary | ICD-10-CM | POA: Diagnosis not present

## 2023-06-18 DIAGNOSIS — N1831 Chronic kidney disease, stage 3a: Secondary | ICD-10-CM | POA: Diagnosis not present

## 2023-06-18 DIAGNOSIS — F419 Anxiety disorder, unspecified: Secondary | ICD-10-CM | POA: Diagnosis not present

## 2023-06-18 DIAGNOSIS — L4050 Arthropathic psoriasis, unspecified: Secondary | ICD-10-CM | POA: Diagnosis not present

## 2023-06-18 DIAGNOSIS — I129 Hypertensive chronic kidney disease with stage 1 through stage 4 chronic kidney disease, or unspecified chronic kidney disease: Secondary | ICD-10-CM | POA: Diagnosis not present

## 2023-08-11 DIAGNOSIS — R300 Dysuria: Secondary | ICD-10-CM | POA: Diagnosis not present

## 2023-08-11 DIAGNOSIS — N3001 Acute cystitis with hematuria: Secondary | ICD-10-CM | POA: Diagnosis not present

## 2023-08-19 DIAGNOSIS — B3731 Acute candidiasis of vulva and vagina: Secondary | ICD-10-CM | POA: Diagnosis not present

## 2023-08-19 DIAGNOSIS — N1831 Chronic kidney disease, stage 3a: Secondary | ICD-10-CM | POA: Diagnosis not present

## 2023-08-19 DIAGNOSIS — R300 Dysuria: Secondary | ICD-10-CM | POA: Diagnosis not present

## 2023-08-19 DIAGNOSIS — I129 Hypertensive chronic kidney disease with stage 1 through stage 4 chronic kidney disease, or unspecified chronic kidney disease: Secondary | ICD-10-CM | POA: Diagnosis not present

## 2023-09-30 DIAGNOSIS — L821 Other seborrheic keratosis: Secondary | ICD-10-CM | POA: Diagnosis not present

## 2023-09-30 DIAGNOSIS — L400 Psoriasis vulgaris: Secondary | ICD-10-CM | POA: Diagnosis not present

## 2023-09-30 DIAGNOSIS — D225 Melanocytic nevi of trunk: Secondary | ICD-10-CM | POA: Diagnosis not present

## 2023-09-30 DIAGNOSIS — L814 Other melanin hyperpigmentation: Secondary | ICD-10-CM | POA: Diagnosis not present

## 2023-10-18 DIAGNOSIS — F419 Anxiety disorder, unspecified: Secondary | ICD-10-CM | POA: Diagnosis not present

## 2023-10-18 DIAGNOSIS — I129 Hypertensive chronic kidney disease with stage 1 through stage 4 chronic kidney disease, or unspecified chronic kidney disease: Secondary | ICD-10-CM | POA: Diagnosis not present

## 2023-10-18 DIAGNOSIS — G2581 Restless legs syndrome: Secondary | ICD-10-CM | POA: Diagnosis not present

## 2023-10-18 DIAGNOSIS — N1831 Chronic kidney disease, stage 3a: Secondary | ICD-10-CM | POA: Diagnosis not present

## 2023-10-18 DIAGNOSIS — G4733 Obstructive sleep apnea (adult) (pediatric): Secondary | ICD-10-CM | POA: Diagnosis not present

## 2023-10-18 DIAGNOSIS — F324 Major depressive disorder, single episode, in partial remission: Secondary | ICD-10-CM | POA: Diagnosis not present

## 2023-10-26 DIAGNOSIS — L4059 Other psoriatic arthropathy: Secondary | ICD-10-CM | POA: Diagnosis not present

## 2023-10-26 DIAGNOSIS — Z79899 Other long term (current) drug therapy: Secondary | ICD-10-CM | POA: Diagnosis not present

## 2023-10-26 DIAGNOSIS — L409 Psoriasis, unspecified: Secondary | ICD-10-CM | POA: Diagnosis not present

## 2023-10-27 DIAGNOSIS — Z1231 Encounter for screening mammogram for malignant neoplasm of breast: Secondary | ICD-10-CM | POA: Diagnosis not present

## 2023-10-30 DIAGNOSIS — N390 Urinary tract infection, site not specified: Secondary | ICD-10-CM | POA: Diagnosis not present

## 2023-10-30 DIAGNOSIS — R300 Dysuria: Secondary | ICD-10-CM | POA: Diagnosis not present

## 2023-10-31 DIAGNOSIS — R300 Dysuria: Secondary | ICD-10-CM | POA: Diagnosis not present

## 2023-11-09 DIAGNOSIS — N1832 Chronic kidney disease, stage 3b: Secondary | ICD-10-CM | POA: Diagnosis not present

## 2023-11-09 DIAGNOSIS — I129 Hypertensive chronic kidney disease with stage 1 through stage 4 chronic kidney disease, or unspecified chronic kidney disease: Secondary | ICD-10-CM | POA: Diagnosis not present

## 2023-11-18 DIAGNOSIS — G629 Polyneuropathy, unspecified: Secondary | ICD-10-CM | POA: Diagnosis not present

## 2023-11-18 DIAGNOSIS — L4050 Arthropathic psoriasis, unspecified: Secondary | ICD-10-CM | POA: Diagnosis not present

## 2023-12-13 DIAGNOSIS — H43813 Vitreous degeneration, bilateral: Secondary | ICD-10-CM | POA: Diagnosis not present

## 2024-01-06 DIAGNOSIS — R32 Unspecified urinary incontinence: Secondary | ICD-10-CM | POA: Diagnosis not present

## 2024-01-06 DIAGNOSIS — D84821 Immunodeficiency due to drugs: Secondary | ICD-10-CM | POA: Diagnosis not present

## 2024-01-06 DIAGNOSIS — G629 Polyneuropathy, unspecified: Secondary | ICD-10-CM | POA: Diagnosis not present

## 2024-01-06 DIAGNOSIS — K219 Gastro-esophageal reflux disease without esophagitis: Secondary | ICD-10-CM | POA: Diagnosis not present

## 2024-01-06 DIAGNOSIS — L4050 Arthropathic psoriasis, unspecified: Secondary | ICD-10-CM | POA: Diagnosis not present

## 2024-01-06 DIAGNOSIS — F325 Major depressive disorder, single episode, in full remission: Secondary | ICD-10-CM | POA: Diagnosis not present

## 2024-01-06 DIAGNOSIS — N1831 Chronic kidney disease, stage 3a: Secondary | ICD-10-CM | POA: Diagnosis not present

## 2024-01-06 DIAGNOSIS — M1990 Unspecified osteoarthritis, unspecified site: Secondary | ICD-10-CM | POA: Diagnosis not present

## 2024-01-31 DIAGNOSIS — R300 Dysuria: Secondary | ICD-10-CM | POA: Diagnosis not present

## 2024-01-31 DIAGNOSIS — N390 Urinary tract infection, site not specified: Secondary | ICD-10-CM | POA: Diagnosis not present

## 2024-01-31 DIAGNOSIS — I10 Essential (primary) hypertension: Secondary | ICD-10-CM | POA: Diagnosis not present

## 2024-02-15 DIAGNOSIS — N1831 Chronic kidney disease, stage 3a: Secondary | ICD-10-CM | POA: Diagnosis not present

## 2024-02-15 DIAGNOSIS — M79602 Pain in left arm: Secondary | ICD-10-CM | POA: Diagnosis not present

## 2024-02-15 DIAGNOSIS — G2581 Restless legs syndrome: Secondary | ICD-10-CM | POA: Diagnosis not present

## 2024-02-15 DIAGNOSIS — I129 Hypertensive chronic kidney disease with stage 1 through stage 4 chronic kidney disease, or unspecified chronic kidney disease: Secondary | ICD-10-CM | POA: Diagnosis not present

## 2024-02-15 DIAGNOSIS — G4733 Obstructive sleep apnea (adult) (pediatric): Secondary | ICD-10-CM | POA: Diagnosis not present

## 2024-03-02 DIAGNOSIS — G2581 Restless legs syndrome: Secondary | ICD-10-CM | POA: Diagnosis not present

## 2024-03-02 DIAGNOSIS — N1831 Chronic kidney disease, stage 3a: Secondary | ICD-10-CM | POA: Diagnosis not present

## 2024-04-10 ENCOUNTER — Ambulatory Visit: Admitting: Podiatry

## 2024-04-10 VITALS — Ht 64.0 in

## 2024-04-10 DIAGNOSIS — M2042 Other hammer toe(s) (acquired), left foot: Secondary | ICD-10-CM | POA: Diagnosis not present

## 2024-04-10 DIAGNOSIS — M2041 Other hammer toe(s) (acquired), right foot: Secondary | ICD-10-CM | POA: Diagnosis not present

## 2024-04-17 NOTE — Progress Notes (Signed)
 "  Chief Complaint  Patient presents with   Callouses    RM 7 Patient is here for bilateral callus trim.    SUBJECTIVE Patient presenting today for evaluation of symptomatic calluses/ulcers to the distal tips of the second and third toe bilateral.  Patient states that she is doing well but she continues to have pain associated to the distal tips of the toes.  Last visit we had discussed correction of the hammertoe deformity.  Past Medical History:  Diagnosis Date   Allergic rhinitis    Arthritis    Bradycardia    Depression    Factor V Leiden carrier 08/26/2011   Fatty liver    Fibroids    dysfuction uterine bleeding   GERD (gastroesophageal reflux disease)    Hypertension    Osteoarthritis    Peripheral neuropathy    Psoriatic arthritis (HCC)    Restless leg syndrome    Past Surgical History:  Procedure Laterality Date   CHOLECYSTECTOMY     KNEE SURGERY     right TKR   LUMBAR SPINE SURGERY     TUBAL LIGATION     bilateral   URETHRAL SLING     VAGINAL HYSTERECTOMY     Allergies  Allergen Reactions   Codeine Nausea Only   Diclofenac Sodium Other (See Comments)    Worsens RLS   Flonase  [Fluticasone ] Itching    B/L feet 02/08/2023  OBJECTIVE General Patient is awake, alert, and oriented x 3 and in no acute distress. Derm mostly unchanged.  Ulcers noted to the  RT 3rd and LT 2nd toes each similar in presentation measuring approximately 0.2 x 0.2 x 0.2 cm.  There is no exposed bone.  Granular tissue noted along the wound base.  Periwound is callused.  There is some associated erythema and edema localized to the distal half of the left second toe  Vasc  DP and PT pedal pulses palpable bilaterally. Temperature gradient within normal limits.  Clinically no concern for vascular compromise Neuro light touch and protective threshold sensation diminished bilaterally.  Musculoskeletal Exam semireducible hammertoe deformity noted to the lesser digits of the second third and  fourth toes of the left foot.  The hammertoes to the second third and fourth right foot are more rigid.  The PIPJ of the second digit right foot is actually very rigid and immobile. Radiographic exam B/L feet 11/25/2022 hammertoe contracture deformity noted to the lesser digits bilateral.  Absence of the distal portion of the fifth metatarsal noted to the left foot.  No acute fractures identified.  ASSESSMENT 1.  Hammertoe deformity digits 2, 3, 4 bilateral.  The left foot hammertoes are reducible 2.  Stable chronic ulcers RT 3rd toe and LT 2nd toe distal tufts 3.  Cellulitis left second toe; improved 4.  Preulcerative callus lesions distal tips of the toes bilateral 5.  Patient is not diabetic  PLAN OF CARE -Patient evaluated -Medically necessary excisional debridement including subcutaneous tissue was performed to the left second toe and right third toe ulcers at the distal tips of the toes using a 312 scalpel.  Excisional debridement of the necrotic tissue down to healthier bleeding viable tissue was performed with postdebridement measurement same as pre- -unfortunately the patient continues to have pain and tenderness associated to the hammertoes bilateral.  She has tried offloading silicone toe crest pads and shoe gear modifications with no improvement. - Although surgery was discussed last visit patient states that she would like to continue to pursue conservative treatment  management -Return to clinic PRN   Thresa EMERSON Sar, DPM [facility]  Dr. Thresa EMERSON Sar, DPM    [address] [place], [place] [address]                Office [phone]  Fax [phone]        "
